# Patient Record
Sex: Female | Born: 1975 | Race: White | Hispanic: No | Marital: Single | Smoking: Never smoker
Health system: Southern US, Community
[De-identification: ages and names within clinical notes are randomized; demographics above are authoritative.]

## PROBLEM LIST (undated history)

## (undated) DIAGNOSIS — J040 Acute laryngitis: Secondary | ICD-10-CM

## (undated) DIAGNOSIS — R51 Headache: Secondary | ICD-10-CM

## (undated) DIAGNOSIS — T7840XA Allergy, unspecified, initial encounter: Secondary | ICD-10-CM

## (undated) DIAGNOSIS — N200 Calculus of kidney: Secondary | ICD-10-CM

## (undated) DIAGNOSIS — J40 Bronchitis, not specified as acute or chronic: Secondary | ICD-10-CM

## (undated) DIAGNOSIS — Z9109 Other allergy status, other than to drugs and biological substances: Secondary | ICD-10-CM

## (undated) HISTORY — DX: Calculus of kidney: N20.0

## (undated) HISTORY — DX: Headache: R51

## (undated) HISTORY — PX: WISDOM TOOTH EXTRACTION: SHX21

## (undated) HISTORY — DX: Allergy, unspecified, initial encounter: T78.40XA

## (undated) HISTORY — PX: UPPER GASTROINTESTINAL ENDOSCOPY: SHX188

---

## 2001-08-03 ENCOUNTER — Other Ambulatory Visit: Admission: RE | Admit: 2001-08-03 | Discharge: 2001-08-03 | Payer: Self-pay | Admitting: Obstetrics and Gynecology

## 2002-08-07 ENCOUNTER — Other Ambulatory Visit: Admission: RE | Admit: 2002-08-07 | Discharge: 2002-08-07 | Payer: Self-pay | Admitting: Obstetrics and Gynecology

## 2002-11-01 HISTORY — PX: NASAL SEPTUM SURGERY: SHX37

## 2003-10-16 ENCOUNTER — Other Ambulatory Visit: Admission: RE | Admit: 2003-10-16 | Discharge: 2003-10-16 | Payer: Self-pay | Admitting: Obstetrics and Gynecology

## 2004-11-01 HISTORY — PX: OTHER SURGICAL HISTORY: SHX169

## 2004-12-04 ENCOUNTER — Ambulatory Visit: Payer: Self-pay | Admitting: Internal Medicine

## 2004-12-11 ENCOUNTER — Ambulatory Visit: Payer: Self-pay | Admitting: Internal Medicine

## 2004-12-16 ENCOUNTER — Other Ambulatory Visit: Admission: RE | Admit: 2004-12-16 | Discharge: 2004-12-16 | Payer: Self-pay | Admitting: Obstetrics and Gynecology

## 2004-12-21 ENCOUNTER — Ambulatory Visit (HOSPITAL_COMMUNITY): Admission: RE | Admit: 2004-12-21 | Discharge: 2004-12-21 | Payer: Self-pay | Admitting: Internal Medicine

## 2004-12-29 ENCOUNTER — Ambulatory Visit: Payer: Self-pay | Admitting: Internal Medicine

## 2005-01-12 ENCOUNTER — Ambulatory Visit: Payer: Self-pay | Admitting: Internal Medicine

## 2005-07-27 ENCOUNTER — Ambulatory Visit: Payer: Self-pay | Admitting: Pulmonary Disease

## 2005-09-16 ENCOUNTER — Ambulatory Visit: Payer: Self-pay | Admitting: Internal Medicine

## 2005-11-24 ENCOUNTER — Ambulatory Visit: Payer: Self-pay | Admitting: Internal Medicine

## 2005-12-02 ENCOUNTER — Other Ambulatory Visit: Admission: RE | Admit: 2005-12-02 | Discharge: 2005-12-02 | Payer: Self-pay | Admitting: Obstetrics & Gynecology

## 2006-04-12 ENCOUNTER — Emergency Department (HOSPITAL_COMMUNITY): Admission: EM | Admit: 2006-04-12 | Discharge: 2006-04-12 | Payer: Self-pay | Admitting: Emergency Medicine

## 2006-08-11 ENCOUNTER — Ambulatory Visit: Payer: Self-pay | Admitting: Internal Medicine

## 2006-12-12 ENCOUNTER — Ambulatory Visit: Payer: Self-pay | Admitting: Internal Medicine

## 2007-01-25 ENCOUNTER — Other Ambulatory Visit: Admission: RE | Admit: 2007-01-25 | Discharge: 2007-01-25 | Payer: Self-pay | Admitting: Obstetrics & Gynecology

## 2007-05-01 ENCOUNTER — Ambulatory Visit: Payer: Self-pay | Admitting: Internal Medicine

## 2007-07-27 ENCOUNTER — Ambulatory Visit: Payer: Self-pay | Admitting: Internal Medicine

## 2007-10-16 ENCOUNTER — Ambulatory Visit: Payer: Self-pay | Admitting: Internal Medicine

## 2007-11-03 ENCOUNTER — Telehealth: Payer: Self-pay | Admitting: Internal Medicine

## 2007-11-13 ENCOUNTER — Ambulatory Visit: Payer: Self-pay | Admitting: Internal Medicine

## 2007-11-30 ENCOUNTER — Encounter: Payer: Self-pay | Admitting: Internal Medicine

## 2008-01-03 ENCOUNTER — Telehealth (INDEPENDENT_AMBULATORY_CARE_PROVIDER_SITE_OTHER): Payer: Self-pay | Admitting: *Deleted

## 2008-01-03 ENCOUNTER — Ambulatory Visit: Payer: Self-pay | Admitting: Internal Medicine

## 2008-01-05 ENCOUNTER — Telehealth (INDEPENDENT_AMBULATORY_CARE_PROVIDER_SITE_OTHER): Payer: Self-pay | Admitting: *Deleted

## 2008-02-19 ENCOUNTER — Other Ambulatory Visit: Admission: RE | Admit: 2008-02-19 | Discharge: 2008-02-19 | Payer: Self-pay | Admitting: Obstetrics & Gynecology

## 2008-04-30 ENCOUNTER — Ambulatory Visit: Payer: Self-pay | Admitting: Internal Medicine

## 2008-06-03 ENCOUNTER — Telehealth (INDEPENDENT_AMBULATORY_CARE_PROVIDER_SITE_OTHER): Payer: Self-pay | Admitting: *Deleted

## 2008-08-20 ENCOUNTER — Telehealth: Payer: Self-pay | Admitting: Internal Medicine

## 2008-09-16 ENCOUNTER — Telehealth (INDEPENDENT_AMBULATORY_CARE_PROVIDER_SITE_OTHER): Payer: Self-pay | Admitting: *Deleted

## 2008-09-16 ENCOUNTER — Ambulatory Visit: Payer: Self-pay | Admitting: Internal Medicine

## 2008-11-04 ENCOUNTER — Ambulatory Visit: Payer: Self-pay | Admitting: Internal Medicine

## 2008-11-28 ENCOUNTER — Telehealth (INDEPENDENT_AMBULATORY_CARE_PROVIDER_SITE_OTHER): Payer: Self-pay | Admitting: *Deleted

## 2008-12-18 ENCOUNTER — Ambulatory Visit: Payer: Self-pay | Admitting: Pulmonary Disease

## 2008-12-18 ENCOUNTER — Telehealth (INDEPENDENT_AMBULATORY_CARE_PROVIDER_SITE_OTHER): Payer: Self-pay | Admitting: *Deleted

## 2009-01-16 ENCOUNTER — Ambulatory Visit: Payer: Self-pay | Admitting: Internal Medicine

## 2009-01-24 ENCOUNTER — Ambulatory Visit: Payer: Self-pay | Admitting: Internal Medicine

## 2009-03-04 ENCOUNTER — Other Ambulatory Visit: Admission: RE | Admit: 2009-03-04 | Discharge: 2009-03-04 | Payer: Self-pay | Admitting: Obstetrics & Gynecology

## 2009-03-25 ENCOUNTER — Ambulatory Visit: Payer: Self-pay | Admitting: Internal Medicine

## 2009-06-26 ENCOUNTER — Telehealth: Payer: Self-pay | Admitting: Internal Medicine

## 2009-07-03 ENCOUNTER — Ambulatory Visit: Payer: Self-pay | Admitting: Internal Medicine

## 2009-11-04 ENCOUNTER — Ambulatory Visit: Payer: Self-pay | Admitting: Internal Medicine

## 2009-11-20 ENCOUNTER — Telehealth (INDEPENDENT_AMBULATORY_CARE_PROVIDER_SITE_OTHER): Payer: Self-pay | Admitting: *Deleted

## 2009-12-08 ENCOUNTER — Ambulatory Visit: Payer: Self-pay | Admitting: Internal Medicine

## 2009-12-08 ENCOUNTER — Telehealth (INDEPENDENT_AMBULATORY_CARE_PROVIDER_SITE_OTHER): Payer: Self-pay | Admitting: *Deleted

## 2010-01-20 ENCOUNTER — Ambulatory Visit: Payer: Self-pay | Admitting: Internal Medicine

## 2010-02-19 ENCOUNTER — Telehealth (INDEPENDENT_AMBULATORY_CARE_PROVIDER_SITE_OTHER): Payer: Self-pay | Admitting: *Deleted

## 2010-03-25 ENCOUNTER — Encounter: Admission: RE | Admit: 2010-03-25 | Discharge: 2010-03-25 | Payer: Self-pay | Admitting: Family Medicine

## 2010-05-06 ENCOUNTER — Telehealth: Payer: Self-pay | Admitting: Internal Medicine

## 2010-09-05 ENCOUNTER — Encounter: Payer: Self-pay | Admitting: Internal Medicine

## 2010-09-05 ENCOUNTER — Encounter: Admission: RE | Admit: 2010-09-05 | Discharge: 2010-09-05 | Payer: Self-pay | Admitting: Family Medicine

## 2010-09-07 ENCOUNTER — Ambulatory Visit: Payer: Self-pay | Admitting: Internal Medicine

## 2010-09-08 ENCOUNTER — Ambulatory Visit: Payer: Self-pay | Admitting: Internal Medicine

## 2010-10-21 ENCOUNTER — Telehealth: Payer: Self-pay | Admitting: Internal Medicine

## 2010-11-22 ENCOUNTER — Encounter: Payer: Self-pay | Admitting: Family Medicine

## 2010-12-03 NOTE — Assessment & Plan Note (Signed)
Summary: per katie//td   Primary Provider/Referring Provider:  Nilda Simmer  CC:  Accute Visit-head congestion, PND-yellow in color; Increased SOB, wheezing, and and Chest tightness.Morgan Wood  History of Present Illness: 01/24/09- Rhinosinusitis, asthmatic bronchits saw Dr Craige Cotta, given Qvar and Nasonex with question of sinusitis causing asthma flare. She has had tighter chest and dyspnea for last 2-3 weeks with some epistaxis. Denies obvious infection. Denies sinus pressure- pain. Min c/o is inability to take comfortable deep breath. FEV1/FVC was 0.84 on office spirometry last visit. She doesn't indicate increased stress. Needs to yawn more.  03/25/09- rhinosinusitis, asthmatic bronchitis Dyspnea is substantially improved since last visit. Went off Yaz around [place]. Noted she was taking a few deep breaths but not like before. has not had return of the distinct dyspnea she had been feeling. Denies chest pain, wheeze or tightness since off Yaz. She would like to go back on, or work with her GYN to find another hormonal management.  November 04, 2009- Rhinosinusitis, asthmatic bronchits Noting more nasal congestion, runny nose and wheeze. Wheezes especialy wih wood smoke from neighbors. She has been tight since Thanksgiving. Postnasal drip makes her nauseated. Xiphoid level discomfort from gastritis. Fever blister- asks Zovirax refill.  December 08, 2009- Rhinosinusitis, asthmatic bronchitis For 2 weeks head congestion, nose blowing, postnasal drip, scratchy throat. Was getting better but over last 2 days worse congestion. Pale yellow. Stomach ok so far. Mild muscle aches. Had flu vax. Had finsihed cefdinir and was feeling better until this most recent flare. For a few months has been noting intermittent ache or sharp pains around left lower costal margin to spine.   Current Medications (verified): 1)  Allergy Vaccine 1:10 Father Gives (W-E) 2)  Epipen 0.3 Mg/0.54ml Devi (Epinephrine) .... Severe Allergic  Reaction 3)  Proventil Hfa 108 (90 Base) Mcg/act Aers (Albuterol Sulfate) .... As Needed 4)  Birth Control .... Take 1 Tablet By Mouth Once A Day 5)  Cefdinir 300 Mg Caps (Cefdinir) .... 2 Daily 6)  Zovirax 400 Mg Tabs (Acyclovir) .Morgan Wood.. 1 Three Times A Day X 5 Days  Allergies (verified): 1)  ! 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  The patient denies fever, weight loss, weight gain, vision loss, decreased hearing, hoarseness, syncope, headaches, hemoptysis, and severe indigestion/heartburn.    Vital Signs:  Patient profile:   35 year old female Height:      62 inches Weight:      117.38 pounds BMI:     21.55 O2 Sat:      100 % on Room air Pulse rate:   76 / minute BP sitting:   108 / 62  (left arm) Cuff size:   regular  Vitals Entered By: Reynaldo Minium CMA (December 08, 2009 4:25 PM)  O2 Flow:  Room air  Physical Exam  Additional Exam:  General: A/Ox3; pleasant and cooperative, NAD, SKIN: no rash, lesions NODES: no lymphadenopathy HEENT: [name]/AT, EOM- WNL, Conjuctivae- clear, PERRLA, TM-WNL, Nose- clear, Throat- clear and wnl, Mellampatti  II NECK: Supple w/ fair ROM, JVD- none, normal  carotid impulses w/o bruits Thyroid-  CHEST: Clear to P&A HEART: RRR, no m/g/r heard ABDOMEN- slender. No palpable HS megaly, not tender. Not shock tender over lower spine. ZOX:WRUE, nl pulses, no edema  NEURO: Grossly intact to observation      Impression & Recommendations:  Problem # 1:  RHINOSINUSITIS, ACUTE  (ICD-461.8)  Viral syndrome with slow resolution. Possibly a second viral infection. I don't think additional antibiotic is indicated now, but that may change. We will give a neb and low dose prednisone taper. Encourage fluids. She had flu vax. I think she is describing a muxsculoskeltal pain- to be managed symptomatically, around her left lower rib cage. Her updated medication list for this problem includes:    Cefdinir 300 Mg Caps (Cefdinir) .Morgan Wood... 2 daily  Medications Added to Medication List This Visit: 1)  Prednisone 10 Mg Tabs (Prednisone) .... 3 x 2 days, 2 x 2 days, 1 x 2 days  Other Orders: Est. Patient Level III (45409)  Patient Instructions: 1)  Keep scheduled appointment- ealier or call as needed 2)  Fluids and rest, tylenol for aches. A heating pad might help your sore rib area. 3)  Script sent for prednisone taper 4)  neb neo nasal Prescriptions: PREDNISONE 10 MG TABS (PREDNISONE) 3 x 2 days, 2 x 2 days, 1 x 2 days  #12 x 0   Entered and Authorized by:   Waymon Budge MD   Signed by:   Waymon Budge MD on 12/08/2009   Method used:   [id number]       [address]       [place], [place]  [address]       Ph: [phone] or [phone]       Fax: [phone]   RxID:   [id number]

## 2010-12-03 NOTE — Progress Notes (Signed)
Summary: sob  Phone Note Call from Patient Call back at [phone]   Caller: Patient Call For: young Reason for Call: Talk to Nurse Summary of Call: pt. c/o sob since Sunday.  Cough, nose congested, real jittery. Initial call taken by: Eugene Gavia,  November 20, 2009 8:13 AM  Follow-up for Phone Call        [facility].Michel Bickers CMA  November 20, 2009 9:18 AM  The pt c/o increased sob, "wheezy" cough and chest disconfort since Sunday, 11/16/09. She denies any radiating chest pain or fever.The pt says she never took the Cefdinirgiven to her at the last OV. Please advise. Follow-up by: Michel Bickers CMA,  November 20, 2009 9:33 AM  Additional Follow-up for Phone Call Additional follow up Details #1::        Per CY, try mucinex DM for cough and to thin mucus.  Crystal Jones RN  November 20, 2009 10:30 AM  Pt is aware of CDY recs and will call if her sxs do not improve or if they get worse. She was instructed to go to the ER if her sob worsens.  Additional Follow-up by: Michel Bickers CMA,  November 20, 2009 10:51 AM

## 2010-12-03 NOTE — Progress Notes (Signed)
Summary: PRESCRIPT  Phone Note Call from Patient Call back at [phone]   Caller: Patient Call For: Morgan Wood Summary of Call: NEED REFILL FOR  1CC SYRINGES FOR ALLERGY SHOTS ALSO NEED PRESCRIPT FOR EFIE PEN [facility] [address] Initial call taken by: Rickard Patience,  February 19, 2010 8:49 AM  Follow-up for Phone Call        rx sent to pharmacy.  pt aware.  Aundra Millet Reynolds LPN  February 19, 2010 9:27 AM     New/Updated Medications: TUBERCULIN SYRINGE 25G X 5/8" 1 ML MISC (TUBERCULIN-ALLERGY SYRINGES) use as directed for allergy vaccine Prescriptions: EPIPEN 0.3 MG/0.3ML DEVI (EPINEPHRINE) severe allergic reaction  #1 x 11   Entered by:   Arman Filter LPN   Authorized by:   Waymon Budge MD   Signed by:   Arman Filter LPN on 53/66/4403   Method used:   [facility]        [facility] [id number]       [address]       [place], [place]  [address]       Ph: [phone] or [phone]       Fax: [phone]   RxID:   [id number] TUBERCULIN SYRINGE 25G X 5/8" 1 ML MISC (TUBERCULIN-ALLERGY SYRINGES) use as directed for allergy vaccine  #100 x 11   Entered by:   Arman Filter LPN   Authorized by:   Waymon Budge MD   Signed by:   Arman Filter LPN on 76/28/3151   Method used:   [facility]        [id number]       [address]       [place], [place]  [address]       Ph: [phone] or [phone]       Fax: [phone]   RxID:   [id number]

## 2010-12-03 NOTE — Assessment & Plan Note (Signed)
Summary: 12 months/apc   Primary Provider/Referring Provider:  Nilda Simmer  CC:  Pt here for yearly follow up and Rx refills. Pt c/o nasal congestion and runny nose, wheezing, and and headaches intermittent worse when neighbors "burn".  History of Present Illness: 01/24/09- Rhinosinusitis, asthmatic bronchits saw Dr Craige Cotta, given Qvar and Nasonex with question of sinusitis causing asthma flare. She has had tighter chest and dyspnea for last 2-3 weeks with some epistaxis. Denies obvious infection. Denies sinus pressure- pain. Min c/o is inability to take comfortable deep breath. FEV1/FVC was 0.84 on office spirometry last visit. She doesn't indicate increased stress. Needs to yawn more.  03/25/09- rhinosinusitis, asthmatic bronchitis Dyspnea is substantially improved since last visit. Went off Yaz around [place]. Noted she was taking a few deep breaths but not like before. has not had return of the distinct dyspnea she had been feeling. Denies chest pain, wheeze or tightness since off Yaz. She would like to go back on, or work with her GYN to find another hormonal management.  November 04, 2009- Rhinosinusitis, asthmatic bronchits Noting more nasal congestion, runny nose and wheeze. Wheezes especialy wih wood smoke from neighbors. She has been tight since Thanksgiving. Postnasal drip makes her nauseated. Xiphoid level discomfort from gastritis. Fever blister- asks Zovirax refill.   Current Medications (verified): 1)  Allergy Vaccine 1:10 Father Gives (W-E) 2)  Epipen 0.3 Mg/0.7ml Devi (Epinephrine) .... Severe Allergic Reaction 3)  Proventil Hfa 108 (90 Base) Mcg/act Aers (Albuterol Sulfate) .... As Needed 4)  Birth Control .... Take 1 Tablet By Mouth Once A Day  Allergies (verified): 1)  ! 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  The patient denies anorexia, fever, weight loss, weight gain, vision loss, decreased hearing, hoarseness, chest pain, syncope, dyspnea on exertion, peripheral edema, headaches, hemoptysis, and severe indigestion/heartburn.    Vital Signs:  Patient profile:   35 year old female Height:      62 inches Weight:      118.13 pounds BMI:     21.68 O2 Sat:      99 % on Room air Pulse rate:   83 / minute BP sitting:   106 / 70  (left arm) Cuff size:   regular  Vitals Entered By: Zackery Barefoot CMA (November 04, 2009 4:19 PM)  O2 Flow:  Room air CC: Pt here for yearly follow up and Rx refills. Pt c/o nasal congestion and runny nose, wheezing, and headaches intermittent worse when neighbors "burn" Comments Medications reviewed with patient Zackery Barefoot CMA  November 04, 2009 4:20 PM    Physical Exam  Additional Exam:  General: A/Ox3; pleasant and cooperative, NAD, SKIN: no rash, lesions NODES: no lymphadenopathy HEENT: [place]/AT, EOM- WNL, Conjuctivae- clear, PERRLA, TM-WNL, Nose- clear, Throat- clear and wnl, Mellampatti  II NECK: Supple w/ fair ROM, JVD- none, normal carotid impulses w/o bruits Thyroid-  CHEST: Clear to P&A HEART: RRR, no m/g/r heard ABDOMEN GNF:AOZH, nl pulses, no edema  NEURO: Grossly intact to observation      Impression & Recommendations:  Problem # 1:  RHINOSINUSITIS, ACUTE (ICD-461.8)  Recurrent low grade sinusitis. 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 The following medications were removed from the medication list:    Nasonex 50 Mcg/act Susp (Mometasone furoate) .Marland Kitchen... 2 sprays once daily Her updated  medication list for this problem includes:    Cefdinir 300 Mg Caps (Cefdinir) .Marland Kitchen... 2 daily  Problem # 2:  ASTHMATIC BRONCHITIS, ACUTE (ICD-466.0)  Not wheezing overtly now, but prone to worsen easily with viral infections Her updated medication list for this problem includes:    Proventil Hfa 108 (90 Base) Mcg/act Aers (Albuterol sulfate) .Marland Kitchen... As needed    Cefdinir 300 Mg Caps (Cefdinir) .Marland Kitchen... 2 daily  Medications Added to Medication List This Visit: 1)  Birth Control  .... Take 1 tablet by mouth once a day 2)  Cefdinir 300 Mg Caps (Cefdinir) .... 2 daily 3)  Zovirax 400 Mg Tabs (Acyclovir) .Marland Kitchen.. 1 three times a day x 5 days  Other Orders: Est. Patient Level II (81191)  Patient Instructions: 1)  Please schedule a follow-up appointment in 6 months. 2)  Sample Omnaris nasal spray: 2 puffs each nostril every night at bedtime 3)  Script cefdinir antibiotic to hold 4)  Scripts for Epipen and Zovirax 5)  Try the Neti pot early and often 6)  Try Sudafed-PE as a decongestant when needed. You can add mucinex if you need to this mucus Prescriptions: ZOVIRAX 400 MG TABS (ACYCLOVIR) 1 three times a day x 5 days  #15 x prn   Entered and Authorized by:   Waymon Budge MD   Signed by:   Waymon Budge MD on 11/04/2009   Method used:   [facility]        [id number]       [address]       [place], [place]  [address]       Ph: [phone] or [phone]       Fax: [phone]   RxID:   [id number] EPIPEN 0.3 MG/0.3ML DEVI (EPINEPHRINE) severe allergic reaction  #1 x prn   Entered and Authorized by:   Waymon Budge MD   Signed by:   Waymon Budge MD on 11/04/2009   Method used:   [facility]ically [facility]        [facility] [id number]       [address]       [place], [place]  [address]       Ph: [phone] or [phone]       Fax: [phone]   RxID:   [id number] CEFDINIR 300 MG CAPS  (CEFDINIR) 2 daily  #14 x 0   Entered and Authorized by:   Waymon Budge MD   Signed by:   Waymon Budge MD on 11/04/2009   Method used:   Print then Give to Patient   RxID:   [id number] PROVENTIL HFA 108 (90 BASE) MCG/ACT AERS (ALBUTEROL SULFATE) as needed  #1 x prn   Entered and Authorized by:   Waymon Budge MD   Signed by:   Waymon Budge MD on 11/04/2009   Method used:   [facility]        [id number]       [address]       [facility]  [place], [place]  [address]       Ph: [phone] or [phone]       Fax: [phone]   RxID:   [id number]    Immunization History:  Influenza Immunization History:    Influenza:  historical (09/01/2009)

## 2010-12-03 NOTE — Progress Notes (Signed)
Summary: chest pains  Phone Note Call from Patient Call back at [phone]   Caller: Patient Call For: young Summary of Call: pt still having breathing problem pain on left side of chest completed antibiotics [facility] [address] Initial call taken by: Rickard Patience,  December 08, 2009 8:18 AM  Follow-up for Phone Call        Pt c/o head congestion, PND-clear to yellow, S.O.B worse in the evening, wheezing, chest tight, left side pain radiating to back (intermittent-dull to sharp x 1 month). Pt states when taking abx (Cefdinir 300mg ) she did have some relief. Pt feels she needs to be seen by CY. Please advise. Thanks. Zackery Barefoot CMA  December 08, 2009 10:28 AM   Medication allergies:  1)  ! Zithromax Z-Pak (Azithromycin)   Additional Follow-up for Phone Call Additional follow up Details #1::        Please have pt come in to see CDY today at [phone] here at 4pm. Reynaldo Minium CMA  December 08, 2009 1:46 PM   pt aware of appt and will be here at 4pm Additional Follow-up by: Philipp Deputy CMA,  December 08, 2009 2:08 PM

## 2010-12-03 NOTE — Progress Notes (Signed)
Summary: nos appt  Phone Note Call from Patient   Caller: [email or web address]  Call For: Sara Selvidge Summary of Call: Rsc nos from 7/5 to 11/7 @ 4p. Initial call taken by: Darletta Moll,  May 06, 2010 9:35 AM

## 2010-12-03 NOTE — Assessment & Plan Note (Signed)
Summary: rov/jd   Primary Provider/Referring Provider:  Laurann Montana, MD  CC:  follow up visit-allergies.  History of Present Illness: November 04, 2009- Rhinosinusitis, asthmatic bronchits Noting more nasal congestion, runny nose and wheeze. Wheezes especialy wih wood smoke from neighbors. She has been tight since Thanksgiving. Postnasal drip makes her nauseated. Xiphoid level discomfort from gastritis. Fever blister- asks Zovirax refill.  December 08, 2009- Rhinosinusitis, asthmatic bronchitis For 2 weeks head congestion, nose blowing, postnasal drip, scratchy throat. Was getting better but over last 2 days worse congestion. Pale yellow. Stomach ok so far. Mild muscle aches. Had flu vax. Had finsihed cefdinir and was feeling better until this most recent flare. For a few months has been noting intermittent ache or sharp pains around left lower costal margin to spine.  September 07, 2010- Rhinosinusitis, asthmatic bronchitis Nurse-CC: follow up visit-allergies Needs flu vax and refill on her vaccine syringes. Continues allergy vaccine successfully. Does still recognize snifing and postnasal drip. Having persistent pain around left lower ribs to back and has been evaluated for it w/o reolution. Had persistent shortness of breath and urgent care gave her Qvar. She thinks she was anxious because chest hurt.  Dropped off Qvar and wants to discuss. Awakened one night last week by chest pain. Seemed to get relief from prednisone for awhile. Had MRI chest for this chest discomfort 2 days ago. MRI WNL    Preventive Screening-Counseling & Management  Alcohol-Tobacco     Smoking Status: never  Current Medications (verified): 1)  Allergy Vaccine 1:10 Father Gives (W-E) 2)  Epipen 0.3 Mg/0.41ml Devi (Epinephrine) .... Severe Allergic Reaction 3)  Proventil Hfa 108 (90 Base) Mcg/act Aers (Albuterol Sulfate) .... As Needed 4)  Birth Control .... Take 1 Tablet By Mouth Once A Day 5)  Zovirax 400 Mg  Tabs (Acyclovir) .Marland Kitchen.. 1 Three Times A Day X 5 Days 6)  Tuberculin Syringe 25g X 5/8" 1 Ml Misc (Tuberculin-Allergy Syringes) .... Use As Directed For Allergy Vaccine  Allergies (verified): 1)  ! 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  The patient denies shortness of breath with activity, shortness of breath at rest, productive cough, non-productive cough, coughing up blood, irregular heartbeats, acid heartburn, indigestion, loss of appetite, weight change, abdominal pain, difficulty swallowing, sore throat, tooth/dental problems, headaches, ear ache, rash, change in color of mucus, and fever.    Vital Signs:  Patient profile:   35 year old female Height:      62 inches Weight:      121.13 pounds BMI:     22.24 O2 Sat:      100 % on Room air Pulse rate:   77 / minute BP sitting:   110 / 72  (left arm) Cuff size:   regular  Vitals Entered By: Reynaldo Minium CMA (September 07, 2010 4:18 PM)  O2 Flow:  Room air CC: follow up visit-allergies   Physical Exam  Additional Exam:  General: A/Ox3; pleasant and cooperative, NAD, SKIN: no rash, lesions NODES: no lymphadenopathy HEENT: [place]/AT, EOM- WNL, Conjuctivae-  clear, PERRLA, TM-WNL, Nose- crusting mucus and turbinate edema, Throat- clear and wnl, Mallampati II NECK: Supple w/ fair ROM, JVD- none, normal carotid impulses w/o bruits Thyroid-  CHEST: Clear to P&A, no rubs cough or wheeze HEART: RRR, no m/g/r heard ABDOMEN- slender. No palpable HS megaly,  not tender.  ZOX:WRUE, nl pulses, no edema . Tends to sit slumped. NEURO: Grossly intact to observation      Impression & Recommendations:  Problem # 1:  RHINOSINUSITIS, ACUTE (ICD-461.8)  Ongoing rhinitis. We will continue allergy vaccine and let her try an antihistamine nasal spray since she couldn't make steroid inhalers work for her.  The following medications were removed from the medication list:    Cefdinir 300 Mg Caps (Cefdinir) .Marland Kitchen... 2 daily Her updated medication list for this problem includes:    Astepro 0.15 % Soln (Azelastine hcl) .Marland Kitchen... 1-2 puffs each nostril twice daily as needed antihistamine  Problem # 2:  ASTHMATIC BRONCHITIS, ACUTE (ICD-466.0)  Intermittent asthma or asthmatic bronchitis now clear. I don't know the basis for her c/o chest pain. Her habitual slumped sitting posture is suggestive, but the MRI doesn't recognize any obvious defect that would relate to this explanation. The following medications were removed from the medication list:    Cefdinir 300 Mg Caps (Cefdinir) .Marland Kitchen... 2 daily Her updated medication list for this problem includes:    Proventil Hfa 108 (90 Base) Mcg/act Aers (Albuterol sulfate) .Marland Kitchen... As needed  Medications Added to Medication List This Visit: 1)  Astepro 0.15 % Soln (Azelastine hcl) .Marland Kitchen.. 1-2 puffs each nostril twice daily as needed antihistamine  Other Orders: Est. Patient Level III (45409) Flu Vaccine 75yrs + (81191) Admin 1st Vaccine (47829)  Patient Instructions: 1)  Please schedule a follow-up appointment in 6 months. 2)  Refill script for allergy syringes 3)  Sample/ script to try Astepro antihistamine nasal spray 4)   1-2 puffs in each nostril two times a day as needed  5)  Flu vax Prescriptions: ASTEPRO 0.15 % SOLN (AZELASTINE HCL) 1-2 puffs each nostril twice daily as needed antihistamine  #1 x prn    Entered and Authorized by:   Waymon Budge MD   Signed by:   Waymon Budge MD on 09/07/2010   Method used:    Print then Give to Patient   RxID:   [id number] TUBERCULIN SYRINGE 25G X 5/8" 1 ML MISC (TUBERCULIN-ALLERGY SYRINGES) use as directed for allergy vaccine  #100 x 11   Entered by:   Reynaldo Minium CMA   Authorized by:   Waymon Budge MD   Signed by:   Reynaldo Minium CMA on 09/07/2010   Method used:   [id number]       [address]       [place], [place]  [address]       Ph: [phone] or [phone]       Fax: [phone]   RxID:   [id number]    Immunizations Administered:  Influenza Vaccine # 1:    Vaccine Type: Fluvax 3+    Site: left deltoid    Mfr: [employer]is    Dose: 0.5 ml    Route: IM    Given by: Reynaldo Minium CMA    Exp. Date: 04/02/2011    Lot #: [id number]    VIS given: 05/26/10 version given September 07, 2010.  Flu Vaccine Consent  Questions:    Do you have a history of severe allergic reactions to this vaccine? no    Any prior history of allergic reactions to egg and/or gelatin? no    Do you have a sensitivity to the preservative Thimersol? no    Do you have a past history of Guillan-Barre Syndrome? no    Do you currently have an acute febrile illness? no    Have you ever had a severe reaction to latex? no    Vaccine information given and explained to patient? yes    Are you currently pregnant? no

## 2010-12-03 NOTE — Progress Notes (Signed)
Summary: sinus issues  Phone Note Call from Patient Call back at [phone]   Caller: Patient Call For: young Reason for Call: Talk to Nurse Summary of Call: Patient c/o sinus drainage, sore throat, cough that is keeping her up at night.  She has tried alcheseltzer cough/sinus and netty pot with no relief.  Asking for any other suggestions to help with symptoms.  [facility] [address] Initial call taken by: Lehman Prom,  October 21, 2010 8:54 AM  Follow-up for Phone Call        [facility].Michel Bickers [facility]  October 21, 2010 9:13 AM  Patient returned a call from Triage. She can be reached at [phone] Follow-up by: Vedia Coffer,  October 21, 2010 9:17 AM  Additional Follow-up for Phone Call Additional follow up Details #1::        Pt c/o sore throat, cough w/ yellow sputum, wheezing, sinus drainage for 3 days. Pt has tried using her Netti Agricultural engineer cough and sinus OTC but this does not help. Pls advise. Allergies (verified):  1)  ! Zithromax Z-Pak (Azithromycin) Additional Follow-up by: Michel Bickers CMA,  October 21, 2010 9:29 AM    Additional Follow-up for Phone Call Additional follow up Details #2::    Per CDY-okay to give patient Augmentin 500mg  #14 take 1 by mouth two times a day no refills.Reynaldo Minium CMA  October 21, 2010 10:00 AM   Rx sent. pt aware. Carron Curie CMA  October 21, 2010 10:03 AM   New/Updated Medications: AUGMENTIN 500-125 MG TABS (AMOXICILLIN-POT CLAVULANATE) Take 1 tablet by mouth two times a day Prescriptions: AUGMENTIN 500-125 MG TABS (AMOXICILLIN-POT CLAVULANATE) Take 1 tablet by mouth two times a day  #14 x 0   Entered by:   Carron Curie CMA   Authorized by:   Waymon Budge MD   Signed by:   Carron Curie CMA on 10/21/2010   Method used:   [facility] to        [facility]  [address] [id number]       [address]       [place], [place]  [address]       Ph: [phone] or [phone]       Fax:  [phone]   RxID:   [id number]

## 2011-03-03 ENCOUNTER — Encounter: Payer: Self-pay | Admitting: Internal Medicine

## 2011-03-08 ENCOUNTER — Encounter: Payer: Self-pay | Admitting: Internal Medicine

## 2011-03-08 ENCOUNTER — Ambulatory Visit (INDEPENDENT_AMBULATORY_CARE_PROVIDER_SITE_OTHER): Payer: BC Managed Care – PPO | Admitting: Internal Medicine

## 2011-03-08 DIAGNOSIS — J209 Acute bronchitis, unspecified: Secondary | ICD-10-CM

## 2011-03-08 DIAGNOSIS — J301 Allergic rhinitis due to pollen: Secondary | ICD-10-CM

## 2011-03-08 DIAGNOSIS — J018 Other acute sinusitis: Secondary | ICD-10-CM

## 2011-03-08 MED ORDER — ALBUTEROL SULFATE HFA 108 (90 BASE) MCG/ACT IN AERS: 2.0000 | INHALATION_SPRAY | Freq: Four times a day (QID) | RESPIRATORY_TRACT | Status: DC | PRN

## 2011-03-08 NOTE — Assessment & Plan Note (Signed)
Allergy vaccine helps.

## 2011-03-08 NOTE — Progress Notes (Signed)
  Subjective:    Patient ID: Morgan Wood, female    DOB: 11/26/1975, 35 y.o.   MRN: [id number]  HPI 03/08/11- 6 yoF  Never smoker, followed for rhinosinusitis and asthmatic bronchitis.  Last here Sep 07, 2010. She got through the winter well. She credits her allergy shots for an unusually good spring, with little stuffiness. Taking only an occasional Z pak  if she plans to be outside.  She uses Qvar in stretches, but complains of cost. Asks about using her rescue inhaler instead.  Recognizes some of her chest tightness is due to anxiety.   Review of Systems See HPI Constitutional:   No weight loss, night sweats,  Fevers, chills, fatigue, lassitude. HEENT:   No headaches,  Difficulty swallowing,  Tooth/dental problems,  Sore throat,                No sneezing, itching, ear ache, nasal congestion, post nasal drip,   CV:  No chest pain,  Orthopnea, PND, swelling in lower extremities, anasarca, dizziness, palpitations  GI  No heartburn, indigestion, abdominal pain, nausea, vomiting, diarrhea, change in bowel habits, loss of appetite  Resp: No shortness of breath with exertion or at rest.  No excess mucus, no productive cough,  No non-productive cough,  No coughing up of blood.  No change in color of mucus.  No wheezing.   Skin: no rash or lesions.  GU: no dysuria, change in color of urine, no urgency or frequency.  No flank pain.  MS:  No joint pain or swelling.  No decreased range of motion.  No back pain.  Psych:  No change in mood or affect. No depression or anxiety.  No memory loss.      Objective:   Physical Exam General- Alert, Oriented, Affect-appropriate, Distress- none acute  Trim , comfortable appearing  Skin- rash-none, lesions- none, excoriation- none  Lymphadenopathy- none  Head- atraumatic  Eyes- Gross vision intact, PERRLA, conjunctivae clear  secretions  Ears- Hearing, canals, Tm ,- normal  Nose- Clear, No- septal dev, mucus, polyps, erosion, perforation    Throat- Mallampati II , mucosa clear , drainage- none, tonsils- atrophic  Neck- flexible , trachea midline, no stridor , thyroid nl, carotid no bruit  Chest - symmetrical excursion , unlabored     Heart/CV- RRR , no murmur , no gallop  , no rub, nl s1 s2                     - JVD- none , edema- none, stasis changes- none, varices- none     Lung- clear to P&A, wheeze- none, cough- none , dullness-none, rub- none     Chest wall-  Abd- tender-no, distended-no, bowel sounds-present, HSM- no  Br/ Gen/ Rectal- Not done, not indicated  Extrem- cyanosis- none, clubbing, none, atrophy- none, strength- nl  Neuro- grossly intact to observation         Assessment & Plan:

## 2011-03-08 NOTE — Patient Instructions (Signed)
Instead of Qvar, which is a maintenance med intended for daily use, try  Using your rescue albuterol inhaler just as needed. This can include use before you work outdoors or exercise if you need.   Script for proventil sent to drug store

## 2011-03-08 NOTE — Assessment & Plan Note (Signed)
Instead of spotty use of Qvar, she will do better to use a rescue inhaler occasionally as discussed. We will update her script.

## 2011-03-14 ENCOUNTER — Encounter: Payer: Self-pay | Admitting: Internal Medicine

## 2011-03-14 DIAGNOSIS — J301 Allergic rhinitis due to pollen: Secondary | ICD-10-CM | POA: Insufficient documentation

## 2011-03-16 NOTE — Assessment & Plan Note (Signed)
[facility]                             PULMONARY OFFICE NOTE   NECHELLE, PETRIZZO                      MRN:          [id number]  DATE:07/27/2007                            DOB:          [birth year]    PROBLEM:  1. Allergic asthma.  2. Allergic rhinitis.  3. Headache.   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  Two weeks now of what she considers seasonal head  congestion.  This morning some sore throat and chest congestion.  No  definite fever yet but she thinks she is headed towards bronchitis and a  flare of her asthma.  Nothing yet purulent.  Her father continues to  give her allergy vaccine at 1:10 with no problems.  She has an EpiPen  she has never needed.   MEDICATION:  1. PRN albuterol and Nasonex.  2. EpiPen available.   DRUG INTOLERANT:  Doxycycline with nausea.   OBJECTIVE:  Weight 107.8, BP 98/70, pulse 76, room air saturation 100%.  Pharynx is red.  Dry cough.  Without wheeze.  No adenopathy.  Mild nasal  congestion.   IMPRESSION:  Upper respiratory infection/viral  syndrome/bronchitis/asthma.   PLAN:  1. Z-Pak, fluids, supportive care and rest.  2. Prednisone 40 mg daily x3 days with steroid talk.  3. Schedule return 1 year, earlier p.r.n.     Clinton D. Maple Hudson, MD, Tonny Bollman, FACP  Electronically Signed    CDY/MedQ  DD: 07/31/2007  DT: 07/31/2007  Job #: [id number]   cc:   Nilda Simmer, M.D.

## 2011-03-16 NOTE — Assessment & Plan Note (Signed)
[facility]                         GASTROENTEROLOGY OFFICE NOTE   Morgan Wood, Morgan Wood                      MRN:          [id number]  DATE:11/13/2007                            DOB:          22-Oct-1976    Ms. Tomlin is a very nice 35 year old tall white female.  She is an  Tourist information centre manager who, for the past 4 weeks, has been  constipated.  Her usual bowel habits are 1 bowel movement a day.  For  the past 4 weeks, she has not gone very often and has to strain.  She  has developed left lower quadrant abdominal pain and discomfort, and she  has seen blood on the toilet tissue on 3 or 4 separate occasions.  The  last bleeding occurred about 2 weeks ago.  She denies changing any of  her eating habits or her lifestyle.  She usually has a granola bar in  the morning.  She brings her own lunch to school and eats regular supper  with balanced meal in the evenings.  Her weight has been stable.  She is  very physically active, playing on a basketball team 3 times a week and  softball team twice a week.  She has used over-the-counter laxatives  recently, as well as a stool softener.  There is no family history of  inflammatory bowel disease or colon cancer, or even constipation.  She  has never really had serious issues with her bowel habits.   MEDICATIONS:  1. Allergy shots once a week.  2. Birth control pills.  3. She also uses inhaler on p.r.n. basis.   PAST HISTORY:  Asthmatic bronchitis as a child.  Allergies.   FAMILY HISTORY:  Negative for colon cancer.  Positive for diabetes in  grandmother.  Both grandfathers died of heart attacks.   SOCIAL HISTORY:  Single.  No children.  4 years of college.  She works  as a Runner, broadcasting/film/video.  She does not smoke and does not drink alcohol.   REVIEW OF SYSTEMS:  Positive for allergies and muscle cramps.   PHYSICAL EXAM:  Blood pressure 112/64, pulse 76, and weight 107 pounds.  She appeared healthy and in no  distress, rather thin.  Sclerae not icteric.  NECK:  Supple.  LUNGS:  Clear to auscultation.  COR:  Normal S1, normal S2.  ABDOMEN:  Scaphoid, thin, with palpable stool in the left colon.  Moderate discomfort on palpation of the left middle quadrant.  Right  upper and lower quadrants were unremarkable.  RECTAL:  Anoscopic exam reveals normal-appearing anal area, normal  rectal tone.  No evidence of anal fissure.  No hemorrhoids.  Stool was  strongly Hemoccult positive.  Stool specimen was obtained after the  second insertion of the anoscope.   IMPRESSION:  52. A 35 year old white female with hematochezia, but normal anoscopic      exam except for heme-positive stool today, which could be related      to trauma with the anoscope.  Her description of the bleeding and      the circumstances are consistent with anal fissure.  I  do not see      any anal fissure today, but the last episode of bleeding was about      2 to 3 weeks ago.  2. Functional constipation, which may be related to inadequate fluid      intake or enough liquids, or enough fiber as well.   PLAN:  1. Booklet on high fiber diet.  The patient should follow instructions      to obtain at least 15 to 20 g of fiber a day.  2. MiraLax 9 to 10 g 2 to 3 times a week as a laxative.  3. Continue physical activity and increase fluid intake.  4. Analpram cream 2.5% to use on a p.r.n. basis for rectal irritation.  5. I would like to see her in the next 6 to 8 weeks.  We will recheck      the stool for blood and decide if she needs a colonoscopy.     Hedwig Morton. Juanda Chance, MD  Electronically Signed    DMB/MedQ  DD: 11/13/2007  DT: 11/13/2007  Job #: [id number]   cc:   M. Leda Quail, MD

## 2011-03-16 NOTE — Assessment & Plan Note (Signed)
[facility]                         GASTROENTEROLOGY OFFICE NOTE   SHAKEDRA, BEAM                      MRN:          [id number]  DATE:01/03/2008                            DOB:          12/08/1975    The patient is a 35 year old white female who had an anal fissure,  rectal bleeding, and constipation.  We saw her on November 13, 2007, and  put her on MiraLax, high fiber diet, and Analpram cream whose symptoms  have resolved.  She is now having regular bowel movements.  She denies  any rectal bleeding or rectal pain.  I do not feel there is a need for  further GI evaluation.   PLAN:  1. Continue high fiber diet.  2. Continue MiraLax on p.r.n. basis.  3. Return if her symptoms become refractory to the treatment.  At this      point we are not planning on colonoscopy.     Hedwig Morton. Juanda Chance, MD  Electronically Signed    DMB/MedQ  DD: 01/03/2008  DT: 01/03/2008  Job #: [id number]   cc:   M. Leda Quail, MD

## 2011-03-19 NOTE — Assessment & Plan Note (Signed)
 [facility]ALTHC[facility]                             PULMONARY OFFICE NOTE   Morgan Wood, Morgan Wood                      MRN:          [id number]  DATE:12/12/2006                            DOB:          1976-01-05    PROBLEM LIST:  1. Allergic asthma.  2. Allergic rhinitis.  3. Headaches.   HISTORY:  Nasal congestion off and on, sometimes with green nasal  discharge and a little bit of blood.  Some of her headaches are frontal  and could reflect sinusitis.  She has felt a little wheeze and chest  tightness.  She takes Zyrtec if she is visiting a place where she  expects there will be cats because she considers herself very sensitive  to this.  She continues allergy vaccine at 1:10 with injections given by  her father.  We again reviewed risks, benefit and goals including a  discussion of issues related to administration outside of a medical  office, anaphylaxis and epinephrine.  We refilled her epinephrine pen.   MEDICATIONS:  1. Allergy injections once per week.  2. Rescue albuterol.  3. Nasonex.  4. Epinephrine pen.   DRUG INTOLERANCE:  To DOXYCYCLINE which causes GI upset.   OBJECTIVE:  Weight 114 pounds.  BP 98/60, pulse regular 66.  Room air  saturation 98%.  There is moderate nasal congestion and turbinate edema  with white mucus in the nose and little in the posterior pharynx but no  erythema.  Voice quality is normal.  I find no adenopathy.  Chest is  quiet and clear.  Heart sounds are normal.   IMPRESSION:  1. Allergic rhinitis with a recent upper respiratory tract infection      and possibly sinusitis.  2. Mild intermittent asthma.   PLAN:  1. Vaccine discussion as above.  2. Augmentin 875 mg b.i.d. for 7 days.  3. Saline nasal lavage.  4. Refilled albuterol rescue inhaler.  5. Schedule return in one year but earlier p.r.n.     Clinton D. Maple Hudson, MD, Tonny Bollman, FACP  Electronically Signed    CDY/MedQ  DD: 12/18/2006  DT: 12/19/2006  Job  #: [id number]   cc:   Nilda Simmer, M.D.

## 2011-04-12 ENCOUNTER — Telehealth: Payer: Self-pay | Admitting: Internal Medicine

## 2011-04-12 NOTE — Telephone Encounter (Signed)
I'm very sorry but I don't know about this. I don't remember a form. I don't find anything in my paperwork folders.  We would usually have dealt with it and sent it along as requested. If we lost it i can only apologize and ask her to send another, or explain what she needs [place] to do. Would it have been something sent to the allergy lab??

## 2011-04-12 NOTE — Telephone Encounter (Signed)
Spoke with pt.  She states that when she was here last on 03/08/11 gave CDY a form to fill out regarding allergy med.  She is upset b/c she states that she should have received something by now. Dr Maple Hudson pls advise the status of this form, thanks!

## 2011-04-13 ENCOUNTER — Ambulatory Visit (INDEPENDENT_AMBULATORY_CARE_PROVIDER_SITE_OTHER): Payer: BC Managed Care – PPO

## 2011-04-13 DIAGNOSIS — J309 Allergic rhinitis, unspecified: Secondary | ICD-10-CM

## 2011-04-13 NOTE — Telephone Encounter (Signed)
LMTCB

## 2011-04-13 NOTE — Telephone Encounter (Signed)
Spoke with pt and advised of response per CDY- pt very upset. I put her on hold and went to check with Morgan Wood. Phone call transferred to [name] per [place] request. Will hold in triage until I hear back from [name].

## 2011-09-06 ENCOUNTER — Ambulatory Visit (INDEPENDENT_AMBULATORY_CARE_PROVIDER_SITE_OTHER): Payer: BC Managed Care – PPO | Admitting: Internal Medicine

## 2011-09-06 ENCOUNTER — Encounter: Payer: Self-pay | Admitting: Internal Medicine

## 2011-09-06 VITALS — BP 118/62 | HR 72 | Ht 63.0 in | Wt 120.8 lb

## 2011-09-06 DIAGNOSIS — J018 Other acute sinusitis: Secondary | ICD-10-CM

## 2011-09-06 DIAGNOSIS — J301 Allergic rhinitis due to pollen: Secondary | ICD-10-CM

## 2011-09-06 MED ORDER — ALBUTEROL SULFATE HFA 108 (90 BASE) MCG/ACT IN AERS: 2.0000 | INHALATION_SPRAY | RESPIRATORY_TRACT | Status: DC | PRN

## 2011-09-06 MED ORDER — EPINEPHRINE 0.3 MG/0.3ML IJ DEVI: 0.3000 mg | Freq: Once | INTRAMUSCULAR | Status: DC

## 2011-09-06 MED ORDER — VALACYCLOVIR HCL 1 G PO TABS: ORAL_TABLET | ORAL | Status: AC

## 2011-09-06 NOTE — Progress Notes (Signed)
Patient ID: Morgan Wood, female    DOB: 1976/03/26, 35 y.o.   MRN: [id number]  HPI 03/08/11- 64 yoF  Never smoker, followed for rhinosinusitis and asthmatic bronchitis.  Last here Sep 07, 2010. She got through the winter well. She credits her allergy shots for an unusually good spring, with little stuffiness. Taking only an occasional Z pak  if she plans to be outside.  She uses Qvar in stretches, but complains of cost. Asks about using her rescue inhaler instead.  Recognizes some of her chest tightness is due to anxiety.   09/06/11- 35 yoF  Never smoker, followed for rhinosinusitis and asthmatic bronchitis.  She should cough one head cold earlier in the summer but since then has felt well. 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 She is doing well now with allergy vaccine at 1:10 and she is not interested in making any changes. She does use Neti pot when needed.   Review of Systems See HPI Constitutional:   No-   weight loss, night sweats, fevers, chills, fatigue, lassitude. HEENT:   No-  headaches, difficulty swallowing, tooth/dental problems, sore throat,       No-routine  sneezing, itching, ear ache, nasal congestion, post nasal drip,  CV:  No-   chest pain, orthopnea, PND, swelling in lower extremities, anasarca, dizziness, palpitations Resp: No-   shortness of breath with exertion or at rest.              No-   productive cough,  No non-productive cough,  No- coughing up of blood.              No-   change in color of mucus.  No- wheezing.   Skin: No-   rash or lesions. GI:  No-   heartburn, indigestion, abdominal pain, nausea, vomiting, diarrhea,                 change in bowel habits, loss of appetite GU: No-   dysuria, change in color of urine, no urgency or frequency.  No- flank pain. MS:  No-   joint pain or swelling.  No- decreased range of motion.  No- back pain. Neuro-      nothing unusual Psych:  No- change in mood or affect. No depression or anxiety.  No memory loss.      Objective:   Physical Exam General- Alert, Oriented, Affect-appropriate, Distress- none acute Skin- rash-none, lesions- none, excoriation- none Lymphadenopathy- none Head- atraumatic            Eyes- Gross vision intact, PERRLA, conjunctivae clear secretions            Ears- Hearing, canals-normal            Nose- thick white mucus bridging, no-Septal dev, polyps, erosion, perforation             Throat- Mallampati II , mucosa clear , drainage- none, tonsils- atrophic Neck- flexible , trachea midline, no stridor , thyroid nl, carotid no bruit Chest - symmetrical excursion , unlabored           Heart/CV- RRR , no murmur , no gallop  , no rub, nl s1 s2                           - JVD- none , edema- none, stasis changes-  none, varices- none           Lung- clear to P&A, wheeze- none, cough- none , dullness-none, rub- none           Chest wall-  Abd- tender-no, distended-no, bowel sounds-present, HSM- no Br/ Gen/ Rectal- Not done, not indicated Extrem- cyanosis- none, clubbing, none, atrophy- none, strength- nl Neuro- grossly intact to observation

## 2011-09-06 NOTE — Assessment & Plan Note (Signed)
Recent upper respiratory infection self-limited. She has rhinitis on an ongoing basis but I don't think she has a sinus infection. We discussed saline lavage and supportive measures.

## 2011-09-06 NOTE — Assessment & Plan Note (Signed)
She is quite satisfied to continue allergy vaccine and credits forward to seeing the number of acute respiratory illnesses.

## 2011-09-06 NOTE — Patient Instructions (Signed)
Flu vax  Refill scripts sent  Continue present treatment- please call as needed

## 2011-11-10 ENCOUNTER — Telehealth: Payer: Self-pay | Admitting: Internal Medicine

## 2011-11-10 MED ORDER — OSELTAMIVIR PHOSPHATE 75 MG PO CAPS: 75.0000 mg | ORAL_CAPSULE | Freq: Two times a day (BID) | ORAL | Status: AC

## 2011-11-10 NOTE — Telephone Encounter (Signed)
Per [facility]-okay to give Tamiflu 75 mg 310 take 1 po bid no refills.

## 2011-11-10 NOTE — Telephone Encounter (Signed)
Last ov with CDY 11.5.12, upcoming 5.9.13.  LMOM TCB x1.

## 2011-11-10 NOTE — Telephone Encounter (Signed)
I spoke with pt and is aware of cdy recs. rx has been called [facility] [address] since e prescribe is down.

## 2011-11-10 NOTE — Telephone Encounter (Signed)
Spoke with pt. She c/o fever (up to 101 last night), body aches runny nose x 2 days. She states that she had some cough this am that was prod with very minimal yellow sputum. She is taking nyquil and this has only helped some. No openings with CDY today. Please advise, thanks! Allergies  Allergen Reactions  . Azithromycin     REACTION: Stomach cramps

## 2011-11-24 ENCOUNTER — Telehealth: Payer: Self-pay | Admitting: Internal Medicine

## 2011-11-24 MED ORDER — DOXYCYCLINE HYCLATE 100 MG PO TABS: ORAL_TABLET | ORAL | Status: AC

## 2011-11-24 NOTE — Telephone Encounter (Signed)
I spoke with the the pt and she states x 2 weeks ago she called in and was given tamiflu and this helped her symptoms at that time, but x 4 days she has began to have chest congestion, productive cough with yellow phlegm, sore throat, ear pain. She states she does not feel that same as she did 2 weeks ago.  She denies any fever at this time or head congestion.  She has not tried anything OTC. No appts available today. Please advise. Carron Curie, CMA Allergies  Allergen Reactions  . Azithromycin     REACTION: Stomach cramps

## 2011-11-24 NOTE — Telephone Encounter (Signed)
Per CDY- call in doxycycline 100 mg #8  Spoke with pt and notified of this and she verbalized understanding.  Rx was sent to pharm.

## 2011-12-02 ENCOUNTER — Ambulatory Visit (INDEPENDENT_AMBULATORY_CARE_PROVIDER_SITE_OTHER): Payer: BC Managed Care – PPO

## 2011-12-02 DIAGNOSIS — J309 Allergic rhinitis, unspecified: Secondary | ICD-10-CM

## 2012-03-09 ENCOUNTER — Ambulatory Visit: Payer: BC Managed Care – PPO | Admitting: Internal Medicine

## 2012-03-13 ENCOUNTER — Encounter: Payer: Self-pay | Admitting: Internal Medicine

## 2012-03-13 ENCOUNTER — Ambulatory Visit (INDEPENDENT_AMBULATORY_CARE_PROVIDER_SITE_OTHER): Payer: BC Managed Care – PPO | Admitting: Internal Medicine

## 2012-03-13 VITALS — BP 118/78 | HR 70 | Ht 63.0 in | Wt 117.4 lb

## 2012-03-13 DIAGNOSIS — J301 Allergic rhinitis due to pollen: Secondary | ICD-10-CM

## 2012-03-13 DIAGNOSIS — J209 Acute bronchitis, unspecified: Secondary | ICD-10-CM

## 2012-03-13 MED ORDER — EPINEPHRINE 0.3 MG/0.3ML IJ DEVI: 0.3000 mg | Freq: Once | INTRAMUSCULAR | Status: DC

## 2012-03-13 NOTE — Patient Instructions (Signed)
Refill script for Epipen  Sample Tudorza inhaler       1 puff, twice daily

## 2012-03-13 NOTE — Progress Notes (Signed)
Patient ID: Morgan Wood, female    DOB: [birth year], 36 y.o.   MRN: [id number]  HPI 03/08/11- 30 yoF  Never smoker, followed for rhinosinusitis and asthmatic bronchitis.  Last here Sep 07, 2010. She got through the winter well. She credits her allergy shots for an unusually good spring, with little stuffiness. Taking only an occasional Z pak  if she plans to be outside.  She uses Qvar in stretches, but complains of cost. Asks about using her rescue inhaler instead.  Recognizes some of her chest tightness is due to anxiety.   09/06/11- 35 yoF  Never smoker, followed for rhinosinusitis and asthmatic bronchitis.  She should cough one head cold earlier in the summer but since then has felt well. 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 She is doing well now with allergy vaccine at 1:10 and she is not interested in making any changes. She does use Neti pot when needed.   03/13/12- 35 yoF  Never smoker, followed for rhinosinusitis and asthmatic bronchitis. Wheezing is worse at night when laying down. Incidentally noted increased wheeze over the last 2 months. Chronic pressure sensation under her lower left anterior costal margin. We discussed anatomy in that area. Her primary physician gave gabapentin "to calm my nerves", and I think she considers herself an anxious person.  Review of Systems-See HPI Constitutional:   No-   weight loss, night sweats, fevers, chills, fatigue, lassitude. HEENT:   No-  headaches, difficulty swallowing, tooth/dental problems, sore throat,       No-routine  sneezing, itching, ear ache, nasal congestion, post nasal drip,  CV:  No-   chest pain, orthopnea, PND, swelling in lower extremities, anasarca, dizziness, palpitations Resp: No-   shortness of breath with exertion or at rest.              No-   productive cough,  No non-productive cough,  No- coughing up of blood.        No-   change in color of mucus.  + wheezing.   Skin: No-   rash or lesions. GI:  No-   heartburn, indigestion, abdominal pain, nausea, vomiting,  GU:  MS:  No-   joint pain or swelling.   Neuro-     nothing unusual Psych:  No- change in mood or affect. No depression or anxiety.  No memory loss.      Objective:   Physical Exam General- Alert, Oriented, Affect-appropriate, Distress- none acute Skin- rash-none, lesions- none, excoriation- none Lymphadenopathy- none Head- atraumatic            Eyes- Gross vision intact, PERRLA, conjunctivae clear secretions            Ears- Hearing, canals-normal            Nose- thick white mucus bridging, no-Septal dev, polyps, erosion, perforation             Throat- Mallampati II , mucosa clear , drainage- none, tonsils- atrophic Neck- flexible , trachea midline, no stridor , thyroid nl, carotid no bruit Chest - symmetrical excursion , unlabored           Heart/CV- RRR , no murmur , no gallop  , no rub, nl s1 s2  JVD- trace/ fills from above , edema- none, stasis changes- none, varices- none           Lung- clear to P&A, wheeze- none, cough- none , dullness-none, rub- none           Chest wall-  Abd- tender-no, distended-no, bowel sounds-present, HSM- no Br/ Gen/ Rectal- Not done, not indicated Extrem- cyanosis- none, clubbing, none, atrophy- none, strength- nl Neuro- grossly intact to observation

## 2012-03-17 NOTE — Assessment & Plan Note (Signed)
She continues allergy vaccine successfully.

## 2012-03-17 NOTE — Assessment & Plan Note (Signed)
She may be experiencing a very mild increase in asthma, most evident while supine. Plan-try Tudorza inhaler- not stimulating

## 2012-05-23 LAB — HM PAP SMEAR: HM Pap smear: NEGATIVE

## 2012-07-14 ENCOUNTER — Ambulatory Visit (INDEPENDENT_AMBULATORY_CARE_PROVIDER_SITE_OTHER): Payer: BC Managed Care – PPO | Admitting: Family Medicine

## 2012-07-14 VITALS — BP 124/78 | HR 78 | Temp 98.1°F | Resp 14 | Ht 63.5 in | Wt 114.8 lb

## 2012-07-14 DIAGNOSIS — M25879 Other specified joint disorders, unspecified ankle and foot: Secondary | ICD-10-CM

## 2012-07-14 DIAGNOSIS — M25579 Pain in unspecified ankle and joints of unspecified foot: Secondary | ICD-10-CM

## 2012-07-14 NOTE — Patient Instructions (Signed)
Consider putting a little ice to it tonight and tomorrow return if worse.

## 2012-07-14 NOTE — Progress Notes (Signed)
Subjective Ganglion cyst grew up on her right foot over the last week. It's been tender. 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  Objective A small less than 1 CM ganglion cyst on dorsum of right foot  This was prepped in a sterile fashion. Aspiration was attempted a little bit of pain in the bevel of the needle. Then I injected a little of a mixture of 50% 2% lidocaine infection at Depo-Medrol, a total of about 0.1- 0.2 cc. Patient tolerated this well. He had been prepped with iodine and anesthetized with ethyl chloride.  Assessment: Diagnosis right foot  Plan: Let me know if he keeps coming back.

## 2012-07-19 ENCOUNTER — Ambulatory Visit (INDEPENDENT_AMBULATORY_CARE_PROVIDER_SITE_OTHER): Payer: BC Managed Care – PPO

## 2012-07-19 DIAGNOSIS — J309 Allergic rhinitis, unspecified: Secondary | ICD-10-CM

## 2012-09-14 ENCOUNTER — Ambulatory Visit (INDEPENDENT_AMBULATORY_CARE_PROVIDER_SITE_OTHER): Payer: BC Managed Care – PPO | Admitting: Internal Medicine

## 2012-09-14 ENCOUNTER — Encounter: Payer: Self-pay | Admitting: Internal Medicine

## 2012-09-14 VITALS — BP 114/58 | HR 75 | Ht 63.0 in | Wt 116.8 lb

## 2012-09-14 DIAGNOSIS — J301 Allergic rhinitis due to pollen: Secondary | ICD-10-CM

## 2012-09-14 MED ORDER — EPINEPHRINE 0.3 MG/0.3ML IJ DEVI: 0.3000 mg | Freq: Once | INTRAMUSCULAR | Status: AC

## 2012-09-14 MED ORDER — VALACYCLOVIR HCL 1 G PO TABS: ORAL_TABLET | ORAL | Status: DC

## 2012-09-14 NOTE — Patient Instructions (Addendum)
Refill script for epipen in case of severe allergic reaction to allergy vaccine  Sample Dymista nasal spray     1-2 puffs each nostril once daily at bedtime  Flu vax

## 2012-09-14 NOTE — Progress Notes (Signed)
Patient ID: Morgan Wood, female    DOB: 15-Apr-1976, 36 y.o.   MRN: [id number]  HPI 03/08/11- 36 yoF  Never smoker, followed for rhinosinusitis and asthmatic bronchitis.  Last here Sep 07, 2010. She got through the winter well. She credits her allergy shots for an unusually good spring, with little stuffiness. Taking only an occasional Z pak  if she plans to be outside.  She uses Qvar in stretches, but complains of cost. Asks about using her rescue inhaler instead.  Recognizes some of her chest tightness is due to anxiety.   09/06/11- 36 yoF  Never smoker, followed for rhinosinusitis and asthmatic bronchitis.  She should cough one head cold earlier in the summer but since then has felt well. 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 She is doing well now with allergy vaccine at 1:10 and she is not interested in making any changes. She does use Neti pot when needed.   03/13/12- 36 yoF  Never smoker, followed for rhinosinusitis and asthmatic bronchitis. Wheezing is worse at night when laying down. Incidentally noted increased wheeze over the last 2 months. Chronic pressure sensation under her lower left anterior costal margin. We discussed anatomy in that area. Her primary physician gave gabapentin "to calm my nerves", and I think she considers herself an anxious person.  09/14/12- 36 yoF  Never smoker, followed for rhinosinusitis and asthmatic bronchitis. Continues allergy vaccine 1:10 GO Recent prednisone for sinusitis. Frequent colds. 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  Review of Systems-See HPI Constitutional:   No-   weight loss, night sweats, fevers, chills, fatigue, lassitude. HEENT:   No-  headaches, difficulty swallowing, tooth/dental problems, sore throat,       No-routine  sneezing, itching, ear ache, +nasal congestion, +post nasal drip,  CV:  No-    chest pain, orthopnea, PND, swelling in lower extremities, anasarca, dizziness, palpitations Resp: No-   shortness of breath with exertion or at rest.              No-   productive cough,  No non-productive cough,  No- coughing up of blood.              No-   change in color of mucus.  Little wheezing.   Skin: No-   rash or lesions. GI:  No-   heartburn, indigestion, abdominal pain, nausea, vomiting,  GU:  MS:  No-   joint pain or swelling.   Neuro-     nothing unusual Psych:  No- change in mood or affect. No depression or anxiety.  No memory loss.   Objective:   Physical Exam General- Alert, Oriented, Affect-appropriate, Distress- none acute Skin- rash-none, lesions- none, excoriation- none Lymphadenopathy- none Head- atraumatic            Eyes- Gross vision intact, PERRLA, conjunctivae clear secretions            Ears- Hearing, canals-normal            Nose- + mucus bridging, no-Septal dev, polyps, erosion, perforation             Throat- Mallampati II , mucosa clear , drainage- none, tonsils- atrophic Neck- flexible , trachea midline, no stridor , thyroid nl, carotid no bruit Chest - symmetrical excursion , unlabored  Heart/CV- RRR , no murmur , no gallop  , no rub, nl s1 s2                            JVD- trace/ fills from above , edema- none, stasis changes- none, varices- none           Lung- clear to P&A, wheeze- none, cough- none , dullness-none, rub- none           Chest wall-  Abd-  Br/ Gen/ Rectal- Not done, not indicated Extrem- cyanosis- none, clubbing, none, atrophy- none, strength- nl Neuro- grossly intact to observation

## 2012-09-24 NOTE — Assessment & Plan Note (Signed)
We discussed risk, safety , goals and realistic expectations of allergy vaccine. Distinction between virus and allergy.  Plan-flu vaccine. Refill EpiPen. Sample trial Dymista nasal spray

## 2012-10-10 ENCOUNTER — Other Ambulatory Visit: Payer: Self-pay | Admitting: Gastroenterology

## 2012-10-11 ENCOUNTER — Encounter (HOSPITAL_COMMUNITY): Payer: Self-pay | Admitting: Pharmacy Technician

## 2012-10-13 ENCOUNTER — Ambulatory Visit (HOSPITAL_COMMUNITY)
Admission: RE | Admit: 2012-10-13 | Discharge: 2012-10-13 | Disposition: A | Discharge status: Home / Self Care | Payer: BC Managed Care – PPO | Source: Ambulatory Visit | Attending: Gastroenterology | Admitting: Gastroenterology

## 2012-10-13 ENCOUNTER — Encounter (HOSPITAL_COMMUNITY): Payer: Self-pay | Admitting: Gastroenterology

## 2012-10-13 ENCOUNTER — Encounter (HOSPITAL_COMMUNITY)
Admission: RE | Disposition: A | Discharge status: Home / Self Care | Payer: Self-pay | Source: Ambulatory Visit | Attending: Gastroenterology

## 2012-10-13 DIAGNOSIS — R933 Abnormal findings on diagnostic imaging of other parts of digestive tract: Secondary | ICD-10-CM | POA: Insufficient documentation

## 2012-10-13 HISTORY — DX: Acute laryngitis: J04.0

## 2012-10-13 HISTORY — PX: EUS: SHX5427

## 2012-10-13 HISTORY — DX: Bronchitis, not specified as acute or chronic: J40

## 2012-10-13 HISTORY — DX: Other allergy status, other than to drugs and biological substances: Z91.09

## 2012-10-13 SURGERY — UPPER ENDOSCOPIC ULTRASOUND (EUS) LINEAR
Anesthesia: Moderate Sedation

## 2012-10-13 MED ORDER — SODIUM CHLORIDE 0.9 % IV SOLN: INTRAVENOUS | Status: DC

## 2012-10-13 MED ORDER — DIPHENHYDRAMINE HCL 50 MG/ML IJ SOLN
INTRAMUSCULAR | Status: AC
  Filled 2012-10-13: qty 1

## 2012-10-13 MED ORDER — MIDAZOLAM HCL 10 MG/2ML IJ SOLN
INTRAMUSCULAR | Status: DC | PRN
  Administered 2012-10-13 (×4): 2 mg via INTRAVENOUS

## 2012-10-13 MED ORDER — FENTANYL CITRATE 0.05 MG/ML IJ SOLN
INTRAMUSCULAR | Status: AC
  Filled 2012-10-13: qty 4

## 2012-10-13 MED ORDER — BUTAMBEN-TETRACAINE-BENZOCAINE 2-2-14 % EX AERO
INHALATION_SPRAY | CUTANEOUS | Status: DC | PRN
  Administered 2012-10-13: 2 via TOPICAL

## 2012-10-13 MED ORDER — MIDAZOLAM HCL 10 MG/2ML IJ SOLN
INTRAMUSCULAR | Status: AC
  Filled 2012-10-13: qty 6

## 2012-10-13 MED ORDER — FENTANYL CITRATE 0.05 MG/ML IJ SOLN
INTRAMUSCULAR | Status: DC | PRN
  Administered 2012-10-13 (×4): 25 ug via INTRAVENOUS

## 2012-10-13 NOTE — H&P (Signed)
  Morgan Wood HPI: The patient was recently evaluated by Dr. Loreta Ave for epigastric pain/discomfort.  During the EGD she was noted to have a small submucosal lesion in the distal esophagus.  As a result of the finding the patient is referred for further evaluation.  Past Medical History  Diagnosis Date  . Allergic rhinitis   . Asthma   . Headache     Past Surgical History  Procedure Date  . Nasal septum surgery     Family History  Problem Relation Age of Onset  . Diabetes Other   . Heart disease Other     Social History:  reports that she has never smoked. She has never used smokeless tobacco. She reports that she does not drink alcohol or use illicit drugs.  Allergies:  Allergies  Allergen Reactions  . Azithromycin Other (See Comments)    Stomach cramps    Medications:  Scheduled:  Continuous:   . sodium chloride      No results found for this or any previous visit (from the past 24 hour(s)).   No results found.  ROS:  As stated above in the HPI otherwise negative.  Last menstrual period 10/12/2012.    PE: Gen: NAD, Alert and Oriented HEENT:  [place]/AT, EOMI Neck: Supple, no LAD Lungs: CTA Bilaterally CV: RRR without M/G/R ABM: Soft, NTND, +BS Ext: No C/C/E  Assessment/Plan: 1) Distal esophageal submucosal lesion.   I reviewed the endoscopic images.  The lesion is small.  I will perform an EUS for further evaluation.  Plan: 1) EUS.  Marquelle Musgrave D 10/13/2012, 1:03 PM

## 2012-10-13 NOTE — Op Note (Signed)
[facility] [address] [place], [address]   ENDOSCOPIC ULTRASOUND PROCEDURE REPORT  PATIENT: Morgan Wood, Morgan Wood  MR#: [id number] BIRTHDATE: [birth year]  GENDER: Female ENDOSCOPIST: Jeani Hawking, MD REFERRED BY: PROCEDURE DATE:  10/13/2012 PROCEDURE:   Upper EUS ASA CLASS:      Class I INDICATIONS:   1.  Distal esophageal submucosal lesion. MEDICATIONS: Versed 8 mg IV and Fentanyl 100 mcg IV  DESCRIPTION OF PROCEDURE:   After the risks benefits and alternatives of the procedure were  explained, informed consent was obtained. The patient was then placed in the left, lateral, decubitus postion and IV sedation was administered. Throughout the procedure, the patients blood pressure, pulse and oxygen saturations were monitored continuously.  Under direct visualization, the EUS scope [id number]  endoscope was introduced through the mouth  and advanced to the second portion of the duodenum .  Water was used as necessary to provide an acoustic interface.  Upon completion of the imaging, water was removed and the patient was sent to the recovery room in satisfactory condition.   FINDINGS:  Gross visualization of the distal esophagus and the proximal gastric region was negative for any submucosal mass, however, a couple of folds mimicked a submucosal lesion.  These folds flattened out with insufflation. The ultrasound examination of the lower esophagus was negative for any abnormalities.  The upper abdominal structures were evaluated and there was no evidence of any abnormaities.   The scope was then withdrawn from the patient and the procedure completed.  COMPLICATIONS: There were no complications. ENDOSCOPIC VISUALIZATION:  ULTRASONIC VISUALIZATION:  STAGING:  ENDOSCOPIC IMPRESSION: 1) Normal examination.  Proximal gastric folds mimicing a submucosal lesion.  RECOMMENDATIONS: 1) Continue with current management and follow up with Dr.  Loreta Ave as previously  recommended. _______________________________ eSigned:  Jeani Hawking, MD 10/13/2012 2:36 PM   CC:

## 2012-10-13 NOTE — Discharge Instructions (Signed)
Gastrointestinal Endoscopy °Care After °Refer to this sheet in the next few weeks. These instructions provide you with information on caring for yourself after your procedure. Your caregiver may also give you more specific instructions. Your treatment has been planned according to current medical practices, but problems sometimes occur. Call your caregiver if you have any problems or questions after your procedure. °HOME CARE INSTRUCTIONS °· If you were given medicine to help you relax (sedative), do not drive, operate machinery, or sign important documents for 24 hours. °· Avoid alcohol and hot or warm beverages for the first 24 hours after the procedure. °· Only take over-the-counter or prescription medicines for pain, discomfort, or fever as directed by your caregiver. You may resume taking your normal medicines unless your caregiver tells you otherwise. Ask your caregiver when you may resume taking medicines that may cause bleeding, such as aspirin, clopidogrel, or warfarin. °· You may return to your normal diet and activities on the day after your procedure, or as directed by your caregiver. Walking may help to reduce any bloated feeling in your abdomen. °· Drink enough fluids to keep your urine clear or pale yellow. °· You may gargle with salt water if you have a sore throat. °SEEK IMMEDIATE MEDICAL CARE IF: °· You have severe nausea or vomiting. °· You have severe abdominal pain, abdominal cramps that last longer than 6 hours, or abdominal swelling (distention). °· You have severe shoulder or back pain. °· You have trouble swallowing. °· You have shortness of breath, your breathing is shallow, or you are breathing faster than normal. °· You have a fever or a rapid heartbeat. °· You vomit blood or material that looks like coffee grounds. °· You have bloody, black, or tarry stools. °MAKE SURE YOU: °· Understand these instructions. °· Will watch your condition. °· Will get help right away if you are not doing  well or get worse. °Document Released: 06/01/2004 Document Revised: 04/18/2012 Document Reviewed: 01/18/2012 °ExitCare® Patient Information ©2013 [employer]. ° °

## 2012-10-16 ENCOUNTER — Encounter (HOSPITAL_COMMUNITY): Payer: Self-pay | Admitting: Gastroenterology

## 2012-11-20 ENCOUNTER — Ambulatory Visit (INDEPENDENT_AMBULATORY_CARE_PROVIDER_SITE_OTHER): Payer: BC Managed Care – PPO

## 2012-11-20 DIAGNOSIS — J309 Allergic rhinitis, unspecified: Secondary | ICD-10-CM

## 2012-12-04 ENCOUNTER — Telehealth: Payer: Self-pay | Admitting: Internal Medicine

## 2012-12-04 NOTE — Telephone Encounter (Signed)
lmomtcb x1 for pt 

## 2012-12-04 NOTE — Telephone Encounter (Signed)
Returning call can be reached at [phone].Morgan Wood

## 2012-12-04 NOTE — Telephone Encounter (Signed)
I don't think the pain and asthma are related. For pain, offer trial of librax   # 25, 1 twice daily if needed (can be sedating)  For asthma, does she have a rescue inhaler to use every 4 hours prn?

## 2012-12-04 NOTE — Telephone Encounter (Signed)
I spoke with the pt and she states that she has a pain under her left breast that has been present for several years on and off. She states she has discussed this with CY in the past. She states the pain has been present constantly now for several weeks. She describes that pain as sometimes being a sharp stabbing pain, but that there is always a dull ache present. Pt states she has also noticed that she has been having some increase in her wheezing especially at night and first thing in the morning. Pt denies any SOB or cough at this time. Please advise. Carron Curie, CMA Allergies  Allergen Reactions  . Azithromycin Other (See Comments)    Stomach cramps

## 2012-12-04 NOTE — Telephone Encounter (Signed)
Pt is aware of CY recs. But at this time she doesn't want to take anymore medication. I advised that if she changes her mind to call.  She does have a rescue inhaler if she needs to use it, she states that she hasn't felt like she needed it yet.

## 2013-02-09 ENCOUNTER — Telehealth: Payer: Self-pay | Admitting: Internal Medicine

## 2013-02-09 NOTE — Telephone Encounter (Signed)
Per CY-OTC Allegra 180 mg take 1 po QD.

## 2013-02-09 NOTE — Telephone Encounter (Signed)
I spoke with Morgan Wood. She c/o nasal congestion, HA, scratchy throat, PND, wheezing, chest tx, facial pressure x Monday. No cough, SOB, no f/c/s/n/v. She has been taking dayquil and tylenol. Morgan Wood requesting further recs. Please advise Dr. Maple Hudson thanks Last OV 09/14/12 Pending 03/15/13 Allergies  Allergen Reactions  . Azithromycin Other (See Comments)    Stomach cramps

## 2013-02-09 NOTE — Telephone Encounter (Signed)
i spoke with pt and is aware of CDY recs. Nothing further was needed

## 2013-02-14 ENCOUNTER — Other Ambulatory Visit (INDEPENDENT_AMBULATORY_CARE_PROVIDER_SITE_OTHER): Payer: BC Managed Care – PPO

## 2013-02-14 ENCOUNTER — Encounter: Payer: Self-pay | Admitting: Internal Medicine

## 2013-02-14 ENCOUNTER — Ambulatory Visit (INDEPENDENT_AMBULATORY_CARE_PROVIDER_SITE_OTHER): Payer: BC Managed Care – PPO | Admitting: Internal Medicine

## 2013-02-14 ENCOUNTER — Telehealth: Payer: Self-pay | Admitting: Internal Medicine

## 2013-02-14 ENCOUNTER — Ambulatory Visit (INDEPENDENT_AMBULATORY_CARE_PROVIDER_SITE_OTHER)
Admission: RE | Admit: 2013-02-14 | Discharge: 2013-02-14 | Disposition: A | Discharge status: Home / Self Care | Payer: BC Managed Care – PPO | Source: Ambulatory Visit | Attending: Internal Medicine | Admitting: Internal Medicine

## 2013-02-14 VITALS — BP 90/62 | HR 84 | Temp 96.7°F | Ht 63.0 in | Wt 113.0 lb

## 2013-02-14 DIAGNOSIS — J018 Other acute sinusitis: Secondary | ICD-10-CM

## 2013-02-14 DIAGNOSIS — J301 Allergic rhinitis due to pollen: Secondary | ICD-10-CM

## 2013-02-14 DIAGNOSIS — R079 Chest pain, unspecified: Secondary | ICD-10-CM

## 2013-02-14 DIAGNOSIS — J209 Acute bronchitis, unspecified: Secondary | ICD-10-CM

## 2013-02-14 LAB — CBC WITH DIFFERENTIAL/PLATELET
Basophils Relative: 0.5 % (ref 0.0–3.0)
Eosinophils Absolute: 0.1 10*3/uL (ref 0.0–0.7)
Eosinophils Relative: 1 % (ref 0.0–5.0)
Hemoglobin: 14.1 g/dL (ref 12.0–15.0)
MCHC: 32.8 g/dL (ref 30.0–36.0)
MCV: 88.8 fl (ref 78.0–100.0)
Monocytes Absolute: 1.1 10*3/uL — ABNORMAL HIGH (ref 0.1–1.0)
Neutro Abs: 9.8 10*3/uL — ABNORMAL HIGH (ref 1.4–7.7)
RBC: 4.83 Mil/uL (ref 3.87–5.11)
WBC: 13.3 10*3/uL — ABNORMAL HIGH (ref 4.5–10.5)

## 2013-02-14 MED ORDER — AMOXICILLIN-POT CLAVULANATE 875-125 MG PO TABS: 1.0000 | ORAL_TABLET | Freq: Two times a day (BID) | ORAL | Status: DC

## 2013-02-14 MED ORDER — TRAMADOL HCL 50 MG PO TABS: ORAL_TABLET | ORAL | Status: DC

## 2013-02-14 NOTE — Assessment & Plan Note (Signed)
?   Complicated by sinusitis > rx 10 days augmentin

## 2013-02-14 NOTE — Assessment & Plan Note (Deleted)
?   Complicated by sinusitis > rx 10 days augmentin 

## 2013-02-14 NOTE — Progress Notes (Signed)
Patient ID: Morgan Wood, female    DOB: 03/31/1976, 37 y.o.   MRN: [id number]  HPI 03/08/11- 51 yoF  Never smoker, followed for rhinosinusitis and asthmatic bronchitis.  Last here Sep 07, 2010. She got through the winter well. She credits her allergy shots for an unusually good spring, with little stuffiness. Taking only an occasional Z pak  if she plans to be outside.  She uses Qvar in stretches, but complains of cost. Asks about using her rescue inhaler instead.  Recognizes some of her chest tightness is due to anxiety.   09/06/11- 35 yoF  Never smoker, followed for rhinosinusitis and asthmatic bronchitis.  She should cough one head cold earlier in the summer but since then has felt well. 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 She is doing well now with allergy vaccine at 1:10 and she is not interested in making any changes. She does use Neti pot when needed.   03/13/12- 35 yoF  Never smoker, followed for rhinosinusitis and asthmatic bronchitis. Wheezing is worse at night when laying down. Incidentally noted increased wheeze over the last 2 months. Chronic pressure sensation under her lower left anterior costal margin. We discussed anatomy in that area. Her primary physician gave gabapentin "to calm my nerves", and I think she considers herself an anxious person.  09/14/12- 36 yoF  Never smoker, followed for rhinosinusitis and asthmatic bronchitis. Continues allergy vaccine 1:10 GO Recent prednisone for sinusitis. Frequent colds. 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   02/14/2013 acute ov/Morgan Wood  Chief Complaint  Patient presents with  . Acute Visit    Pt c/o SOB with or without exertion x 3 days. She is also having dull, achy pain under left breast that radiates to her back. Pain worse with deep inspiration and after eating.    breathing worse x one week  with nasal congestion/ green mucus then acute L pleuritic cp x 3 days "like a dull ache" made worse p eating or with deep breath, no better when lie down. No gagging or vomiting or fever. Not on bcps, no h/o travel, leg symptoms  Sleeping ok without nocturnal  or early am exacerbation  of respiratory  c/o's or need for noct saba. Also denies any obvious fluctuation of symptoms with weather or environmental changes or other aggravating or alleviating factors except as outlined above  ROS  The following are not active complaints unless bolded sore throat, dysphagia, dental problems, itching, sneezing,  nasal congestion or excess/ purulent secretions, ear ache,   fever, chills, sweats, unintended wt loss, pleuritic cp or exertional cp, hemoptysis,  orthopnea pnd or leg swelling, presyncope, palpitations, heartburn, abdominal pain, anorexia, nausea, vomiting, diarrhea  or change in bowel or urinary habits, change in stools or urine, dysuria,hematuria,  rash, arthralgias, visual complaints, headache, numbness weakness or ataxia or problems with walking or coordination,  change in mood/affect or memory.         Objective:   Physical Exam amb wf nad General- Alert, Oriented, Affect-appropriate, Distress- none acute Skin- rash-none, lesions- none, excoriation- none Lymphadenopathy- none Head- atraumatic            Eyes- Gross vision intact, PERRLA, conjunctivae clear secretions            Ears- Hearing, canals-normal  Nose- + mucus bridging, no-Septal dev, polyps, erosion, perforation             Throat- Mallampati II , mucosa clear , drainage- none, tonsils- atrophic Neck- flexible , trachea midline, no stridor , thyroid nl, carotid no bruit Chest - symmetrical excursion , unlabored           Heart/CV- RRR , no murmur , no gallop  , no rub, nl s1 s2                            JVD- trace/ fills from above , edema- none, stasis changes- none, varices- none           Lung- clear to P&A,  wheeze- none, cough- none , dullness-none, rub- none           Chest wall-  Abd-  Soft, no tenderness   Extrem- cyanosis- none, clubbing, none, atrophy- none, strength- nl    CXR  02/14/2013 :  Hyperinflated lungs. No acute abnormalities. No left effusion or infiltrates

## 2013-02-14 NOTE — Assessment & Plan Note (Addendum)
Most likely mscp from coughing though could have an early pna related to purulent rhinitis/ sinusitis assoc with mild leukocytosis  rec rx rhinitis, rx tramadol for cp and augmentin for ? Sinusitis.  See instructions for specific recommendations which were reviewed directly with the patient who was given a copy with highlighter outlining the key components.

## 2013-02-14 NOTE — Telephone Encounter (Signed)
Called spoke with patient who stated that her head congestion, HA, PND, sore throat and facial pressure have improved w/ the allegra 180mg  However, she has developed worsening SOB/tightness in chest/wheezing, pain in the left lung area and back, dry cough and body aches x2days; still producing green/yellow/clear nasal discharge Neither CY nor TP in office this afternoon Okay per CY for pt to see MW Okay per Verlon Au to double-book at 1:30pm Pt okay with this time and is aware to seek emergency assistance if her breathing worsens prior to appt Nothing further needed at this time

## 2013-02-14 NOTE — Patient Instructions (Addendum)
Please remember to go to the lab and x-ray department downstairs for your tests - we will call you with the results when they are available.  Augmentin 875 mg twice daily x 10 days take a meals and lots of water and yogurt for lunch  Take delsym two tsp every 12 hours and supplement if needed with  tramadol 50 mg up to 1 every 4 hours to suppress the urge to cough. Swallowing water or using ice chips/non mint and menthol containing candies (such as lifesavers or sugarless jolly ranchers) are also effective.  You should rest your voice and avoid activities that you know make you cough.  Once you have eliminated the cough for 3 straight days try reducing the tramadol first,  then the delsym as tolerated.    Call if not improving on treatment in next 48 hours

## 2013-02-14 NOTE — Assessment & Plan Note (Signed)
Mild flare but well controlled on present rx    Each maintenance medication was reviewed in detail including most importantly the difference between maintenance and as needed and under what circumstances the prns are to be used.  Please see instructions for details which were reviewed in writing and the patient given a copy.

## 2013-02-15 NOTE — Progress Notes (Signed)
Quick Note:  Spoke with pt and notified of results per Dr. Wert. Pt verbalized understanding and denied any questions.  ______ 

## 2013-02-20 ENCOUNTER — Telehealth: Payer: Self-pay | Admitting: Obstetrics & Gynecology

## 2013-02-20 NOTE — Telephone Encounter (Signed)
Patient states is still with pain under left breast and has not gotten any  Better since last visit. Appt. Made for Dr Hyacinth Meeker Friday @ 11:30am.

## 2013-02-20 NOTE — Telephone Encounter (Signed)
Patient wants to speak with nurse re: breast pain on left breast radiating down left side/states pain has been ongoing for several years but has gotten worse/[place]

## 2013-02-23 ENCOUNTER — Telehealth: Payer: Self-pay | Admitting: *Deleted

## 2013-02-23 ENCOUNTER — Encounter: Payer: Self-pay | Admitting: Obstetrics & Gynecology

## 2013-02-23 ENCOUNTER — Encounter: Payer: Self-pay | Admitting: *Deleted

## 2013-02-23 ENCOUNTER — Ambulatory Visit (INDEPENDENT_AMBULATORY_CARE_PROVIDER_SITE_OTHER): Payer: BC Managed Care – PPO | Admitting: Obstetrics & Gynecology

## 2013-02-23 VITALS — BP 100/60 | Wt 110.0 lb

## 2013-02-23 DIAGNOSIS — N644 Mastodynia: Secondary | ICD-10-CM

## 2013-02-23 NOTE — Progress Notes (Signed)
   Subjective:   37 y.o. SingleCaucasian female presents for evaluation of left breast pain that has been present for two to three weeks.  This has been and on and off issue for the patient but worse for these last few weeks.  She went to Dr.Wert on 4/16 and had CXR and CBC/Ddimer done.  Was having a lots of sinus issues and shortness of breath.  Was treated for "infection" with amoxicillin mass. She had a mild cough with some wheezing.  Now, cough is gone.  Pain with deep inspiration is gone.  Wheezing is better.  Still, no fever.  No shortness of breath now.  Has played softball this week and can really tell lungs feel better.  Should finish abx tomorrow.  Has tried no OTC products to help.  No nipple d/c.  No recent trauma.  No palpable lumps.  Review of Systems A comprehensive review of systems was negative except for: as per HPI   Objective:   General appearance: alert and cooperative Head: Normocephalic, without obvious abnormality, atraumatic Neck: no adenopathy and thyroid not enlarged, symmetric, no tenderness/mass/nodules Breasts: normal appearance, no masses or tenderness, tenderness to palpation of chest wall just lateral to outer mid portion of left breast   Assessment:   ASSESSMENT: breast/chest wall pain   Plan:   PLAN: Diagnostic L MMG with possible U/S.

## 2013-03-07 ENCOUNTER — Ambulatory Visit
Admission: RE | Admit: 2013-03-07 | Discharge: 2013-03-07 | Disposition: A | Discharge status: Home / Self Care | Payer: BC Managed Care – PPO | Source: Ambulatory Visit | Attending: Obstetrics & Gynecology | Admitting: Obstetrics & Gynecology

## 2013-03-07 DIAGNOSIS — N644 Mastodynia: Secondary | ICD-10-CM

## 2013-03-15 ENCOUNTER — Ambulatory Visit (INDEPENDENT_AMBULATORY_CARE_PROVIDER_SITE_OTHER): Payer: BC Managed Care – PPO | Admitting: Internal Medicine

## 2013-03-15 ENCOUNTER — Encounter: Payer: Self-pay | Admitting: Internal Medicine

## 2013-03-15 VITALS — BP 116/66 | HR 63 | Ht 63.0 in | Wt 111.4 lb

## 2013-03-15 DIAGNOSIS — J301 Allergic rhinitis due to pollen: Secondary | ICD-10-CM

## 2013-03-15 NOTE — Progress Notes (Signed)
Patient ID: Morgan Wood, female    DOB: 05/22/1976, 37 y.o.   MRN: [id number]  HPI 03/08/11- 41 yoF  Never smoker, followed for rhinosinusitis and asthmatic bronchitis.  Last here Sep 07, 2010. She got through the winter well. She credits her allergy shots for an unusually good spring, with little stuffiness. Taking only an occasional Z pak  if she plans to be outside.  She uses Qvar in stretches, but complains of cost. Asks about using her rescue inhaler instead.  Recognizes some of her chest tightness is due to anxiety.   09/06/11- 35 yoF  Never smoker, followed for rhinosinusitis and asthmatic bronchitis.  She should cough one head cold earlier in the summer but since then has felt well. 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 She is doing well now with allergy vaccine at 1:10 and she is not interested in making any changes. She does use Neti pot when needed.   03/13/12- 35 yoF  Never smoker, followed for rhinosinusitis and asthmatic bronchitis. Wheezing is worse at night when laying down. Incidentally noted increased wheeze over the last 2 months. Chronic pressure sensation under her lower left anterior costal margin. We discussed anatomy in that area. Her primary physician gave gabapentin "to calm my nerves", and I think she considers herself an anxious person.  09/14/12- 36 yoF  Never smoker, followed for rhinosinusitis and asthmatic bronchitis. Continues allergy vaccine 1:10 GO Recent prednisone for sinusitis. Frequent colds. 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  02/14/2013 acute ov/Wert  Chief Complaint  Patient presents with  . Acute Visit    Pt c/o SOB with or without exertion x 3 days. She is also having dull, achy pain under left breast that radiates to her back. Pain worse with deep inspiration and after eating.    breathing worse x one week  with nasal congestion/ green mucus then acute L pleuritic cp x 3 days "like a dull ache" made worse p eating or with deep breath, no better when lie down. No gagging or vomiting or fever. Not on bcps, no h/o travel, leg symptoms Sleeping ok without nocturnal  or early am exacerbation  of respiratory  c/o's or need for noct saba. Also denies any obvious fluctuation of symptoms with weather or environmental changes or other aggravating or alleviating factors except as outlined above   03/15/13-   37 yoF  Never smoker, followed for rhinosinusitis and asthmatic bronchitis. FOLLOWS FOR: had flare up allergies mid of April-seen by MW. 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 Continues allergy vaccine 1:10 GO. Has done well since her acute visit in April. We again talked about her chronic pressure sensation in the left upper quadrant/left lower anterior costal margin. There has been no change. It does not seem to be a respiratory pattern. CXR 02/14/13  IMPRESSION:  Hyperinflated lungs.  No acute abnormalities.  Original Report Authenticated By: Ulyses Southward, M.   ROS-see HPI Constitutional:   No-   weight loss, night sweats, fevers, chills, fatigue, lassitude. HEENT:   No-  headaches, difficulty swallowing, tooth/dental problems, sore throat,       No-  sneezing, itching, ear ache, nasal congestion, post nasal drip,  CV:  No-   chest pain, orthopnea, PND, swelling in lower extremities, anasarca,  dizziness, palpitations Resp: No-   shortness of breath with exertion or at rest.              No-   productive cough,  No non-productive cough,  No- coughing up of blood.              No-   change in color of mucus.  No- wheezing.   Skin: No-   rash or lesions. GI:  No-   heartburn, indigestion, abdominal pain, nausea, vomiting,  GU: . MS:  No-   joint pain or swelling.   Neuro-     nothing unusual Psych:  No- change in mood or affect. No depression or  anxiety.  No memory loss.  Objective:   Physical Exam amb wf nad General- Alert, Oriented, Affect-appropriate, Distress- none acute Skin- rash-none, lesions- none, excoriation- none Lymphadenopathy- none Head- atraumatic            Eyes- Gross vision intact, PERRLA, conjunctivae clear secretions            Ears- Hearing, canals-normal            Nose- + mucus bridging, no-Septal dev, polyps, erosion, perforation             Throat- Mallampati II , mucosa clear , drainage- none, tonsils- atrophic Neck- flexible , trachea midline, no stridor , thyroid nl, carotid no bruit Chest - symmetrical excursion , unlabored           Heart/CV- RRR , no murmur , no gallop  , no rub, nl s1 s2                            JVD- trace/ fills from above , edema- none, stasis changes- none, varices- none           Lung- clear to P&A, wheeze- none, cough- none , dullness-none, rub- none           Chest wall-  Abd-  Soft, no tenderness, with particular attention to left upper quadrant  Extrem- cyanosis- none, clubbing, none, atrophy- none, strength- nl

## 2013-03-15 NOTE — Patient Instructions (Addendum)
We can continue allergy vaccine 1:1o  Consider the otc nasal saline gels for dryness or easy nose bleeds  Please call as needed

## 2013-03-24 ENCOUNTER — Encounter: Payer: Self-pay | Admitting: Internal Medicine

## 2013-03-24 NOTE — Assessment & Plan Note (Signed)
She continues allergy vaccine without problems and again expresses believe that it helps her.

## 2013-04-11 ENCOUNTER — Ambulatory Visit (INDEPENDENT_AMBULATORY_CARE_PROVIDER_SITE_OTHER): Payer: BC Managed Care – PPO | Admitting: Family Medicine

## 2013-04-11 VITALS — BP 108/62 | HR 77 | Temp 98.1°F | Resp 18 | Wt 108.0 lb

## 2013-04-11 DIAGNOSIS — IMO0001 Reserved for inherently not codable concepts without codable children: Secondary | ICD-10-CM

## 2013-04-11 DIAGNOSIS — IMO0002 Reserved for concepts with insufficient information to code with codable children: Secondary | ICD-10-CM

## 2013-04-11 DIAGNOSIS — M6740 Ganglion, unspecified site: Secondary | ICD-10-CM

## 2013-04-11 LAB — COMPREHENSIVE METABOLIC PANEL
AST: 16 U/L (ref 0–37)
Albumin: 4.4 g/dL (ref 3.5–5.2)
Alkaline Phosphatase: 40 U/L (ref 39–117)
BUN: 19 mg/dL (ref 6–23)
Calcium: 9.3 mg/dL (ref 8.4–10.5)
Chloride: 105 mEq/L (ref 96–112)
Glucose, Bld: 83 mg/dL (ref 70–99)
Potassium: 4.1 mEq/L (ref 3.5–5.3)
Sodium: 138 mEq/L (ref 135–145)
Total Protein: 7.2 g/dL (ref 6.0–8.3)

## 2013-04-11 LAB — MAGNESIUM: Magnesium: 1.9 mg/dL (ref 1.5–2.5)

## 2013-04-11 MED ORDER — MELOXICAM 15 MG PO TABS: 15.0000 mg | ORAL_TABLET | Freq: Every day | ORAL | Status: DC

## 2013-04-11 NOTE — Patient Instructions (Signed)
Medial Tibial Stress Syndrome (Shin Splints) with Rehab Medial tibial stress syndrome is also called shin splints. Shin splints is a term that is broadly used to describe pain in the lower leg. Shin splints most commonly involve inflammation of the bone lining (periostitis). SYMPTOMS   Pain in the front, or more commonly, the inner part of the lower half of the leg (shin), above the ankle.  Pain that first occurs after exercise, and eventually progresses to pain at the beginning of exercise, that decreases after a short warm up period.  With continued exercise and if left untreated, constant pain that eventually causes the athlete to stop playing sports. CAUSES  Shin splints are an overuse injury, in which the bone lining (periosteum) is broken down at a faster rate than it can be repaired. This leads to inflammation of the periosteum and pain.  RISK INCREASES WITH:  Weakness or imbalance of the muscles of the leg and calf.  Poor strength and flexibility. Failure to warm up properly before activity.  Sports that require repetitive loading or running (marathon running, soccer, walking, jogging), especially on uneven ground or hard surfaces (concrete).  Lack of conditioning, early in the season or practice.  Poor running technique.  Flat feet.  Sudden change in activity intensity, frequency, or duration. PREVENTION  Warm up and stretch properly before activity.  Allow for adequate recovery between workouts.  Maintain physical fitness:  Strength, flexibility, and endurance.  Cardiovascular fitness.  Ensure properly fitted and cushioned shoes.  Wear cushioned arch supports.  Learn and use proper technique and have a coach correct improper technique.  Increase activity gradually.  Run on surfaces that absorb shock, such as grass, composite track, or sand (beach). PROGNOSIS  If treated properly with a slow return to activity, shin splints usually heal within 2 to 8 weeks.    RELATED COMPLICATIONS   Recurring symptoms, that result in a chronic problem.  Longer healing time, if not properly treated or if not given enough time to heal.  Altered level of performance or need to end sports participation, due to pain if activity is continued without treatment. TREATMENT Treatment first involves the use of ice and medicine, to reduce pain and inflammation. The use of strengthening and stretching exercises may help reduce pain with activity. These exercises may be performed at home or with a therapist. For individuals with flat feet, the use of arch supports (orthotics) may be helpful. Sometimes, taping, casting, or bracing the leg may be advised. Slow return to activity is allowed after pain is gone. Rarely, surgery is attempted to remove the chronically inflamed tissue.  MEDICATION  If pain medicine is needed, nonsteroidal anti-inflammatory medicines (aspirin and ibuprofen), or other minor pain relievers (acetaminophen), are often advised.  Do not take pain medicine for 7 days before surgery.  Prescription pain relievers may be given, if your caregiver thinks they are needed. Use only as directed and only as much as you need.  Ointments applied to the skin may be helpful. HEAT AND COLD  Cold treatment (icing) should be applied for 10 to 15 minutes every 2 to 3 hours for inflammation and pain, and immediately after activity that aggravates your symptoms. Use ice packs or an ice massage.  Heat treatment may be used before performing stretching and strengthening activities prescribed by your caregiver, physical therapist, or athletic trainer. Use a heat pack or a warm water soak. SEEK MEDICAL CARE IF:   Symptoms get worse or do not improve in 4   Heat treatment may be used before performing stretching and strengthening activities prescribed by your caregiver, physical therapist, or athletic trainer. Use a heat pack or a warm water soak.  SEEK MEDICAL CARE IF:   · Symptoms get worse or do not improve in 4 to 6 weeks, despite treatment.  · New, unexplained symptoms develop. (Drugs used in treatment may produce side effects.)  EXERCISES  RANGE OF MOTION (ROM) AND STRETCHING EXERCISES - Medial Tibial Stress Syndrome (Shin  Splints)  These exercises may help you when beginning to rehabilitate your injury. Your symptoms may resolve with or without further involvement from your physician, physical therapist or athletic trainer. While completing these exercises, remember:   · Restoring tissue flexibility helps normal motion to return to the joints. This allows healthier, less painful movement and activity.  · An effective stretch should be held for at least 30 seconds.  · A stretch should never be painful. You should only feel a gentle lengthening or release in the stretched tissue.  STRETCH  Gastroc, Standing  · Place your hands on a wall.  · Extend your right / left leg behind you, keeping the front knee somewhat bent.  · Slightly point your toes inward on your back foot.  · Keeping your right / left heel on the floor and your knee straight, shift your weight toward the wall, not allowing your back to arch.  · You should feel a gentle stretch in the right / left calf. Hold this position for __________ seconds.  Repeat __________ times. Complete this stretch __________ times per day.  STRETCH  Soleus, Standing   · Place your hands on a wall.  · Extend your right / left leg behind you, keeping the other knee somewhat bent.  · Slightly point your toes inward on your back foot.  · Keep your right / left heel on the floor, bend your back knee, and slightly shift your weight over the back leg so that you feel a gentle stretch deep in your back calf.  · Hold this position for __________ seconds.  Repeat __________ times. Complete this stretch __________ times per day.  STRETCH  Gastrocsoleus, Standing   Note: This exercise can place a lot of stress on your foot and ankle. Please complete this exercise only if specifically instructed by your caregiver.   · Place the ball of your right / left foot on a step, keeping your other foot firmly on the same step.  · Hold on to the wall or a rail for balance.  · Slowly lift your other foot, allowing  your body weight to press your heel down over the edge of the step.  · You should feel a stretch in your right / left calf.  · Hold this position for __________ seconds.  · Repeat this exercise with a slight bend in your right / left knee.  Repeat __________ times. Complete this stretch __________ times per day.   RANGE OF MOTION - Ankle Eversion   · Sit with your right / left ankle crossed over your opposite knee.  · Grip your foot with your opposite hand, placing your thumb on the top of your foot and your fingers across the bottom of your foot.  · Gently push your foot downward with a slight rotation so your littlest toes rise slightly toward the ceiling.  · You should feel a gentle stretch on the inside of your ankle. Hold the stretch for __________ seconds.  Repeat __________ times. Complete   this exercise __________ times per day.   RANGE OF MOTION - Ankle Inversion  · Sit with your right / left ankle crossed over your opposite knee.  · Grip your foot with your opposite hand, placing your thumb on the bottom of your foot and your fingers across the top of your foot.  · Gently pull your foot so the smallest toe comes toward you and your thumb pushes the inside of the ball of your foot away from you.  · You should feel a gentle stretch on the outside of your ankle. Hold the stretch for __________ seconds.  Repeat __________ times. Complete this exercise __________ times per day.   RANGE OF MOTION- Ankle Plantar Flexion   · Sit with your right / left leg crossed over your opposite knee.  · Use your opposite hand to pull the top of your foot and toes toward you.  · You should feel a gentle stretch on the top of your foot and ankle. Hold this position for __________ seconds.  Repeat __________ times. Complete __________ times per day.   STRENGTHENING EXERCISES - Medial Tibial Stress Syndrome (Shin Splints)  These exercises may help you when beginning to rehabilitate your injury. They may resolve your symptoms with  or without further involvement from your physician, physical therapist or athletic trainer. While completing these exercises, remember:   · Muscles can gain both the endurance and the strength needed for everyday activities through controlled exercises.  · Complete these exercises as instructed by your physician, physical therapist or athletic trainer. Increase the resistance and repetitions only as guided by your caregiver.  STRENGTH - Dorsiflexors  · Secure a rubber exercise band or tubing to a fixed object (table, pole) and loop the other end around your right / left foot.  · Sit on the floor facing the fixed object. The band should be slightly tense when your foot is relaxed.  · Slowly draw your foot back toward you, using your ankle and toes.  · Hold this position for __________ seconds. Slowly release the tension in the band, return your foot to the starting position.  Repeat __________ times. Complete this exercise __________ times per day.   STRENGTH - Towel Curls  · Sit in a chair, on a non-carpeted surface.  · Place your foot on a towel, keeping your heel on the floor.  · Pull the towel toward your heel only by curling your toes. Keep your heel on the floor.  · If instructed by your physician, physical therapist or athletic trainer, you may add weight at the end of the towel.  Repeat __________ times. Complete this exercise __________ times per day.  STRENGTH - Ankle Inversion  · Secure one end of a rubber exercise band or tubing to a fixed object (table, pole). Loop the other end around your foot, just before your toes.  · Place your fists between your knees. This will focus your strengthening at your ankle.  · Slowly, pull your big toe up and in, making sure the band is positioned to resist the entire motion.  · Hold this position for __________ seconds.  · Have your muscles resist the band, as it slowly pulls your foot back to the starting position.  Repeat __________ times. Complete this exercises  __________ times per day.   Document Released: 10/18/2005 Document Revised: 01/10/2012 Document Reviewed: 01/30/2009  [employer]® Patient Information ©2014 [employer].

## 2013-04-11 NOTE — Progress Notes (Signed)
Subjective:    Patient ID: Morgan Wood, female    DOB: Jan 21, 1976, 37 y.o.   MRN: [id number] Chief Complaint  Patient presents with  . Foot Pain    cyst  . Leg Pain    HPI  For the past 3 months, Morgan Wood has been having pain in her lateral right foot and pain radiating up her anterior lower leg and occasionally up to her popliteal fossa - almost feels like a cramp but not really - the best she can describe it.  Has never had shin splints before. Not usually much calf pain but occ rare thigh cramping.  About 9 mos ago, she did have a small ganglion cyst on right lateral foot treated w/ cortisone injection by Dr. Alwyn Ren after which it resolved. However, last week, she noted a new bump popping up more proximal to the prior cyst area that appears when she flexes her foot. Never has any swelling of the foot, ankle, calf, or posterior knee.  No color changes. Does not get worse w/ more activity - often hurts more when she starts activity but will then go away.  She plays softball frequently - about 4d/wk.  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    Only medications are allergy shots and prilosec for about 3 months. No other vitamins or supplements. Takes occ tylenol but stays away from all otc nsaids due to recent endoscopies.  Past Medical History  Diagnosis Date  . Allergic rhinitis   . Asthma   . Headache(784.0)   . Environmental allergies     rash,swelling in throat,itching  . Bronchitis     1-2 episodes yearly  . Laryngitis     2-3episodes yearly   Current Outpatient Prescriptions on File Prior to Visit  Medication Sig Dispense Refill  . albuterol (PROVENTIL HFA) 108 (90 BASE) MCG/ACT inhaler Inhale 2 puffs into the lungs every 4 (four) hours as needed for wheezing or shortness of breath.  1 Inhaler  prn  . Cetirizine HCl (ZYRTEC PO) Take by mouth as needed.       Marland Kitchen EPINEPHrine (EPIPEN 2-PAK) 0.3 mg/0.3 mL DEVI Inject 0.3 mLs (0.3 mg total) into the muscle once. As needed for severe  allergic reaction  1 Device  prn  . valACYclovir (VALTREX) 1000 MG tablet Take 2 tablets as first dose at onset and repeat 12 hours prn outbreak  30 tablet  12   No current facility-administered medications on file prior to visit.   Allergies  Allergen Reactions  . Azithromycin Other (See Comments)    Stomach cramps     Review of Systems  Constitutional: Negative for fever, chills, activity change and unexpected weight change.  Cardiovascular: Negative for leg swelling.  Musculoskeletal: Positive for myalgias, joint swelling and arthralgias. Negative for back pain and gait problem.  Skin: Negative for color change, pallor, rash and wound.  Neurological: Negative for weakness and numbness.  Hematological: Negative for adenopathy. Does not bruise/bleed easily.      BP 108/62  Pulse 77  Temp(Src) 98.1 F (36.7 C) (Oral)  Resp 18  Wt 108 lb (48.988 kg)  BMI 19.14 kg/m2  LMP 04/08/2013 Objective:   Physical Exam  Constitutional: She is oriented to person, place, and time. She appears well-developed and well-nourished. No distress.  HENT:  Head: Normocephalic and atraumatic.  Right Ear: External ear normal.  Eyes: Conjunctivae are normal. No scleral icterus.  Pulmonary/Chest: Effort normal.  Musculoskeletal:       Right knee:  Normal. She exhibits normal range of motion, no swelling, no effusion, no ecchymosis, no erythema, normal alignment and no bony tenderness. No tenderness found.       Right ankle: Normal. She exhibits normal range of motion, no swelling and no deformity. No tenderness. Achilles tendon normal. Achilles tendon exhibits no pain, no defect and normal Thompson's test results.       Left ankle: Normal. She exhibits normal range of motion. Achilles tendon normal.       Right foot: Normal. She exhibits normal range of motion, no tenderness and no bony tenderness.       Left foot: Normal. She exhibits normal range of motion, no tenderness and no bony tenderness.        Feet:  Small ganglion cyst with foot flexion, soft and compressible  Neurological: She is alert and oriented to person, place, and time. She has normal strength. She displays no atrophy. No sensory deficit. She exhibits normal muscle tone. She displays a negative Romberg sign. Coordination and gait normal.  Skin: Skin is warm and dry. She is not diaphoretic. No erythema.  Psychiatric: She has a normal mood and affect. Her behavior is normal.      Assessment & Plan:  Myalgia and myositis - Plan: Magnesium, POCT SEDIMENTATION RATE, Comprehensive metabolic panel, TSH, CK, Ambulatory referral to Sports Medicine  Ganglion cyst - Plan: Ambulatory referral to Sports Medicine  Shin splint, initial encounter - Plan: Ambulatory referral to Sports Medicine  Meds ordered this encounter  Medications  . omeprazole (PRILOSEC) 20 MG capsule    Sig: Take 20 mg by mouth daily.  . meloxicam (MOBIC) 15 MG tablet    Sig: Take 1 tablet (15 mg total) by mouth daily.    Dispense:  30 tablet    Refill:  1

## 2013-05-03 ENCOUNTER — Encounter: Payer: Self-pay | Admitting: Sports Medicine

## 2013-05-03 ENCOUNTER — Ambulatory Visit (INDEPENDENT_AMBULATORY_CARE_PROVIDER_SITE_OTHER): Payer: BC Managed Care – PPO | Admitting: Sports Medicine

## 2013-05-03 VITALS — BP 118/78 | HR 75 | Ht 63.0 in | Wt 110.0 lb

## 2013-05-03 DIAGNOSIS — M6749 Ganglion, multiple sites: Secondary | ICD-10-CM | POA: Insufficient documentation

## 2013-05-03 DIAGNOSIS — M79609 Pain in unspecified limb: Secondary | ICD-10-CM

## 2013-05-03 DIAGNOSIS — M67469 Ganglion, unspecified knee: Secondary | ICD-10-CM

## 2013-05-03 DIAGNOSIS — M79661 Pain in right lower leg: Secondary | ICD-10-CM | POA: Insufficient documentation

## 2013-05-03 DIAGNOSIS — M6740 Ganglion, unspecified site: Secondary | ICD-10-CM

## 2013-05-03 NOTE — Patient Instructions (Addendum)
The ganglian cyst in your foot is too small to inject.  Please wear the arch strap to put compression on it. If it gets bigger, please come back. We recommend getting a compression sleeve at the drug store or sporting goods store to help with the shin and calf pain. If it gets worse, please come back. If you shoulder or arm is getting worse, we would be happy to see you back for that as well.

## 2013-05-03 NOTE — Assessment & Plan Note (Addendum)
On right. Visualized on ultrasound. Very small. Will give arch strap to apply compression to the cyst. Return if enlarging for aspiration and injection.

## 2013-05-03 NOTE — Progress Notes (Signed)
Patient ID: Morgan Wood, female   DOB: 17-Mar-1976, 37 y.o.   MRN: [id number] Subjective: [name] is here for a new patient appointment for right shin pain and cyst.  She was referred by [facility].  She reports that since February, she has had an ache in her right shin.  Over the last month or so, it has started to include the right calf.  Mostly notices it when driving.  Will be more achy after prolonged walking.  Also report a cyst on her right lateral foot.  Similar to one she had injected back in September.  Sometimes causes a little ache.  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  PMHx: GERD PSHx: none FHx: HTN in father, heart disease in grandparents SHx: non smoker; no alcohol; works as a Runner, broadcasting/film/video  Allergies: azithromycin Meds: see list  Objective: BP 118/78  Pulse 75  Ht 5\' 3"  (1.6 m)  Wt 110 lb (49.896 kg)  BMI 19.49 kg/m2  LMP 04/08/2013 Gen: alert, cooperative, NAD Right shin: no erythema or swelling; some tenderness over mid anterior shin; no calf tenderness; 5/5 strength in plantar and dorsi-flexion Right foot: small cyst visible only with dorsi-flexion of the foot; normal foot architecture  [facility] ultrasound: There is a tiny hypoechoic area over the dorsal lateral foot consistent with a very small ganglion cyst. It is really only noticed when the foot is in a dorsiflexed position.  A/P: 37 yo woman with small ganglion cyst and right shin pain. See problem list for specifics.

## 2013-05-03 NOTE — Assessment & Plan Note (Signed)
On cortical disruption on ultrasound to indicate stress fracture. Exam is benign. Recommend compression sleeve for comfort. Follow up if worsening.

## 2013-05-11 ENCOUNTER — Ambulatory Visit (INDEPENDENT_AMBULATORY_CARE_PROVIDER_SITE_OTHER): Payer: BC Managed Care – PPO

## 2013-05-11 DIAGNOSIS — J309 Allergic rhinitis, unspecified: Secondary | ICD-10-CM

## 2013-05-14 ENCOUNTER — Encounter: Payer: Self-pay | Admitting: Certified Nurse Midwife

## 2013-05-23 ENCOUNTER — Encounter: Payer: Self-pay | Admitting: Certified Nurse Midwife

## 2013-05-24 ENCOUNTER — Ambulatory Visit (INDEPENDENT_AMBULATORY_CARE_PROVIDER_SITE_OTHER): Payer: BC Managed Care – PPO | Admitting: Gynecology

## 2013-05-24 ENCOUNTER — Ambulatory Visit: Payer: BC Managed Care – PPO | Admitting: Certified Nurse Midwife

## 2013-05-24 ENCOUNTER — Encounter: Payer: Self-pay | Admitting: Gynecology

## 2013-05-24 VITALS — BP 110/62 | HR 70 | Resp 18 | Ht 63.0 in | Wt 110.0 lb

## 2013-05-24 DIAGNOSIS — N841 Polyp of cervix uteri: Secondary | ICD-10-CM

## 2013-05-24 DIAGNOSIS — Z0000 Encounter for general adult medical examination without abnormal findings: Secondary | ICD-10-CM

## 2013-05-24 DIAGNOSIS — Z01419 Encounter for gynecological examination (general) (routine) without abnormal findings: Secondary | ICD-10-CM

## 2013-05-24 LAB — POCT URINALYSIS DIPSTICK: pH, UA: 5

## 2013-05-24 LAB — HEMOGLOBIN, FINGERSTICK: Hemoglobin, fingerstick: 13.7 g/dL (ref 12.0–16.0)

## 2013-05-24 NOTE — Progress Notes (Signed)
37 y.o. Single Caucasian female   G0P0000 here for annual exam. Pt is not currently sexually active.  Pt reports pain under left breast for several years, unchanged, had been checked in past, evaluation negative-including mammogram and endoscopy.  Menses are regular, on ocp for dysmenorrhea, was using tylenol with limited success, had been taken off NSAIDS due to some GI issues which have since resolved.  Patient's last menstrual period was 04/10/2013.          Sexually active: no  The current method of family planning is none.    Exercising: yes  softball 3-4x/wk Last pap: 05/23/12 ASCUS - HPV Alcohol: no Tobacco: no Mammogram: 03/07/13 BSE: yes  Hgb:  13.7       Urine: Trace Leuks; Trace Protein   Health Maintenance  Topic Date Due  . Influenza Vaccine  07/02/2013  . Pap Smear  05/24/2015  . Tetanus/tdap  02/18/2018    Family History  Problem Relation Age of Onset  . Diabetes Other   . Heart disease Other   . Cancer Maternal Grandfather     skin cancer  . Heart attack Maternal Grandfather   . Emphysema Maternal Grandfather   . Parkinsonism Paternal Grandmother   . Heart attack Paternal Grandfather   . Hypertension Father   . Cancer Maternal Grandmother     Patient Active Problem List   Diagnosis Date Noted  . Pain in right shin 05/03/2013  . Peroneal ganglion cyst 05/03/2013  . Chest pain 02/14/2013  . Allergic rhinitis due to pollen 03/14/2011  . RHINOSINUSITIS, ACUTE 11/04/2008  . ASTHMATIC BRONCHITIS, ACUTE 11/30/2007    Past Medical History  Diagnosis Date  . Allergic rhinitis   . Asthma   . Headache(784.0)   . Environmental allergies     rash,swelling in throat,itching  . Bronchitis     1-2 episodes yearly  . Laryngitis     2-3episodes yearly    Past Surgical History  Procedure Laterality Date  . Nasal septum surgery  11/01/2002  . Wisdom tooth extraction    . Eus  10/13/2012    Procedure: UPPER ENDOSCOPIC ULTRASOUND (EUS) LINEAR;  Surgeon: Theda Belfast, MD;  Location: [facility];  Service: Endoscopy;  Laterality: N/A;  . Bulging disc c3 c4  2006    Allergies: Azithromycin  Current Outpatient Prescriptions  Medication Sig Dispense Refill  . albuterol (PROVENTIL HFA) 108 (90 BASE) MCG/ACT inhaler Inhale 2 puffs into the lungs every 4 (four) hours as needed for wheezing or shortness of breath.  1 Inhaler  prn  . Cetirizine HCl (ZYRTEC PO) Take by mouth as needed.       Marland Kitchen EPINEPHrine (EPIPEN 2-PAK) 0.3 mg/0.3 mL DEVI Inject 0.3 mLs (0.3 mg total) into the muscle once. As needed for severe allergic reaction  1 Device  prn  . GABAPENTIN, PHN, PO Take by mouth as needed.      . meloxicam (MOBIC) 15 MG tablet Take 1 tablet (15 mg total) by mouth daily.  30 tablet  1  . mometasone (NASONEX) 50 MCG/ACT nasal spray Place 2 sprays into the nose daily.      Marland Kitchen omeprazole (PRILOSEC) 20 MG capsule Take 20 mg by mouth daily.      . Polyethylene Glycol 3350 (MIRALAX PO) Take by mouth.      Marland Kitchen UNABLE TO FIND once a week. Med Name: Allergy Shots      . valACYclovir (VALTREX) 1000 MG tablet Take 2 tablets as first dose at onset  and repeat 12 hours prn outbreak  30 tablet  12   No current facility-administered medications for this visit.    ROS: Pertinent items are noted in HPI.  Exam:    BP 110/62  Pulse 70  Resp 18  Ht 5\' 3"  (1.6 m)  Wt 110 lb (49.896 kg)  BMI 19.49 kg/m2  LMP 04/10/2013 Weight change: @WEIGHTCHANGE @ Last 3 height recordings:  Ht Readings from Last 3 Encounters:  05/24/13 5\' 3"  (1.6 m)  05/03/13 5\' 3"  (1.6 m)  03/15/13 5\' 3"  (1.6 m)   General appearance: alert, cooperative and appears stated age Head: Normocephalic, without obvious abnormality, atraumatic Neck: no adenopathy, no carotid bruit, no JVD, supple, symmetrical, trachea midline and thyroid not enlarged, symmetric, no tenderness/mass/nodules Lungs: clear to auscultation bilaterally Breasts: normal appearance, no masses or tenderness Heart: regular rate and  rhythm, S1, S2 normal, no murmur, click, rub or gallop Abdomen: soft, non-tender; bowel sounds normal; no masses,  no organomegaly Extremities: extremities normal, atraumatic, no cyanosis or edema Skin: Skin color, texture, turgor normal. No rashes or lesions Lymph nodes: Cervical, supraclavicular, and axillary nodes normal. no inguinal nodes palpated Neurologic: Grossly normal   Pelvic: External genitalia:  no lesions              Urethra: normal appearing urethra with no masses, tenderness or lesions              Bartholins and Skenes: normal                 Vagina: normal appearing vagina with normal color and discharge, no lesions              Cervix: endocervical polyp protruding through cervix              Pap taken: no        Bimanual Exam:  Uterus:  uterus is normal size, shape, consistency and nontender, retroverted                                      Adnexa:    normal adnexa in size, nontender and no masses                                      Rectovaginal: Confirms                                      Anus:  normal sphincter tone, no lesions  A: well woman Endocervical polyp History of ASCUS, neg HRHPV     P:  pap smear guidelines reviewed, repeat 2016-pt informed Endocervical polyp-pt informed, cervix treated with xylocaine jelly, polyp grasped and removed, no bleeding noted, sent to pathology return annually or prn   An After Visit Summary was printed and given to the patient.

## 2013-05-28 LAB — IPS CERVICAL/ECC/EMB/VULVAR/VAGINAL BIOPSY

## 2013-06-07 ENCOUNTER — Ambulatory Visit: Payer: BC Managed Care – PPO | Admitting: Certified Nurse Midwife

## 2013-08-14 ENCOUNTER — Encounter: Payer: Self-pay | Admitting: Gynecology

## 2013-09-06 ENCOUNTER — Other Ambulatory Visit: Payer: Self-pay

## 2013-09-17 ENCOUNTER — Ambulatory Visit (INDEPENDENT_AMBULATORY_CARE_PROVIDER_SITE_OTHER): Payer: BC Managed Care – PPO | Admitting: Internal Medicine

## 2013-09-17 ENCOUNTER — Encounter: Payer: Self-pay | Admitting: Internal Medicine

## 2013-09-17 VITALS — BP 96/68 | HR 79 | Ht 63.0 in | Wt 113.2 lb

## 2013-09-17 DIAGNOSIS — J209 Acute bronchitis, unspecified: Secondary | ICD-10-CM

## 2013-09-17 DIAGNOSIS — J301 Allergic rhinitis due to pollen: Secondary | ICD-10-CM

## 2013-09-17 DIAGNOSIS — Z23 Encounter for immunization: Secondary | ICD-10-CM

## 2013-09-17 MED ORDER — EPINEPHRINE 0.3 MG/0.3ML IJ SOAJ: INTRAMUSCULAR | Status: DC

## 2013-09-17 MED ORDER — ALBUTEROL SULFATE HFA 108 (90 BASE) MCG/ACT IN AERS: 2.0000 | INHALATION_SPRAY | RESPIRATORY_TRACT | Status: DC | PRN

## 2013-09-17 NOTE — Patient Instructions (Signed)
Flu vax  Sample Dymista nasal spray    Try 1-2 puffs each nostril once every day at bedtime  Scripts for Epipen and rescue inhaler  For dryness- consider otc Saline nasal gel, Biotene or a room humidifier  We can continue allergy vaccine 1:10 GO  Please call as needed

## 2013-09-17 NOTE — Progress Notes (Signed)
Patient ID: Morgan Wood, female    DOB: 04/27/1976, 37 y.o.   MRN: [id number]  HPI 03/08/11- 42 yoF  Never smoker, followed for rhinosinusitis and asthmatic bronchitis.  Last here Sep 07, 2010. She got through the winter well. She credits her allergy shots for an unusually good spring, with little stuffiness. Taking only an occasional Z pak  if she plans to be outside.  She uses Qvar in stretches, but complains of cost. Asks about using her rescue inhaler instead.  Recognizes some of her chest tightness is due to anxiety.   09/06/11- 35 yoF  Never smoker, followed for rhinosinusitis and asthmatic bronchitis.  She should cough one head cold earlier in the summer but since then has felt well. 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 She is doing well now with allergy vaccine at 1:10 and she is not interested in making any changes. She does use Neti pot when needed.   03/13/12- 35 yoF  Never smoker, followed for rhinosinusitis and asthmatic bronchitis. Wheezing is worse at night when laying down. Incidentally noted increased wheeze over the last 2 months. Chronic pressure sensation under her lower left anterior costal margin. We discussed anatomy in that area. Her primary physician gave gabapentin "to calm my nerves", and I think she considers herself an anxious person.  09/14/12- 36 yoF  Never smoker, followed for rhinosinusitis and asthmatic bronchitis. Continues allergy vaccine 1:10 GO Recent prednisone for sinusitis. Frequent colds. 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  02/14/2013 acute ov/Wert  Chief Complaint  Patient presents with  . Acute Visit    Pt c/o SOB with or without exertion x 3 days. She is also having dull, achy pain under left breast that radiates to her back. Pain worse with deep inspiration and after eating.    breathing worse x one week  with nasal congestion/ green mucus then acute L pleuritic cp x 3 days "like a dull ache" made worse p eating or with deep breath, no better when lie down. No gagging or vomiting or fever. Not on bcps, no h/o travel, leg symptoms Sleeping ok without nocturnal  or early am exacerbation  of respiratory  c/o's or need for noct saba. Also denies any obvious fluctuation of symptoms with weather or environmental changes or other aggravating or alleviating factors except as outlined above   03/15/13-   37 yoF  Never smoker, followed for rhinosinusitis and asthmatic bronchitis. FOLLOWS FOR: had flare up allergies mid of April-seen by MW. 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 Continues allergy vaccine 1:10 GO. Has done well since her acute visit in April. We again talked about her chronic pressure sensation in the left upper quadrant/left lower anterior costal margin. There has been no change. It does not seem to be a respiratory pattern. CXR 02/14/13  IMPRESSION:  Hyperinflated lungs.  No acute abnormalities.  Original Report Authenticated By: Ulyses Southward, M.  09/17/13- 37 yoF  Never smoker, followed for rhinosinusitis and asthmatic bronchitis Follows for- 78month rov.  Pt c/o sinus congestion, postnasal drip, mostly dry cough, runny nose. Continues Allergy Vaccine 1:10 GO, doing well. She feels it is worth continuing. Incidental postnasal drip with a recent sore throat. No headache. Still some nasal congestion.   ROS-see HPI Constitutional:   No-   weight loss, night sweats,  fevers, chills, fatigue, lassitude. HEENT:   No-  headaches, difficulty swallowing, tooth/dental problems, +sore throat,       No-  sneezing, itching, ear ache, +nasal congestion, +post nasal drip,  CV:  No-   chest pain, orthopnea, PND, swelling in lower extremities, anasarca, dizziness, palpitations Resp: No-   shortness of breath with exertion or at rest.              No-   productive cough,  No  non-productive cough,  No- coughing up of blood.              No-   change in color of mucus.  No- wheezing.   Skin: No-   rash or lesions. GI:  No-   heartburn, indigestion, abdominal pain, nausea, vomiting,  GU: . MS:  No-   joint pain or swelling.   Neuro-     nothing unusual Psych:  No- change in mood or affect. No depression or anxiety.  No memory loss.  Objective:   Physical Exam amb wf nad General- Alert, Oriented, Affect-appropriate, Distress- none acute Skin- rash-none, lesions- none, excoriation- none Lymphadenopathy- none Head- atraumatic            Eyes- Gross vision intact, PERRLA, conjunctivae clear secretions            Ears- Hearing, canals-normal, TMs clear            Nose- + turbinate edema, no-Septal dev, polyps, erosion, perforation             Throat- Mallampati II , mucosa clear , drainage- none, tonsils- present Neck- flexible , trachea midline, no stridor , thyroid nl, carotid no bruit Chest - symmetrical excursion , unlabored           Heart/CV- RRR , no murmur , no gallop  , no rub, nl s1 s2                            JVD- trace/ fills from above , edema- none, stasis changes- none, varices- none           Lung- clear to P&A, wheeze- none, cough- none , dullness-none, rub- none           Chest wall-  Abd-  Soft, no tenderness, with particular attention to left upper quadrant  Extrem- cyanosis- none, clubbing, none, atrophy- none, strength- nl

## 2013-10-01 NOTE — Assessment & Plan Note (Signed)
Probably vasomotor and allergic rhinitis as well as a resolving upper respiratory infection We can continue allergy vaccine. Plan-try sample Dymista nasal spray, refill EpiPen and rescue inhaler

## 2013-10-01 NOTE — Assessment & Plan Note (Signed)
Plan-refill rescue inhaler with medication talk

## 2013-10-23 ENCOUNTER — Telehealth: Payer: Self-pay | Admitting: Internal Medicine

## 2013-10-23 MED ORDER — PREDNISONE 10 MG PO TABS: ORAL_TABLET | ORAL | Status: DC

## 2013-10-23 NOTE — Telephone Encounter (Signed)
Per CY-give Prednisone 10 mg #18 take 3 x 3 days, 2 x 3 days, 1 x 2 days no refills.

## 2013-10-23 NOTE — Telephone Encounter (Signed)
Spoke with pt. States that she has been wheezing for 2 weeks. Feels like she always needs to take a deep breath but can't. She has not been using albuterol due to it making her jittery. Wants to know how to proceed.  Allergies  Allergen Reactions  . Azithromycin Other (See Comments)    Stomach cramps    Current Outpatient Prescriptions on File Prior to Visit  Medication Sig Dispense Refill  . albuterol (PROVENTIL HFA) 108 (90 BASE) MCG/ACT inhaler Inhale 2 puffs into the lungs every 4 (four) hours as needed for wheezing or shortness of breath.  1 Inhaler  prn  . Cetirizine HCl (ZYRTEC PO) Take by mouth as needed.       Marland Kitchen EPINEPHrine (EPI-PEN) 0.3 mg/0.3 mL SOAJ injection For severe allergic reaction  1 Device  prn  . Polyethylene Glycol 3350 (MIRALAX PO) Take by mouth as needed.       Marland Kitchen UNABLE TO FIND once a week. Med Name: Allergy Shots       No current facility-administered medications on file prior to visit.    CY - please advise. Thanks.

## 2013-10-23 NOTE — Telephone Encounter (Signed)
Rx has been sent in. Pt is aware. 

## 2013-10-24 ENCOUNTER — Telehealth: Payer: Self-pay | Admitting: Internal Medicine

## 2013-10-24 ENCOUNTER — Ambulatory Visit: Payer: BC Managed Care – PPO

## 2013-10-24 ENCOUNTER — Ambulatory Visit (INDEPENDENT_AMBULATORY_CARE_PROVIDER_SITE_OTHER): Payer: BC Managed Care – PPO | Admitting: Internal Medicine

## 2013-10-24 VITALS — BP 114/75 | HR 96 | Temp 97.3°F | Resp 22 | Ht 63.0 in | Wt 109.0 lb

## 2013-10-24 DIAGNOSIS — R0602 Shortness of breath: Secondary | ICD-10-CM

## 2013-10-24 DIAGNOSIS — J45909 Unspecified asthma, uncomplicated: Secondary | ICD-10-CM

## 2013-10-24 MED ORDER — LEVOFLOXACIN 500 MG PO TABS: 500.0000 mg | ORAL_TABLET | Freq: Every day | ORAL | Status: DC

## 2013-10-24 MED ORDER — LEVALBUTEROL TARTRATE 45 MCG/ACT IN AERO: 2.0000 | INHALATION_SPRAY | Freq: Four times a day (QID) | RESPIRATORY_TRACT | Status: DC | PRN

## 2013-10-24 NOTE — Telephone Encounter (Signed)
Phone note 10/23/13: we call in pred taper for pt.   I called and spoke with pt. She reports she woke up this AM feeling very SOB, chest feels very heavy and she is coughing a lot. She feels like she is worse than yesterday. She does not feel like she can take a deep breathe. I advised pt if she is worse and can't breathe then she needed to go to the ER. She will do so. Nothing further needed

## 2013-10-24 NOTE — Patient Instructions (Signed)
Take prednisone 4/4/3/3/2/2 single daily dose for 6 days

## 2013-10-24 NOTE — Progress Notes (Signed)
   Subjective:    Patient ID: Morgan Wood, female    DOB: 12-Aug-1976, 37 y.o.   MRN: [id number]  HPI  37 y.o. Female presents to clinic with shortness of breath and wheezing that has progressed over the past 3 weeks, despite intermittent albuterol. Denies fever, night sweats and chills. Was prescribed prednisone 10mg  3 times a day for 3 days 3 times a day for 3 days, twice a day for 3 days and, then daily for 3 days yesterday by Dr. Maple Hudson after calling in with her symptoms but has only taken 2 pills thus far. Able to breath through nose at night. States that the breathing is making her lightheaded. Has tried albuterol; last taken on Sunday and states that it made her jittery and didn't help symptoms. She is reluctant to use this because of jitteriness. She has the sense of needing to take a deep breath frequently, almost air hunger    Review of Systems  Constitutional: Negative for fever, chills, appetite change and fatigue.  HENT: Negative for ear pain, rhinorrhea, sinus pressure, sore throat and trouble swallowing.   Respiratory:       Has had a history of recurrent fullness or feeling of swelling with mild pain in the left upper quadrant and left lower anterior chest wall. This has been evaluated with endoscopy and cardiac evaluations and everything is normal.  Cardiovascular: Negative for chest pain, palpitations and leg swelling.  Neurological: Negative for headaches.       Objective:   Physical Exam BP 114/75  Pulse 96  Temp(Src) 97.3 F (36.3 C)  Resp 22  Ht 5\' 3"  (1.6 m)  Wt 109 lb (49.442 kg)  BMI 19.31 kg/m2  SpO2 100% HEENT is clear including no lymphadenopathy or thyromegaly Heart regular without murmur Lungs are clear with no rales or rhonchi no wheezing on forced expiration She is symptomatically better than she was 1 hour ago when she took her second dose of prednisone Abdomen benign Extremities clear  [facility] reading (PRIMARY) by  Dr.Delan Ksiazek= no consolidation/and  no effusion/no pneumothorax/? Mild increased markings right lower lung versus Breast shadow        Assessment & Plan:  Problem #1 shortness of breath Problem #2 asthma exacerbation  Advise changing prednisone to 40/40/ 30/30/ 20/20 single daily doses Change inhaler Xopenex Hold off on antibiotics unless develops fever/sputum and then due to her GI reaction to Zithromax would use Levaquin

## 2013-11-19 ENCOUNTER — Ambulatory Visit (INDEPENDENT_AMBULATORY_CARE_PROVIDER_SITE_OTHER): Payer: BC Managed Care – PPO

## 2013-11-19 DIAGNOSIS — J309 Allergic rhinitis, unspecified: Secondary | ICD-10-CM

## 2013-12-26 ENCOUNTER — Ambulatory Visit (INDEPENDENT_AMBULATORY_CARE_PROVIDER_SITE_OTHER): Payer: BC Managed Care – PPO | Admitting: Gynecology

## 2013-12-26 ENCOUNTER — Encounter: Payer: Self-pay | Admitting: Gynecology

## 2013-12-26 ENCOUNTER — Telehealth: Payer: Self-pay | Admitting: Gynecology

## 2013-12-26 VITALS — BP 117/83 | HR 84 | Resp 18 | Ht 63.0 in | Wt 113.0 lb

## 2013-12-26 DIAGNOSIS — N949 Unspecified condition associated with female genital organs and menstrual cycle: Secondary | ICD-10-CM

## 2013-12-26 DIAGNOSIS — R102 Pelvic and perineal pain: Secondary | ICD-10-CM

## 2013-12-26 LAB — POCT URINALYSIS DIPSTICK
Bilirubin, UA: NEGATIVE
Glucose, UA: NEGATIVE
Leukocytes, UA: NEGATIVE
NITRITE UA: NEGATIVE
PH UA: 6
Protein, UA: NEGATIVE
RBC UA: NEGATIVE
UROBILINOGEN UA: NEGATIVE

## 2013-12-26 NOTE — Telephone Encounter (Signed)
Patient is calling French Anaracy back. Call between 12:45 and 1:30

## 2013-12-26 NOTE — Telephone Encounter (Signed)
Patient says she  is "having lower left side pain that wraps around her back". Patient says this pain in below her belly button. Patient would like an appointment today.

## 2013-12-26 NOTE — Telephone Encounter (Signed)
Spoke with patient and appointment scheduled for today at 1730 with Dr. Farrel GobbleLathrop, agreeable to plan.  Routing to provider for final review. Patient agreeable to disposition. Will close encounter

## 2013-12-26 NOTE — Telephone Encounter (Signed)
Message left to return call to Junior Kenedy at [phone].    

## 2013-12-26 NOTE — Progress Notes (Signed)
Subjective:     Patient ID: Morgan Wood, female   DOB: [birth year], 38 y.o.   MRN: [id number]  HPI Comments: Pt reporting LLQ pain for a few weeks.  She thought that is may be menstrual related but her cycle ended last week and the pain is still present.  Pt reports pain can be sharp, can radiate to back and towards umbilicus.  Pt reports some issues with gas, she started miralax but denies constipation.  Pt reports this last cycle was lighter than usual but was on time.  Pt is not sexually active for some time. Pt was on ocp for dysmenorrhea but stopped a few years ago and cycles are ok, no history of ovarian cysts.  Pt recently started bowling, right hand, 11# ball  Pelvic Pain The patient's primary symptoms include pelvic pain. The patient's pertinent negatives include no vaginal discharge. Pertinent negatives include no chills, constipation, diarrhea, fever, nausea or vomiting.     Review of Systems  Constitutional: Negative for fever, chills and fatigue.  Gastrointestinal: Negative for nausea, vomiting, diarrhea and constipation.  Genitourinary: Positive for pelvic pain. Negative for vaginal bleeding, vaginal discharge, vaginal pain and menstrual problem.       Objective:   Physical Exam  Nursing note and vitals reviewed. Constitutional: She is oriented to person, place, and time. She appears well-developed and well-nourished.  Neurological: She is alert and oriented to person, place, and time.  Skin: Skin is warm and dry.  BP 117/83  Pulse 84  Resp 18  Ht 5\' 3"  (1.6 m)  Wt 113 lb (51.256 kg)  BMI 20.02 kg/m2  LMP 12/12/2013 General appearance: alert, cooperative and appears stated age Back: no CVAT Abdomen: no guarding, rebound, muscle twitch at iliac crest on left with reflex at sternum Pelvic: cervix normal in appearance, external genitalia normal, no adnexal masses or tenderness, no cervical motion tenderness, uterus normal size, shape, and consistency and vagina normal  without discharge U/a dip neg    Assessment:     Nonspecific pain-LLQ      Plan:     Pt with reflexic muscle twitch with palpation of pelvic brim on left but not mid abdomen  Pelvic exam normal Pt will restart bowling next week, if symptoms get worse will consider muscle strain, if not PUS-pt to call Has appt with GI tomorrow

## 2014-01-02 ENCOUNTER — Encounter: Payer: Self-pay | Admitting: Gynecology

## 2014-01-02 ENCOUNTER — Other Ambulatory Visit: Payer: Self-pay | Admitting: Gynecology

## 2014-01-02 DIAGNOSIS — R102 Pelvic and perineal pain: Secondary | ICD-10-CM

## 2014-01-07 ENCOUNTER — Telehealth: Payer: Self-pay | Admitting: Gynecology

## 2014-01-07 NOTE — Telephone Encounter (Signed)
Left message for patient to call back. Need to discuss insurance information received and schedule PUS.

## 2014-01-07 NOTE — Telephone Encounter (Signed)
Patient returned call. I advised patient of $30 copay as quoted for PUS. Scheduled PUS. Advised patient of cancellation policy and cancellation fee. Patient agreeable

## 2014-01-14 ENCOUNTER — Telehealth: Payer: Self-pay | Admitting: Gynecology

## 2014-01-14 NOTE — Telephone Encounter (Signed)
Left message for patient to call back. Want to offer PUS appointment tomorrow.

## 2014-01-22 ENCOUNTER — Ambulatory Visit (INDEPENDENT_AMBULATORY_CARE_PROVIDER_SITE_OTHER): Payer: BC Managed Care – PPO

## 2014-01-22 ENCOUNTER — Ambulatory Visit (INDEPENDENT_AMBULATORY_CARE_PROVIDER_SITE_OTHER): Payer: BC Managed Care – PPO | Admitting: Gynecology

## 2014-01-22 ENCOUNTER — Encounter: Payer: Self-pay | Admitting: Gynecology

## 2014-01-22 DIAGNOSIS — N949 Unspecified condition associated with female genital organs and menstrual cycle: Secondary | ICD-10-CM

## 2014-01-22 DIAGNOSIS — R102 Pelvic and perineal pain: Secondary | ICD-10-CM

## 2014-01-22 NOTE — Progress Notes (Signed)
      Pt here for u/s to evaluate left lower quadrant pain.  Pain thought to be muscular skeletal related to her bowling but pt initially reported no change with activity now she thinks it may be exacerbated by bowling.  She recently started softball and again felt the discomfort.   Pt was seen by Dr Loreta AveMann and diagnosed with constipation, she was started on miralax and has added some additional fiber. U/s images reviewed, all normal uterus, trilayered EMS, follicle on right, left negative. Pt offered referral to PT for eval and treatment, we also discussed spigelian hernia, signs and symptoms reviewed.   Pt declines eval for above and will try to see if treating constipation improves symptoms, if not we will refer her out.  She is agreeable.  8128m spent discussing LLQ pain differential, >50% face to face

## 2014-01-25 ENCOUNTER — Telehealth: Payer: Self-pay | Admitting: Gynecology

## 2014-01-25 ENCOUNTER — Ambulatory Visit (INDEPENDENT_AMBULATORY_CARE_PROVIDER_SITE_OTHER): Payer: BC Managed Care – PPO | Admitting: Gynecology

## 2014-01-25 ENCOUNTER — Encounter: Payer: Self-pay | Admitting: Gynecology

## 2014-01-25 VITALS — BP 116/86 | Temp 98.2°F | Resp 14 | Ht 63.0 in | Wt 113.0 lb

## 2014-01-25 DIAGNOSIS — R300 Dysuria: Secondary | ICD-10-CM

## 2014-01-25 DIAGNOSIS — N3000 Acute cystitis without hematuria: Secondary | ICD-10-CM

## 2014-01-25 LAB — POCT URINALYSIS DIPSTICK
Nitrite, UA: POSITIVE
UROBILINOGEN UA: NEGATIVE
pH, UA: 5

## 2014-01-25 MED ORDER — CIPROFLOXACIN HCL 500 MG PO TABS: 500.0000 mg | ORAL_TABLET | Freq: Two times a day (BID) | ORAL | Status: DC

## 2014-01-25 MED ORDER — PHENAZOPYRIDINE HCL 200 MG PO TABS: 200.0000 mg | ORAL_TABLET | Freq: Three times a day (TID) | ORAL | Status: DC | PRN

## 2014-01-25 NOTE — Progress Notes (Signed)
Subjective:     Patient ID: Morgan Wood, female   DOB: 12/15/1975, 38 y.o.   MRN: [id number]  Dysuria  This is a new problem. The current episode started today. The problem occurs every urination. The problem has been rapidly worsening. The quality of the pain is described as burning and stabbing. The pain is severe. There has been no fever. She is not sexually active. There is no history of pyelonephritis. Associated symptoms include frequency, hematuria, hesitancy and urgency. Pertinent negatives include no chills, discharge, flank pain, nausea, possible pregnancy, sweats or vomiting. She has tried nothing for the symptoms. There is no history of kidney stones, recurrent UTIs, urinary stasis or a urological procedure.     Review of Systems  Constitutional: Negative for chills.  Gastrointestinal: Negative for nausea and vomiting.  Genitourinary: Positive for dysuria, hesitancy, urgency, frequency and hematuria. Negative for flank pain.       Objective:   Physical Exam  Nursing note and vitals reviewed. Constitutional: She appears well-developed and well-nourished.  Abdominal: Soft. Normal appearance. There is no tenderness. There is CVA tenderness (right).  Genitourinary: Vagina normal. Cervix exhibits no discharge. No vaginal discharge found.  Lymphadenopathy:       Right: No inguinal adenopathy present.       Left: No inguinal adenopathy present.  U/A: + protein, LE, nitrites, blood     Assessment:     Acute cystitis     Plan:     U/A, UCx Pyridium, cipro Increase fluids avs given

## 2014-01-25 NOTE — Telephone Encounter (Signed)
Spoke with patient. Patient is experiencing painful urination that is new in onset. Pain started this morning with first urination and has continued. Pain is in the pelvic area and is radiating to her back. States back pain comes and goes but the pelvic pain begin this morning. Urine is pink in color which also began this morning. Advised to come in for an office visit with Dr.Lathrop today. Requesting afternoon appointment due to work schedule. Spoke with Dr.Lathrop 1500 appointment approved for this patient.

## 2014-01-25 NOTE — Telephone Encounter (Signed)
Spoke with patient. Advised appointment available today at 1500 with Dr.Lathrop. Patient would like to schedule at this time. Appointment made for today at 1500 time per Dr.Lathrop.  Routing to provider for final review. Patient agreeable to disposition. Will close encounter

## 2014-01-25 NOTE — Telephone Encounter (Signed)
Pt thinks she may have a uti because it hurts really bad when she goes to the bathroom. Offered patient an appointment she declined and insists on speaking with the nurse first.

## 2014-01-25 NOTE — Patient Instructions (Signed)
Urinary Tract Infection  Urinary tract infections (UTIs) can develop anywhere along your urinary tract. Your urinary tract is your body's drainage system for removing wastes and extra water. Your urinary tract includes two kidneys, two ureters, a bladder, and a urethra. Your kidneys are a pair of bean-shaped organs. Each kidney is about the size of your fist. They are located below your ribs, one on each side of your spine.  CAUSES  Infections are caused by microbes, which are microscopic organisms, including fungi, viruses, and bacteria. These organisms are so small that they can only be seen through a microscope. Bacteria are the microbes that most commonly cause UTIs.  SYMPTOMS   Symptoms of UTIs may vary by age and gender of the patient and by the location of the infection. Symptoms in young women typically include a frequent and intense urge to urinate and a painful, burning feeling in the bladder or urethra during urination. Older women and men are more likely to be tired, shaky, and weak and have muscle aches and abdominal pain. A fever may mean the infection is in your kidneys. Other symptoms of a kidney infection include pain in your back or sides below the ribs, nausea, and vomiting.  DIAGNOSIS  To diagnose a UTI, your caregiver will ask you about your symptoms. Your caregiver also will ask to provide a urine sample. The urine sample will be tested for bacteria and white blood cells. White blood cells are made by your body to help fight infection.  TREATMENT   Typically, UTIs can be treated with medication. Because most UTIs are caused by a bacterial infection, they usually can be treated with the use of antibiotics. The choice of antibiotic and length of treatment depend on your symptoms and the type of bacteria causing your infection.  HOME CARE INSTRUCTIONS   If you were prescribed antibiotics, take them exactly as your caregiver instructs you. Finish the medication even if you feel better after you  have only taken some of the medication.   Drink enough water and fluids to keep your urine clear or pale yellow.   Avoid caffeine, tea, and carbonated beverages. They tend to irritate your bladder.   Empty your bladder often. Avoid holding urine for long periods of time.   Empty your bladder before and after sexual intercourse.   After a bowel movement, women should cleanse from front to back. Use each tissue only once.  SEEK MEDICAL CARE IF:    You have back pain.   You develop a fever.   Your symptoms do not begin to resolve within 3 days.  SEEK IMMEDIATE MEDICAL CARE IF:    You have severe back pain or lower abdominal pain.   You develop chills.   You have nausea or vomiting.   You have continued burning or discomfort with urination.  MAKE SURE YOU:    Understand these instructions.   Will watch your condition.   Will get help right away if you are not doing well or get worse.  Document Released: 07/28/2005 Document Revised: 04/18/2012 Document Reviewed: 11/26/2011  Exit[employer] Patient Information 2014 [employer].

## 2014-01-26 LAB — URINALYSIS, MICROSCOPIC ONLY
CASTS: NONE SEEN
CRYSTALS: NONE SEEN

## 2014-01-27 ENCOUNTER — Encounter: Payer: Self-pay | Admitting: Gynecology

## 2014-01-28 ENCOUNTER — Encounter: Payer: Self-pay | Admitting: Gynecology

## 2014-01-28 LAB — URINE CULTURE

## 2014-02-04 ENCOUNTER — Encounter: Payer: Self-pay | Admitting: Gynecology

## 2014-02-24 ENCOUNTER — Ambulatory Visit (INDEPENDENT_AMBULATORY_CARE_PROVIDER_SITE_OTHER): Payer: BC Managed Care – PPO | Admitting: Family Medicine

## 2014-02-24 ENCOUNTER — Ambulatory Visit: Payer: BC Managed Care – PPO

## 2014-02-24 VITALS — BP 122/70 | HR 80 | Temp 98.0°F | Resp 16 | Ht 63.0 in | Wt 109.6 lb

## 2014-02-24 DIAGNOSIS — R1032 Left lower quadrant pain: Secondary | ICD-10-CM

## 2014-02-24 DIAGNOSIS — R319 Hematuria, unspecified: Secondary | ICD-10-CM

## 2014-02-24 DIAGNOSIS — K219 Gastro-esophageal reflux disease without esophagitis: Secondary | ICD-10-CM

## 2014-02-24 DIAGNOSIS — K5900 Constipation, unspecified: Secondary | ICD-10-CM

## 2014-02-24 LAB — POCT URINALYSIS DIPSTICK
BILIRUBIN UA: NEGATIVE
Glucose, UA: NEGATIVE
KETONES UA: NEGATIVE
Leukocytes, UA: NEGATIVE
Nitrite, UA: NEGATIVE
PH UA: 7
PROTEIN UA: NEGATIVE
Spec Grav, UA: 1.01
Urobilinogen, UA: 0.2

## 2014-02-24 LAB — POCT UA - MICROSCOPIC ONLY
CRYSTALS, UR, HPF, POC: NEGATIVE
Casts, Ur, LPF, POC: NEGATIVE
MUCUS UA: NEGATIVE
Yeast, UA: NEGATIVE

## 2014-02-24 NOTE — Progress Notes (Addendum)
Subjective:    Patient ID: Morgan Wood, female    DOB: Nov 29, 1975, 38 y.o.   MRN: [id number]  Abdominal Pain Associated symptoms include constipation, dysuria, frequency and nausea. Pertinent negatives include no arthralgias, diarrhea, fever, hematuria, myalgias or vomiting.   Chief Complaint  Patient presents with  . Abdominal Pain    x 2 mth   This chart was scribed for Ethelda ChickKristi M Wilburn Keir, MD by Andrew Auaven Small, ED Scribe. This patient was seen in room 9 and the patient's care was started at 3:59 PM.  HPI Comments: Morgan Wood is a 38 y.o. female who presents to the [facility] complaining of intermittent LLQ abdominal pain onset 2 months. Pt reports that sharp stabbing pain and at time a dull ache that last a couple of seconds.  Pt rates the pain 7 most days but at times 5/10. She reports that this week pain has move to the RLQ.  She denies being in pain at this time but does feel discomfort in stomach. She reports UTI, for her first time, for the past week with dysuria and frequency; s/p treatment by GYN with improvement and resolution of symptoms. She reports abdominal pain worsens with stress, sitting, stretching in bed, and getting out of low cars. She reports that she was stressed 4 days about work. She reports worsened pain 1 day ago but denies being stressed. Pt reports intermittent nausea, intermittent, pain radiating to back, darker stools and chronic constipation. Pt denies hip pain, diarrhea, emesis, blood in stool, fluctuance, weight change, fever and chills. and appetite change. Pt reports that she has talked about abdominal pain to GI specialist and OBGYN and has received a pelvic ultrasound.  Pt denies being pregnant. Pt reports that the pain began when she started bowling two month ago. Pt reports taking tylenol without relief to pain. Pt reports that she can usually have 2 BM once every 2 days. Pt reports family h/o stomach ulcers.  Eating has not affect on  pain.  Early morning, the pain can be more significant; walking around helps with pain.  No bloating.  Chronic constipation since childhood; recent improvement in constipation in the past month without improvement in abdominal pain.  No obvious mass or hernia.  No history of nephrolithiasis.  No hematuria except associated with recent UTI; large amount of blood in urine with UTI; no repeat urine to confirm resolution of hematuria.  She is currently playing softball without worsening pain with activities or following activities.  Pt reports normal menstrual cycle with last cycle being 02/08/2104. Pt denies trying to become pregnant.  Pt was seen by Dr Wilhemina CashLathrop/GYN 3/24. Pt had f/u for LLQ pain which was thought to be muscular pain. Dr.Mann diagnosed pt with constipation and prescribed miralax. She was also prescribed Dexilant to replace Prilosec OTC for GERD with good control of GERD symptoms.  Pt receive 3/24 pelvic ultrasound which resulted normal. Pt was recommend to PT and decline she wanted to be treated for constipation.   Past Medical History  Diagnosis Date  . Allergic rhinitis   . Asthma   . Headache(784.0)   . Environmental allergies     rash,swelling in throat,itching  . Bronchitis     1-2 episodes yearly  . Laryngitis     2-3episodes yearly   Past Surgical History  Procedure Laterality Date  . Nasal septum surgery  11/01/2002  . Wisdom tooth extraction    . Eus  10/13/2012    Procedure:  UPPER ENDOSCOPIC ULTRASOUND (EUS) LINEAR;  Surgeon: Theda Belfast, MD;  Location: [facility];  Service: Endoscopy;  Laterality: N/A;  . Bulging disc c3 c4  2006  . Upper gastrointestinal endoscopy  11/13; 12/13    Allergies  Allergen Reactions  . Azithromycin Other (See Comments)    Stomach cramps   Prior to Admission medications   Medication Sig Start Date End Date Taking? Authorizing Provider  Cetirizine HCl (ZYRTEC PO) Take by mouth as needed.    Yes Historical Provider, MD    EPINEPHrine (EPI-PEN) 0.3 mg/0.3 mL SOAJ injection For severe allergic reaction 09/17/13  Yes Waymon Budge, MD  levalbuterol [facility] HFA) 45 MCG/ACT inhaler Inhale 2 puffs into the lungs every 6 (six) hours as needed for wheezing. prn 10/24/13  Yes Tonye Pearson, MD  omeprazole (PRILOSEC) 10 MG capsule Take 10 mg by mouth daily.   Yes Historical Provider, MD  Polyethylene Glycol 3350 (MIRALAX PO) Take by mouth as needed.    Yes Historical Provider, MD  UNABLE TO FIND once a week. Med Name: Allergy Shots   Yes Historical Provider, MD  ciprofloxacin (CIPRO) 500 MG tablet Take 1 tablet (500 mg total) by mouth 2 (two) times daily. 01/25/14   Bennye Alm, MD  levofloxacin (LEVAQUIN) 500 MG tablet Take 1 tablet (500 mg total) by mouth daily. 10/24/13   Tonye Pearson, MD  phenazopyridine (PYRIDIUM) 200 MG tablet Take 1 tablet (200 mg total) by mouth 3 (three) times daily as needed for pain. 01/25/14   Bennye Alm, MD   History   Social History  . Marital Status: Single    Spouse Name: N/A    Number of Children: 0  . Years of Education: N/A   Occupational History  . school teacher    Social History Main Topics  . Smoking status: Never Smoker   . Smokeless tobacco: Never Used  . Alcohol Use: No  . Drug Use: No  . Sexual Activity: No   Other Topics Concern  . Not on file   Social History Narrative  . No narrative on file    Review of Systems  Constitutional: Negative for fever, chills, appetite change, fatigue and unexpected weight change.  Gastrointestinal: Positive for nausea, abdominal pain and constipation. Negative for vomiting, diarrhea, blood in stool, abdominal distention and anal bleeding.  Genitourinary: Positive for dysuria and frequency. Negative for urgency, hematuria, flank pain, vaginal bleeding, vaginal discharge, vaginal pain, menstrual problem and pelvic pain.  Musculoskeletal: Positive for back pain. Negative for arthralgias and myalgias.  Skin:  Negative for rash.      Objective:   Physical Exam  Nursing note and vitals reviewed. Constitutional: She is oriented to person, place, and time. She appears well-developed and well-nourished. No distress.  HENT:  Head: Normocephalic and atraumatic.  Eyes: Conjunctivae and EOM are normal. Pupils are equal, round, and reactive to light.  Neck: Neck supple.  Cardiovascular: Normal rate and normal heart sounds.   Pulmonary/Chest: Effort normal and breath sounds normal.  Abdominal: Soft. Bowel sounds are normal. She exhibits no distension and no mass. There is no tenderness. There is no rebound, no guarding and no CVA tenderness. No hernia.  Musculoskeletal: Normal range of motion.       Left hip: Normal. She exhibits normal range of motion, normal strength, no tenderness and no bony tenderness.       Lumbar back: Normal. She exhibits normal range of motion.  Full ROM without reproduction of pain  Full ROM in hip without reproduction of pain Change in post from supine to prone does not bring pain  Neurological: She is alert and oriented to person, place, and time.  Skin: Skin is warm and dry.  Psychiatric: She has a normal mood and affect. Her behavior is normal.   Results for orders placed in visit on 02/24/14  POCT UA - MICROSCOPIC ONLY      Result Value Ref Range   WBC, Ur, HPF, POC 0-1     RBC, urine, microscopic 0-2     Bacteria, U Microscopic trace     Mucus, UA neg     Epithelial cells, urine per micros 0-2     Crystals, Ur, HPF, POC neg     Casts, Ur, LPF, POC neg     Yeast, UA neg    POCT URINALYSIS DIPSTICK      Result Value Ref Range   Color, UA light yellow     Clarity, UA clear     Glucose, UA neg     Bilirubin, UA neg     Ketones, UA neg     Spec Grav, UA 1.010     Blood, UA small     pH, UA 7.0     Protein, UA neg     Urobilinogen, UA 0.2     Nitrite, UA neg     Leukocytes, UA Negative     [facility] reading (PRIMARY) by  Dr. Katrinka BlazingSmith. KUB:  Moderate stool  burden.      Assessment & Plan:   1. Abdominal pain, left lower quadrant   2. Hematuria   3. Unspecified constipation   4. Esophageal reflux    1. Abdominal pain LLQ:  New to this provider; onset in past two months.  Benign abdominal exam.  No reproducible pain with range of motion of abdominal wall or lumbar spine or L hip.  Normal u/a today.  S/p pelvic ultrasound by gynecology; s/p labs by GI that were normal by report.  Proceed with CT abdomen and pelvis to rule out intra-abdominal mass.  Continue Dexilant for GERD and Miralax for constipation.  RTC for acute worsening. 2.  Hematuria: New with UTI; now resolved.   3.  Constipation: improved in past month with Miralax therapy.   4. GERD: controlled currently with Dexilant.  I personally performed the services described in this documentation, which was scribed in my presence.  The recorded information has been reviewed and is accurate.  Nilda SimmerKristi Aalyah Mansouri, M.D.  [facility]  [place] [address] [place], [place]  [id number] [phone] phone [phone]) [phone] fax

## 2014-03-01 ENCOUNTER — Ambulatory Visit
Admission: RE | Admit: 2014-03-01 | Discharge: 2014-03-01 | Disposition: A | Discharge status: Home / Self Care | Payer: BC Managed Care – PPO | Source: Ambulatory Visit | Attending: Family Medicine | Admitting: Family Medicine

## 2014-03-01 DIAGNOSIS — R319 Hematuria, unspecified: Secondary | ICD-10-CM

## 2014-03-01 DIAGNOSIS — R1032 Left lower quadrant pain: Secondary | ICD-10-CM

## 2014-03-01 MED ORDER — IOHEXOL 300 MG/ML  SOLN
100.0000 mL | Freq: Once | INTRAMUSCULAR | Status: AC | PRN
  Administered 2014-03-01: 100 mL via INTRAVENOUS

## 2014-03-06 ENCOUNTER — Encounter: Payer: Self-pay | Admitting: Family Medicine

## 2014-03-18 ENCOUNTER — Ambulatory Visit (INDEPENDENT_AMBULATORY_CARE_PROVIDER_SITE_OTHER): Payer: BC Managed Care – PPO | Admitting: Internal Medicine

## 2014-03-18 ENCOUNTER — Encounter: Payer: Self-pay | Admitting: Internal Medicine

## 2014-03-18 VITALS — BP 108/56 | HR 58 | Ht 63.0 in | Wt 112.6 lb

## 2014-03-18 DIAGNOSIS — J301 Allergic rhinitis due to pollen: Secondary | ICD-10-CM

## 2014-03-18 DIAGNOSIS — J45909 Unspecified asthma, uncomplicated: Secondary | ICD-10-CM

## 2014-03-18 DIAGNOSIS — J4520 Mild intermittent asthma, uncomplicated: Secondary | ICD-10-CM

## 2014-03-18 NOTE — Patient Instructions (Signed)
We can continue allergy vaccine 1:10 GO  Please call as needed 

## 2014-03-18 NOTE — Progress Notes (Signed)
Patient ID: Morgan Wood, female    DOB: 07-19-1976, 38 y.o.   MRN: [id number]  HPI 03/08/11- [age] yoF  Never smoker, followed for rhinosinusitis and asthmatic bronchitis.  Last here Sep 07, 2010. She got through the winter well. She credits her allergy shots for an unusually good spring, with little stuffiness. Taking only an occasional Z pak  if she plans to be outside.  She uses Qvar in stretches, but complains of cost. Asks about using her rescue inhaler instead.  Recognizes some of her chest tightness is due to anxiety.   09/06/11- 35 yoF  Never smoker, followed for rhinosinusitis and asthmatic bronchitis.  She should cough one head cold earlier in the summer but since then has felt well. 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 She is doing well now with allergy vaccine at 1:10 and she is not interested in making any changes. She does use Neti pot when needed.   03/13/12- 35 yoF  Never smoker, followed for rhinosinusitis and asthmatic bronchitis. Wheezing is worse at night when laying down. Incidentally noted increased wheeze over the last 2 months. Chronic pressure sensation under her lower left anterior costal margin. We discussed anatomy in that area. Her primary physician gave gabapentin "to calm my nerves", and I think she considers herself an anxious person.  09/14/12- 36 yoF  Never smoker, followed for rhinosinusitis and asthmatic bronchitis. Continues allergy vaccine 1:10 GO Recent prednisone for sinusitis. Frequent colds. 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  02/14/2013 acute ov/Wert  Chief Complaint  Patient presents with  . Acute Visit    Pt c/o SOB with or without exertion x 3 days. She is also having dull, achy pain under left breast that radiates to her back. Pain worse with deep inspiration and after eating.    breathing worse x one week  with nasal congestion/ green mucus then acute L pleuritic cp x 3 days "like a dull ache" made worse p eating or with deep breath, no better when lie down. No gagging or vomiting or fever. Not on bcps, no h/o travel, leg symptoms Sleeping ok without nocturnal  or early am exacerbation  of respiratory  c/o's or need for noct saba. Also denies any obvious fluctuation of symptoms with weather or environmental changes or other aggravating or alleviating factors except as outlined above   03/15/13-   37 yoF  Never smoker, followed for rhinosinusitis and asthmatic bronchitis. FOLLOWS FOR: had flare up allergies mid of April-seen by MW. 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 Continues allergy vaccine 1:10 GO. Has done well since her acute visit in April. We again talked about her chronic pressure sensation in the left upper quadrant/left lower anterior costal margin. There has been no change. It does not seem to be a respiratory pattern. CXR 02/14/13  IMPRESSION:  Hyperinflated lungs.  No acute abnormalities.  Original Report Authenticated By: Ulyses SouthwardMark Boles, M.  09/17/13- 37 yoF  Never smoker, followed for rhinosinusitis and asthmatic bronchitis Follows for- 34month rov.  Pt c/o sinus congestion, postnasal drip, mostly dry cough, runny nose. Continues Allergy Vaccine 1:10 GO, doing well. She feels it is worth continuing. Incidental postnasal drip with a recent sore throat. No headache. Still some nasal congestion.  03/18/14- 38 yoF  Never smoker, followed for rhinosinusitis and asthmatic bronchitis FOLLOWS  FOR: still on Allergy vaccine 1:10 GO; has been doing okay overall; had troubles past 2 weeks.  she missed her allergy vaccine 1 week and says "that made a difference" with chest tightness and nasal congestion. Otherwise no wheeze or cough since a cold during the winter. Rare need for rescue inhaler.   ROS-see HPI Constitutional:   No-   weight loss, night sweats, fevers, chills,  fatigue, lassitude. HEENT:   No-  headaches, difficulty swallowing, tooth/dental problems, +sore throat,       No-  sneezing, itching, ear ache, +nasal congestion, +post nasal drip,  CV:  No-   chest pain, orthopnea, PND, swelling in lower extremities, anasarca, dizziness, palpitations Resp: No-   shortness of breath with exertion or at rest.              No-   productive cough,  No non-productive cough,  No- coughing up of blood.              No-   change in color of mucus.  No- wheezing.   Skin: No-   rash or lesions. GI:  No-   heartburn, indigestion, abdominal pain, nausea, vomiting,  GU: . MS:  No-   joint pain or swelling.   Neuro-     nothing unusual Psych:  No- change in mood or affect. No depression or anxiety.  No memory loss.  Objective:   Physical Exam amb wf nad General- Alert, Oriented, Affect-appropriate, Distress- none acute, slender Skin- rash-none, lesions- none, excoriation- none Lymphadenopathy- none Head- atraumatic            Eyes- Gross vision intact, PERRLA, conjunctivae clear secretions            Ears- Hearing, canals-normal, TMs clear            Nose- + minimal turbinate edema, no-Septal dev, polyps, erosion, perforation             Throat- Mallampati II , mucosa clear , drainage- none, tonsils- present Neck- flexible , trachea midline, no stridor , thyroid nl, carotid no bruit Chest - symmetrical excursion , unlabored           Heart/CV- RRR , no murmur , no gallop  , no rub, nl s1 s2                            JVD- trace/ fills from above , edema- none, stasis changes- none, varices- none           Lung- clear to P&A, wheeze- none, cough- none , dullness-none, rub- none           Chest wall-  Abd-  Soft, no tenderness, with particular attention to left upper quadrant  Extrem- cyanosis- none, clubbing, none, atrophy- none, strength- nl

## 2014-04-29 DIAGNOSIS — J4520 Mild intermittent asthma, uncomplicated: Secondary | ICD-10-CM | POA: Insufficient documentation

## 2014-04-29 NOTE — Assessment & Plan Note (Signed)
Generally good control. She believes her vaccine still helps.

## 2014-04-29 NOTE — Assessment & Plan Note (Signed)
Rare need for rescue inhaler. Usually well controlled. Viral infections are the primary trigger

## 2014-05-30 ENCOUNTER — Ambulatory Visit: Payer: BC Managed Care – PPO | Admitting: Gynecology

## 2014-05-31 ENCOUNTER — Ambulatory Visit (INDEPENDENT_AMBULATORY_CARE_PROVIDER_SITE_OTHER): Payer: BC Managed Care – PPO

## 2014-05-31 ENCOUNTER — Encounter: Payer: Self-pay | Admitting: Gynecology

## 2014-05-31 ENCOUNTER — Ambulatory Visit (INDEPENDENT_AMBULATORY_CARE_PROVIDER_SITE_OTHER): Payer: BC Managed Care – PPO | Admitting: Gynecology

## 2014-05-31 VITALS — BP 100/62 | HR 78 | Resp 14 | Ht 64.0 in | Wt 113.0 lb

## 2014-05-31 DIAGNOSIS — Z0000 Encounter for general adult medical examination without abnormal findings: Secondary | ICD-10-CM

## 2014-05-31 DIAGNOSIS — Z01419 Encounter for gynecological examination (general) (routine) without abnormal findings: Secondary | ICD-10-CM

## 2014-05-31 DIAGNOSIS — J309 Allergic rhinitis, unspecified: Secondary | ICD-10-CM

## 2014-05-31 LAB — POCT URINALYSIS DIPSTICK
Leukocytes, UA: NEGATIVE
RBC UA: 2
Urobilinogen, UA: NEGATIVE
pH, UA: 5

## 2014-05-31 LAB — COMPREHENSIVE METABOLIC PANEL
ALT: 14 U/L (ref 0–35)
AST: 18 U/L (ref 0–37)
Albumin: 4.2 g/dL (ref 3.5–5.2)
Alkaline Phosphatase: 39 U/L (ref 39–117)
BUN: 13 mg/dL (ref 6–23)
CALCIUM: 9.5 mg/dL (ref 8.4–10.5)
CHLORIDE: 106 meq/L (ref 96–112)
CO2: 26 meq/L (ref 19–32)
CREATININE: 0.81 mg/dL (ref 0.50–1.10)
GLUCOSE: 87 mg/dL (ref 70–99)
POTASSIUM: 4.3 meq/L (ref 3.5–5.3)
Sodium: 138 mEq/L (ref 135–145)
Total Bilirubin: 0.4 mg/dL (ref 0.2–1.2)
Total Protein: 6.7 g/dL (ref 6.0–8.3)

## 2014-05-31 LAB — LIPID PANEL
CHOL/HDL RATIO: 2.3 ratio
CHOLESTEROL: 126 mg/dL (ref 0–200)
HDL: 54 mg/dL (ref >39)
LDL Cholesterol: 58 mg/dL (ref 0–99)
TRIGLYCERIDES: 70 mg/dL (ref <150)
VLDL: 14 mg/dL (ref 0–40)

## 2014-05-31 LAB — CBC
HEMATOCRIT: 38.5 % (ref 36.0–46.0)
HEMOGLOBIN: 13.1 g/dL (ref 12.0–15.0)
MCH: 29.8 pg (ref 26.0–34.0)
MCHC: 34 g/dL (ref 30.0–36.0)
MCV: 87.5 fL (ref 78.0–100.0)
Platelets: 233 10*3/uL (ref 150–400)
RBC: 4.4 MIL/uL (ref 3.87–5.11)
RDW: 13.6 % (ref 11.5–15.5)
WBC: 5.7 10*3/uL (ref 4.0–10.5)

## 2014-05-31 LAB — HEMOGLOBIN, FINGERSTICK: HEMOGLOBIN, FINGERSTICK: 12.9 g/dL (ref 12.0–16.0)

## 2014-05-31 NOTE — Progress Notes (Signed)
38 y.o. Single Caucasian female   G0P0000 here for annual exam. Pt is not currently sexually active. Pt is feeling better regarding abdominal pain, on miralax twice a day.    Patient's last menstrual period was 05/31/2014.          Sexually active: No.  The current method of family planning is abstinence.    Exercising: yes  softball 4-5x/wk Last pap: 05/23/12 ASCUS HR HPV Neg Alcohol: No Tobacco: No BSE: yes  Hgb: 12.9 ; Urine: Blood 2     Health Maintenance  Topic Date Due  . Influenza Vaccine  06/01/2014  . Pap Smear  05/24/2015  . Tetanus/tdap  02/18/2018    Family History  Problem Relation Age of Onset  . Diabetes Other   . Heart disease Other   . Cancer Maternal Grandfather     skin cancer  . Heart attack Maternal Grandfather   . Emphysema Maternal Grandfather   . Parkinsonism Paternal Grandmother   . Heart attack Paternal Grandfather   . Hypertension Father   . Cancer Maternal Grandmother     Patient Active Problem List   Diagnosis Date Noted  . Asthma, mild intermittent, well-controlled 04/29/2014  . Pain in right shin 05/03/2013  . Peroneal ganglion cyst 05/03/2013  . Chest pain 02/14/2013  . Allergic rhinitis due to pollen 03/14/2011  . RHINOSINUSITIS, ACUTE 11/04/2008  . ASTHMATIC BRONCHITIS, ACUTE 11/30/2007    Past Medical History  Diagnosis Date  . Allergic rhinitis   . Asthma   . Headache(784.0)   . Environmental allergies     rash,swelling in throat,itching  . Bronchitis     1-2 episodes yearly  . Laryngitis     2-3episodes yearly    Past Surgical History  Procedure Laterality Date  . Nasal septum surgery  11/01/2002  . Wisdom tooth extraction    . Eus  10/13/2012    Procedure: UPPER ENDOSCOPIC ULTRASOUND (EUS) LINEAR;  Surgeon: Theda Belfast, MD;  Location: [facility];  Service: Endoscopy;  Laterality: N/A;  . Bulging disc c3 c4  2006  . Upper gastrointestinal endoscopy  11/13; 12/13    Allergies: Albuterol and  Azithromycin  Current Outpatient Prescriptions  Medication Sig Dispense Refill  . Cetirizine HCl (ZYRTEC PO) Take by mouth as needed.       Marland Kitchen dexlansoprazole (DEXILANT) 60 MG capsule Take 60 mg by mouth daily.      Marland Kitchen EPINEPHrine (EPI-PEN) 0.3 mg/0.3 mL SOAJ injection For severe allergic reaction  1 Device  prn  . levalbuterol (XOPENEX HFA) 45 MCG/ACT inhaler Inhale 2 puffs into the lungs every 6 (six) hours as needed for wheezing. prn  1 Inhaler  12  . Polyethylene Glycol 3350 (MIRALAX PO) Take by mouth as needed.       Marland Kitchen UNABLE TO FIND once a week. Med Name: Allergy Shots       No current facility-administered medications for this visit.    ROS: Pertinent items are noted in HPI.  Exam:    BP 100/62  Pulse 78  Resp 14  Ht 5\' 4"  (1.626 m)  Wt 113 lb (51.256 kg)  BMI 19.39 kg/m2  LMP 05/31/2014 Weight change: @WEIGHTCHANGE @ Last 3 height recordings:  Ht Readings from Last 3 Encounters:  05/31/14 5\' 4"  (1.626 m)  03/18/14 5\' 3"  (1.6 m)  02/24/14 5\' 3"  (1.6 m)   General appearance: alert, cooperative and appears stated age Head: Normocephalic, without obvious abnormality, atraumatic Neck: no adenopathy, no carotid bruit, no JVD, supple,  symmetrical, trachea midline and thyroid not enlarged, symmetric, no tenderness/mass/nodules Lungs: clear to auscultation bilaterally Breasts: normal appearance, no masses or tenderness Heart: regular rate and rhythm, S1, S2 normal, no murmur, click, rub or gallop Abdomen: soft, non-tender; bowel sounds normal; no masses,  no organomegaly Extremities: extremities normal, atraumatic, no cyanosis or edema Skin: Skin color, texture, turgor normal. No rashes or lesions Lymph nodes: Cervical, supraclavicular, and axillary nodes normal. no inguinal nodes palpated Neurologic: Grossly normal   Pelvic: External genitalia:  no lesions              Urethra: normal appearing urethra with no masses, tenderness or lesions              Bartholins and  Skenes: Bartholin's, Urethra, Skene's normal                 Vagina: normal appearing vagina with normal color and discharge, no lesions              Cervix: normal appearance              Pap taken: No.        Bimanual Exam:  Uterus:  uterus is normal size, shape, consistency and nontender                                      Adnexa:    normal adnexa in size, nontender and no masses                                      Rectovaginal: Confirms                                      Anus:  normal sphincter tone, no lesions       1. Routine gynecological examination counseled on breast self exam, mammography screening, adequate intake of calcium and vitamin D, diet and exercise return annually or prn  2. Laboratory examination ordered as part of a routine general medical examination  - POCT Urinalysis Dipstick - Hemoglobin, fingerstick - CBC - Comprehensive metabolic panel - Lipid panel  An After Visit Summary was printed and given to the patient.

## 2014-08-26 ENCOUNTER — Telehealth: Payer: Self-pay | Admitting: Gynecology

## 2014-08-26 NOTE — Telephone Encounter (Signed)
Patient notfied last BP  BP Readings from Last 3 Encounters:  05/31/14 100/62  03/18/14 108/56  02/24/14 122/70    Verbalized understanding. Will call back prn.

## 2014-08-26 NOTE — Telephone Encounter (Signed)
Patient is asking for her last BP.

## 2014-09-06 ENCOUNTER — Encounter: Payer: Self-pay | Admitting: Internal Medicine

## 2014-09-19 ENCOUNTER — Telehealth: Payer: Self-pay | Admitting: Internal Medicine

## 2014-09-19 MED ORDER — AMOXICILLIN-POT CLAVULANATE 500-125 MG PO TABS: 1.0000 | ORAL_TABLET | Freq: Two times a day (BID) | ORAL | Status: DC

## 2014-09-19 NOTE — Telephone Encounter (Signed)
She can come for flu shot when she is sure she is getting better and has no fever

## 2014-09-19 NOTE — Telephone Encounter (Signed)
[id number] x1 

## 2014-09-19 NOTE — Telephone Encounter (Signed)
Pt c/o sinus pressure on left side of head, HA and ear ache x 2 days Denies discoloration in mucus and no cough. States that she has been using NyQuil x 2 days  Please advise Dr Maple HudsonYoung. Thanks.  Allergies  Allergen Reactions  . Albuterol Other (See Comments)    High Heart Rate Jitters  . Azithromycin Other (See Comments)    Stomach cramps   Current Outpatient Prescriptions on File Prior to Visit  Medication Sig Dispense Refill  . Cetirizine HCl (ZYRTEC PO) Take by mouth as needed.     Marland Kitchen. EPINEPHrine (EPI-PEN) 0.3 mg/0.3 mL SOAJ injection For severe allergic reaction 1 Device prn  . levalbuterol (XOPENEX HFA) 45 MCG/ACT inhaler Inhale 2 puffs into the lungs every 6 (six) hours as needed for wheezing. prn 1 Inhaler 12  . omeprazole (PRILOSEC) 10 MG capsule Take 10 mg by mouth daily.    . Polyethylene Glycol 3350 (MIRALAX PO) Take by mouth as needed.     Marland Kitchen. UNABLE TO FIND once a week. Med Name: Allergy Shots     No current facility-administered medications on file prior to visit.

## 2014-09-19 NOTE — Telephone Encounter (Signed)
Suggest augmentin 500 mg, # 14, 1 twice daily 

## 2014-09-19 NOTE — Telephone Encounter (Signed)
Spoke with pt.  Discussed below recs per CY.  She verbalized understanding and is aware abx rx sent to [facility] [address]  Pt is to call back if symptoms do not improve or worsen.  Dr. Maple HudsonYoung, pt would like to come in for flu shot.  How long should she wait to do so?  Please advise.  Thank you.

## 2014-09-19 NOTE — Telephone Encounter (Signed)
[phone] returning call

## 2014-09-19 NOTE — Telephone Encounter (Signed)
I called and spoke with the pt and notified of recs per CDY  She verbalized understanding  Nothing further needed

## 2014-10-22 IMAGING — CR DG CHEST 2V
2 series · 2 of 2 positions shown · non-contrast
Comparison: 05/26/2010

CLINICAL DATA: Left chest pain, cough, shortness of breath for 1
week, asthma

CHEST - 2 VIEW

[view not recorded (1 of 2)]
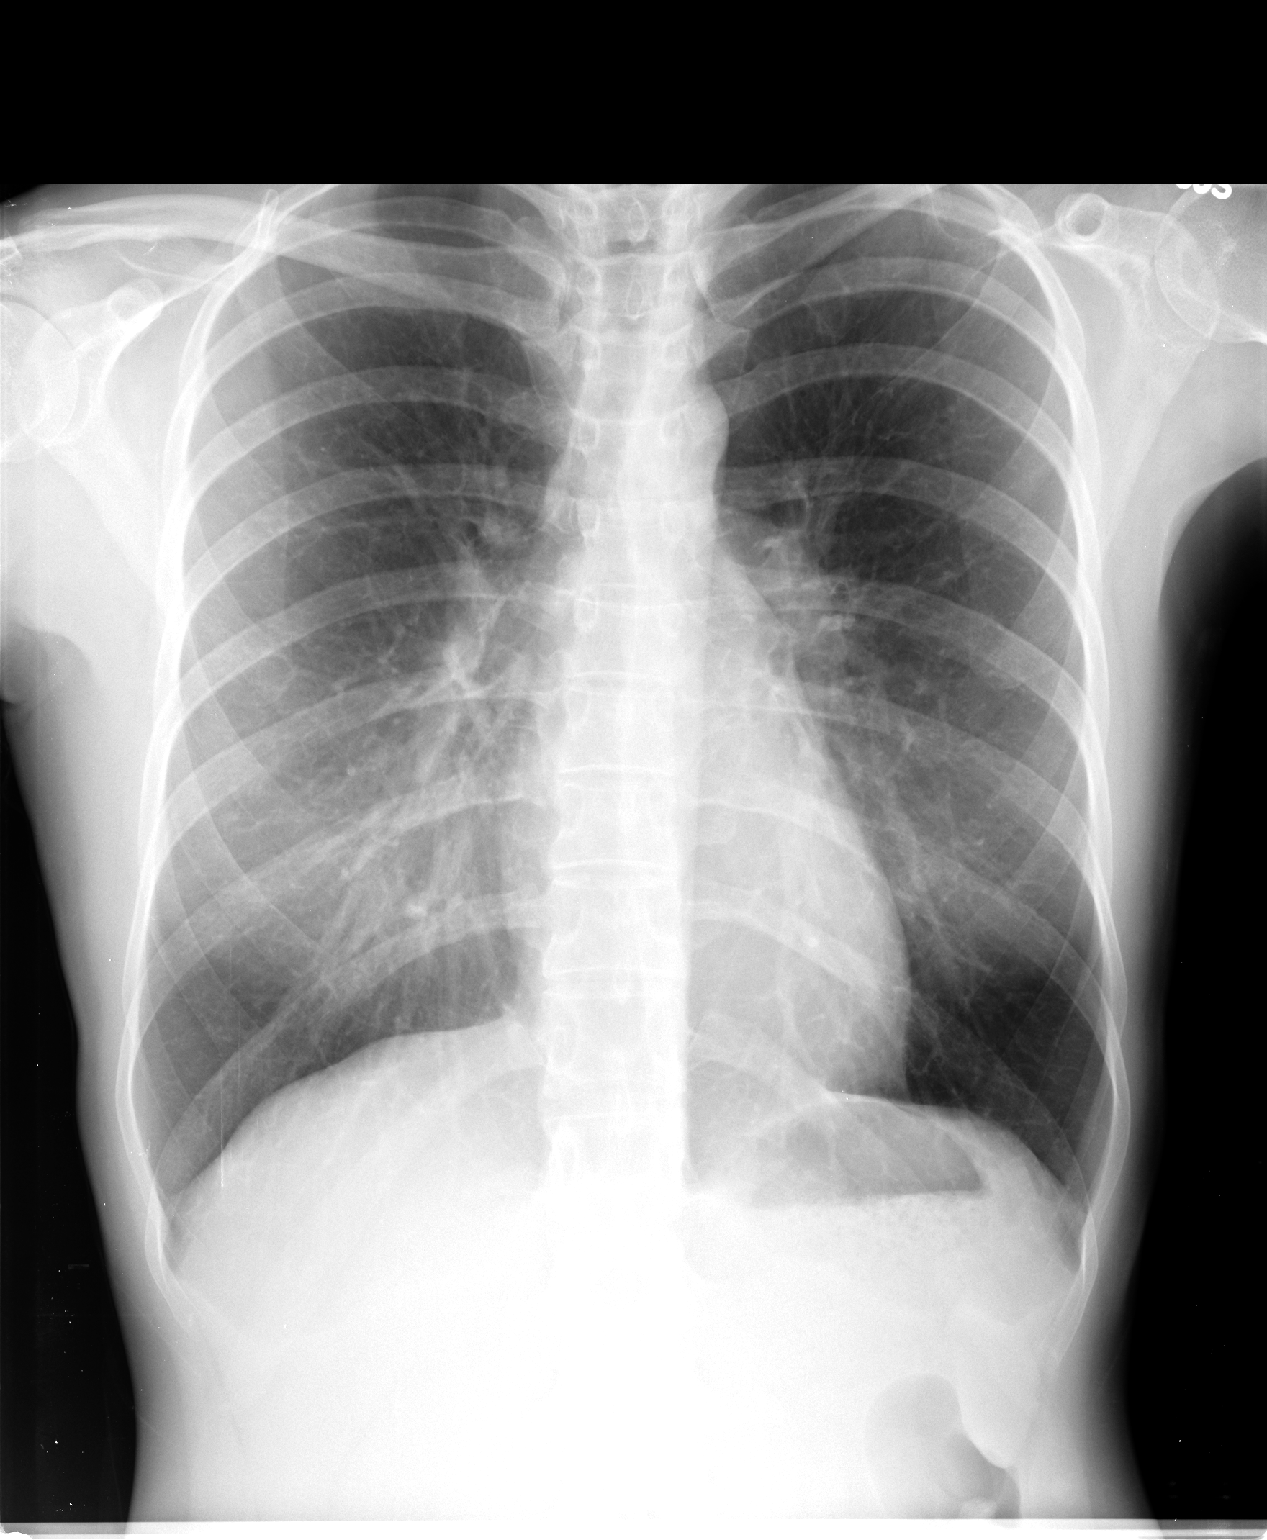

[view not recorded (2 of 2)]
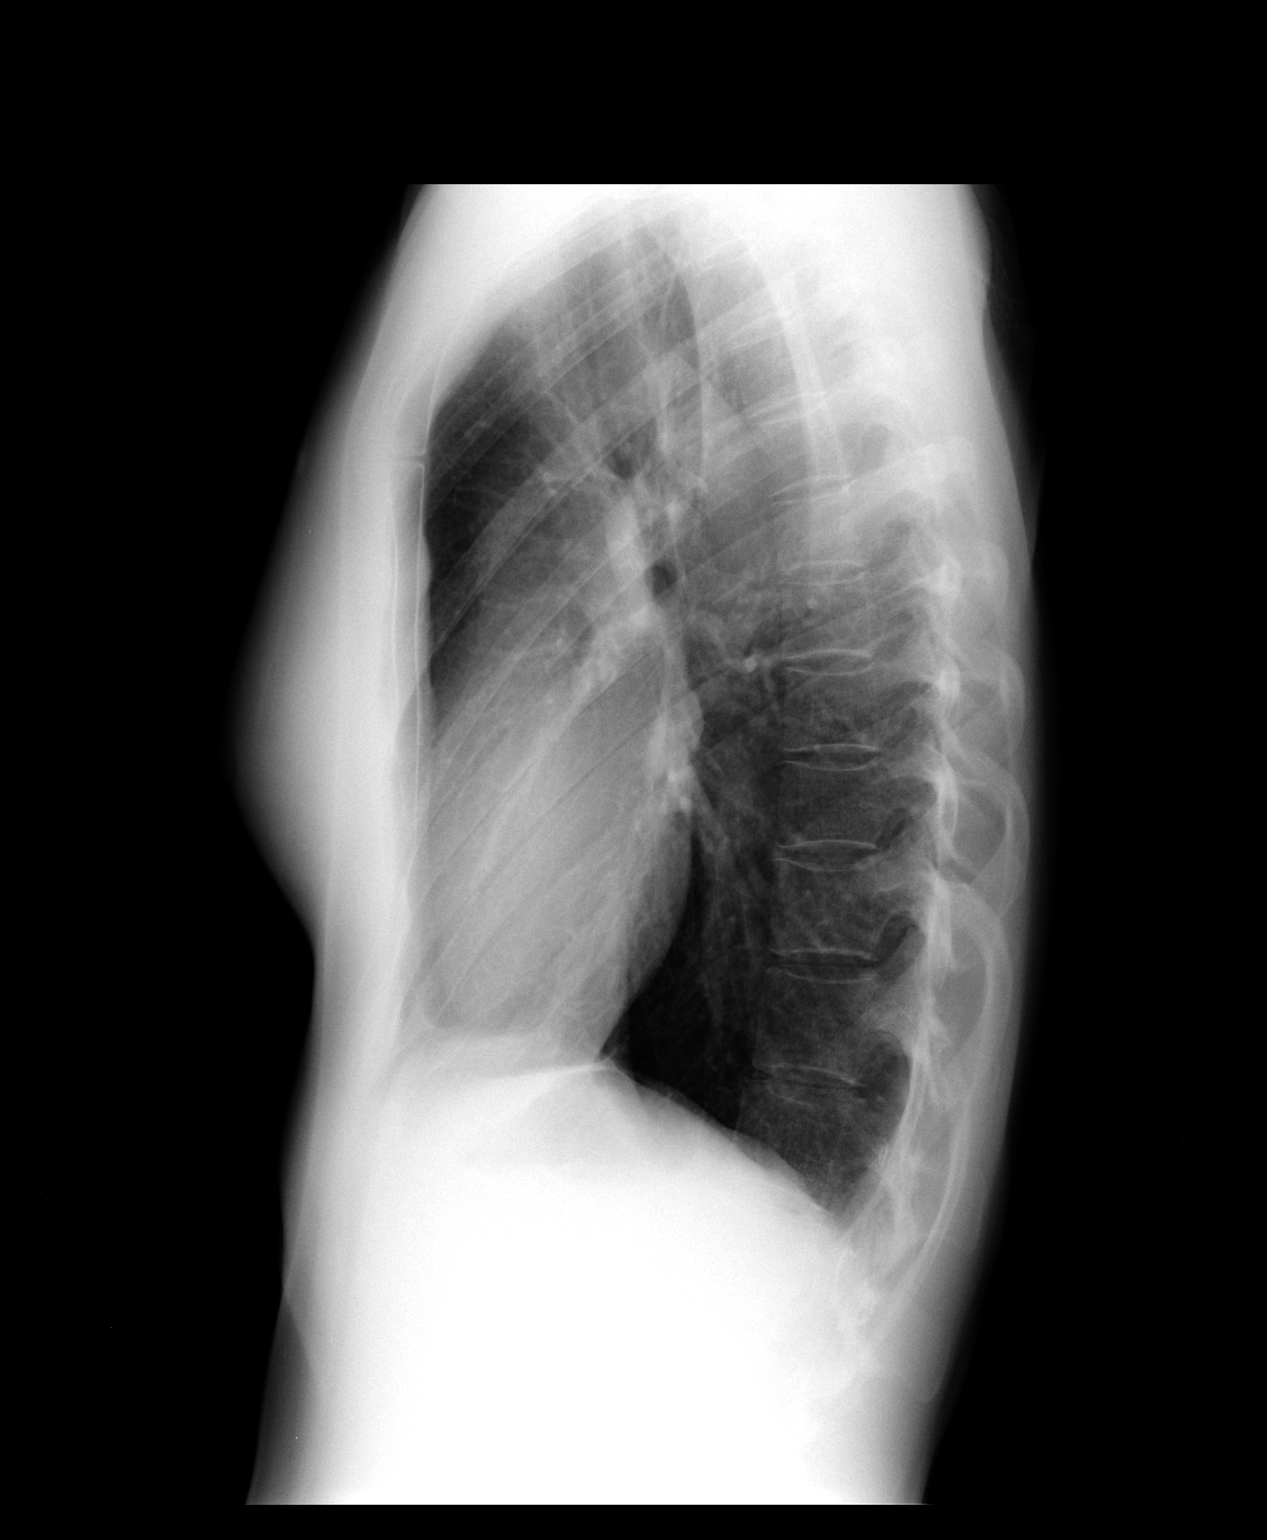

[2 of 2 positions shown; findings below may reference images not displayed]

FINDINGS: Normal heart size, mediastinal contours, and pulmonary vascularity.
Mild hyperaeration without infiltrate, pleural effusion or
pneumothorax.
Bones unremarkable.
IMPRESSION: Hyperinflated lungs.
No acute abnormalities.

## 2014-11-27 ENCOUNTER — Ambulatory Visit (INDEPENDENT_AMBULATORY_CARE_PROVIDER_SITE_OTHER): Payer: BC Managed Care – PPO

## 2014-11-27 DIAGNOSIS — J309 Allergic rhinitis, unspecified: Secondary | ICD-10-CM

## 2015-01-05 ENCOUNTER — Other Ambulatory Visit: Payer: Self-pay | Admitting: Internal Medicine

## 2015-02-09 ENCOUNTER — Ambulatory Visit (INDEPENDENT_AMBULATORY_CARE_PROVIDER_SITE_OTHER): Payer: BC Managed Care – PPO | Admitting: Family Medicine

## 2015-02-09 VITALS — BP 106/74 | HR 81 | Temp 98.1°F | Resp 16 | Ht 63.5 in | Wt 109.4 lb

## 2015-02-09 DIAGNOSIS — M67431 Ganglion, right wrist: Secondary | ICD-10-CM | POA: Diagnosis not present

## 2015-02-09 MED ORDER — DICLOFENAC SODIUM 75 MG PO TBEC: 75.0000 mg | DELAYED_RELEASE_TABLET | Freq: Two times a day (BID) | ORAL | Status: DC

## 2015-02-09 NOTE — Progress Notes (Signed)
° °  Subjective:  This chart was scribed for Elvina SidleKurt Lauenstein, MD, by Elon SpannerGarrett Cook, Medical Scribe. This patient was seen in room Rm 9 and the patient's care was started at 2:52 PM.    Patient ID: Morgan DevonJamie M Strawser, female    DOB: November 20, 1975, 39 y.o.   MRN: [id number]  HPI HPI Comments: Morgan Wood is a 39 y.o. female who [facility] of right hand and right forearm pain onset 3 weeks ago without injury.  Patient reports that 3 weeks ago she played her first softball game and several days after she began to feel an ache in her current complaint with radiation to her right shoulder.  She reports the radiation has resolved but she now has a bump on the right dorsal surface of the hand.  Patient has taken ibuprofen without relief.  Patient reports a previous history of ganglion cysts in her right foot.  Patient is right-handed.    Patient is a Engineer, siteschool teacher.    Review of Systems  Musculoskeletal: Positive for myalgias and arthralgias.       Objective:   Physical Exam  Constitutional: She is oriented to person, place, and time. She appears well-developed and well-nourished. No distress.  HENT:  Head: Normocephalic and atraumatic.  Eyes: Conjunctivae and EOM are normal.  Neck: Neck supple. No tracheal deviation present.  Cardiovascular: Normal rate.   Pulmonary/Chest: Effort normal. No respiratory distress.  Musculoskeletal: Normal range of motion.  Neurological: She is alert and oriented to person, place, and time.  Skin: Skin is warm and dry.  Psychiatric: She has a normal mood and affect. Her behavior is normal.  Nursing note and vitals reviewed.  Right dorsal wrist is mildly swollen in the midline at the joint with some tenderness but full range of motion. There is no overlying skin changes, crepitus, or ecchymosis.    Assessment & Plan:  2:59 PM Discussed suspicion of ganglion cyst.  Will prescribe pain medication and referral to orthopaedics.  Patient acknowledges  and agrees with plan.     This has appearance of a right dorsal ganglion cyst because of swelling and tenderness in that area. I'm going to be referring her but in the meantime will give her some any inflammatories and a right wrist splint  This chart was scribed in my presence and reviewed by me personally.    ICD-9-CM ICD-10-CM   1. Ganglion cyst of wrist, right 727.41 M67.431 diclofenac (VOLTAREN) 75 MG EC tablet     Ambulatory referral to Hand Surgery     Signed, Elvina SidleKurt Lauenstein, MD

## 2015-02-09 NOTE — Patient Instructions (Signed)

## 2015-03-17 ENCOUNTER — Encounter: Payer: Self-pay | Admitting: Internal Medicine

## 2015-03-17 ENCOUNTER — Ambulatory Visit (INDEPENDENT_AMBULATORY_CARE_PROVIDER_SITE_OTHER): Payer: BC Managed Care – PPO | Admitting: Internal Medicine

## 2015-03-17 VITALS — BP 98/60 | HR 76 | Ht 63.0 in | Wt 112.0 lb

## 2015-03-17 DIAGNOSIS — J4520 Mild intermittent asthma, uncomplicated: Secondary | ICD-10-CM | POA: Diagnosis not present

## 2015-03-17 DIAGNOSIS — J301 Allergic rhinitis due to pollen: Secondary | ICD-10-CM | POA: Diagnosis not present

## 2015-03-17 MED ORDER — EPINEPHRINE 0.3 MG/0.3ML IJ SOAJ: INTRAMUSCULAR | Status: DC

## 2015-03-17 MED ORDER — LEVALBUTEROL TARTRATE 45 MCG/ACT IN AERO: 2.0000 | INHALATION_SPRAY | Freq: Four times a day (QID) | RESPIRATORY_TRACT | Status: DC | PRN

## 2015-03-17 MED ORDER — VALACYCLOVIR HCL 1 G PO TABS: ORAL_TABLET | ORAL | Status: DC

## 2015-03-17 NOTE — Patient Instructions (Signed)
Flu vax if available  We can continue allergy vaccine 1:10 GH  Scripts refilling Epipen, Valacyclovir and Xopenex HFA sent

## 2015-03-17 NOTE — Progress Notes (Signed)
Patient ID: Andi DevonJamie M Soliz, female    DOB: [birth year], 39 y.o.   MRN: [id number]  HPI 03/08/11- [age] yoF  Never smoker, followed for rhinosinusitis and asthmatic bronchitis.  Last here Sep 07, 2010. She got through the winter well. She credits her allergy shots for an unusually good spring, with little stuffiness. Taking only an occasional Z pak  if she plans to be outside.  She uses Qvar in stretches, but complains of cost. Asks about using her rescue inhaler instead.  Recognizes some of her chest tightness is due to anxiety.   09/06/11- 35 yoF  Never smoker, followed for rhinosinusitis and asthmatic bronchitis.  She should cough one head cold earlier in the summer but since then has felt well. 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 She is doing well now with allergy vaccine at 1:10 and she is not interested in making any changes. She does use Neti pot when needed.   03/13/12- 35 yoF  Never smoker, followed for rhinosinusitis and asthmatic bronchitis. Wheezing is worse at night when laying down. Incidentally noted increased wheeze over the last 2 months. Chronic pressure sensation under her lower left anterior costal margin. We discussed anatomy in that area. Her primary physician gave gabapentin "to calm my nerves", and I think she considers herself an anxious person.  09/14/12- 36 yoF  Never smoker, followed for rhinosinusitis and asthmatic bronchitis. Continues allergy vaccine 1:10 GO Recent prednisone for sinusitis. Frequent colds. 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  02/14/2013 acute ov/Wert  Chief Complaint  Patient presents with  . Acute Visit    Pt c/o SOB with or without exertion x 3 days. She is also having dull, achy pain under left breast that radiates to her back. Pain worse with deep inspiration and after eating.    breathing worse x one week  with nasal congestion/ green mucus then acute L pleuritic cp x 3 days "like a dull ache" made worse p eating or with deep breath, no better when lie down. No gagging or vomiting or fever. Not on bcps, no h/o travel, leg symptoms Sleeping ok without nocturnal  or early am exacerbation  of respiratory  c/o's or need for noct saba. Also denies any obvious fluctuation of symptoms with weather or environmental changes or other aggravating or alleviating factors except as outlined above   03/15/13-   37 yoF  Never smoker, followed for rhinosinusitis and asthmatic bronchitis. FOLLOWS FOR: had flare up allergies mid of April-seen by MW. 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 Continues allergy vaccine 1:10 GO. Has done well since her acute visit in April. We again talked about her chronic pressure sensation in the left upper quadrant/left lower anterior costal margin. There has been no change. It does not seem to be a respiratory pattern. CXR 02/14/13  IMPRESSION:  Hyperinflated lungs.  No acute abnormalities.  Original Report Authenticated By: Ulyses SouthwardMark Boles, M.  09/17/13- 37 yoF  Never smoker, followed for rhinosinusitis and asthmatic bronchitis Follows for- 23month rov.  Pt c/o sinus congestion, postnasal drip, mostly dry cough, runny nose. Continues Allergy Vaccine 1:10 GO, doing well. She feels it is worth continuing. Incidental postnasal drip with a recent sore throat. No headache. Still some nasal congestion.  03/18/14- 38 yoF  Never smoker, followed for rhinosinusitis and asthmatic bronchitis FOLLOWS  FOR: still on Allergy vaccine 1:10 GO; has been doing okay overall; had troubles past 2 weeks.  she missed her allergy vaccine 1 week and says "that made a difference" with chest tightness and nasal congestion. Otherwise no wheeze or cough since a cold during the winter. Rare need for rescue inhaler.  03/17/15- 39 yoF  Never smoker, followed for rhinosinusitis and asthmatic  bronchitis FOLLOWS FOR still on Allergy vaccine 1:10 GO. 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 Discussed. Infrequent need for Xopenex rescue inhaler but it is much better tolerated than albuterol.   ROS-see HPI Constitutional:   No-   weight loss, night sweats, fevers, chills, fatigue, lassitude. HEENT:   No-  headaches, difficulty swallowing, tooth/dental problems, +sore throat,       No-  sneezing, itching, ear ache, +nasal congestion, +post nasal drip,  CV:  No-   chest pain, orthopnea, PND, swelling in lower extremities, anasarca, dizziness, palpitations Resp: No-   shortness of breath with exertion or at rest.              No-   productive cough,  No non-productive cough,  No- coughing up of blood.              No-   change in color of mucus.  No- wheezing.   Skin: No-   rash or lesions. GI:  No-   heartburn, indigestion, abdominal pain, nausea, vomiting,  GU: . MS:  No-   joint pain or swelling.   Neuro-     nothing unusual Psych:  No- change in mood or affect. No depression or anxiety.  No memory loss.  Objective:   Physical Exam amb wf nad General- Alert, Oriented, Affect-appropriate, Distress- none acute, slender Skin- rash-none, lesions- none, excoriation- none Lymphadenopathy- none Head- atraumatic            Eyes- Gross vision intact, PERRLA, conjunctivae clear secretions            Ears- Hearing, canals-normal, TMs clear            Nose- + minimal turbinate edema, no-Septal dev, polyps, erosion, perforation             Throat- Mallampati II , mucosa clear , drainage- none, tonsils- present Neck- flexible , trachea midline, no stridor , thyroid nl, carotid no bruit Chest - symmetrical excursion , unlabored           Heart/CV- RRR , no murmur , no gallop  , no rub, nl s1 s2                            JVD- trace/ fills from above , edema- none, stasis changes- none, varices- none           Lung- clear to P&A,  wheeze- none, cough- none , dullness-none, rub- none           Chest wall-  Abd-  Soft, no tenderness, with particular attention to left upper quadrant  Extrem- cyanosis- none, clubbing, none, atrophy- none, strength- nl

## 2015-04-06 NOTE — Assessment & Plan Note (Signed)
Okay to continue allergy vaccine which she still finds helpful

## 2015-04-06 NOTE — Assessment & Plan Note (Signed)
Xopenex HFA is much better tolerated than albuterol. No change required. Plan-may have flu vaccine 2015-2016 winter season if we still have any available.

## 2015-05-28 ENCOUNTER — Telehealth: Payer: Self-pay | Admitting: Internal Medicine

## 2015-05-30 MED ORDER — "TUBERCULIN-ALLERGY SYRINGES 25G X 5/8"" 1 ML MISC": Status: DC

## 2015-05-30 NOTE — Telephone Encounter (Signed)
Tammy, please advise. Thanks 

## 2015-05-30 NOTE — Telephone Encounter (Signed)
Morgan Wood mailed Callahan Brindisi's vac. Today. Will you order her needles: [facility] [address] [phone].   Thanks, Mikeila Burgen S.

## 2015-05-30 NOTE — Telephone Encounter (Signed)
Needle Rx been sent. Morgan Wood please advise if you have spoke with patient letting her know that her needles and vaccine have been taken care of. Thanks.

## 2015-06-02 ENCOUNTER — Ambulatory Visit: Payer: BC Managed Care – PPO | Admitting: Gynecology

## 2015-06-02 NOTE — Telephone Encounter (Signed)
Yes

## 2015-06-03 ENCOUNTER — Telehealth: Payer: Self-pay | Admitting: Internal Medicine

## 2015-06-03 ENCOUNTER — Ambulatory Visit (INDEPENDENT_AMBULATORY_CARE_PROVIDER_SITE_OTHER): Payer: BC Managed Care – PPO

## 2015-06-03 DIAGNOSIS — J309 Allergic rhinitis, unspecified: Secondary | ICD-10-CM | POA: Diagnosis not present

## 2015-06-03 NOTE — Telephone Encounter (Signed)
Date Mixed: 06/03/15 Vial: 2  Strength: 1:10 Here/Mail/Pick Up: mail Mixed By: tbs 

## 2015-06-16 ENCOUNTER — Ambulatory Visit (INDEPENDENT_AMBULATORY_CARE_PROVIDER_SITE_OTHER): Payer: BC Managed Care – PPO | Admitting: Obstetrics & Gynecology

## 2015-06-16 ENCOUNTER — Encounter: Payer: Self-pay | Admitting: Obstetrics & Gynecology

## 2015-06-16 VITALS — BP 104/60 | HR 80 | Resp 15 | Ht 63.0 in | Wt 115.0 lb

## 2015-06-16 DIAGNOSIS — Z124 Encounter for screening for malignant neoplasm of cervix: Secondary | ICD-10-CM | POA: Diagnosis not present

## 2015-06-16 DIAGNOSIS — Z0000 Encounter for general adult medical examination without abnormal findings: Secondary | ICD-10-CM

## 2015-06-16 DIAGNOSIS — K5901 Slow transit constipation: Secondary | ICD-10-CM

## 2015-06-16 DIAGNOSIS — Z01419 Encounter for gynecological examination (general) (routine) without abnormal findings: Secondary | ICD-10-CM | POA: Diagnosis not present

## 2015-06-16 DIAGNOSIS — K5900 Constipation, unspecified: Secondary | ICD-10-CM | POA: Insufficient documentation

## 2015-06-16 LAB — POCT URINALYSIS DIPSTICK
Bilirubin, UA: NEGATIVE
Blood, UA: NEGATIVE
GLUCOSE UA: NEGATIVE
Ketones, UA: NEGATIVE
LEUKOCYTES UA: NEGATIVE
NITRITE UA: NEGATIVE
PROTEIN UA: NEGATIVE
Spec Grav, UA: 1.015
UROBILINOGEN UA: NEGATIVE
pH, UA: 6

## 2015-06-16 LAB — COMPREHENSIVE METABOLIC PANEL
ALT: 17 U/L (ref 6–29)
AST: 19 U/L (ref 10–30)
Albumin: 4.4 g/dL (ref 3.6–5.1)
Alkaline Phosphatase: 39 U/L (ref 33–115)
BILIRUBIN TOTAL: 0.6 mg/dL (ref 0.2–1.2)
BUN: 13 mg/dL (ref 7–25)
CALCIUM: 9.8 mg/dL (ref 8.6–10.2)
CO2: 23 mmol/L (ref 20–31)
Chloride: 105 mmol/L (ref 98–110)
Creat: 0.85 mg/dL (ref 0.50–1.10)
GLUCOSE: 88 mg/dL (ref 65–99)
Potassium: 4.5 mmol/L (ref 3.5–5.3)
Sodium: 138 mmol/L (ref 135–146)
Total Protein: 7.3 g/dL (ref 6.1–8.1)

## 2015-06-16 LAB — HEMOGLOBIN, FINGERSTICK: HEMOGLOBIN, FINGERSTICK: 14.8 g/dL (ref 12.0–16.0)

## 2015-06-16 LAB — LIPID PANEL
CHOL/HDL RATIO: 2.7 ratio (ref <=5.0)
Cholesterol: 154 mg/dL (ref 125–200)
HDL: 58 mg/dL (ref >=46)
LDL CALC: 81 mg/dL (ref <130)
Triglycerides: 77 mg/dL (ref <150)
VLDL: 15 mg/dL (ref <30)

## 2015-06-16 MED ORDER — VALACYCLOVIR HCL 1 G PO TABS: ORAL_TABLET | ORAL | Status: DC

## 2015-06-16 NOTE — Progress Notes (Signed)
Patient ID: Morgan Wood, female   DOB: 1976/07/10, 39 y.o.   MRN: [id number]   39 y.o. G0P0000 SingleCaucasianF here for annual exam.  Doing well.  Going to [employer] on Wednesday.  Cycles are regular.    Pt feels like she is having more bruising than over the past few years.  Pt having some increased cramping in her right calf.    Pt reports having some increased issues with RUQ pain.  Pt reports similar issues last year and had a CT 03/01/14 showing significant constipation.    Patient's last menstrual period was 06/01/2015.          Sexually active: No.  The current method of family planning is abstinence.    Exercising: Yes.    playing soft ball Smoker:  no  Health Maintenance: Pap:  05-23-12 ASCUS NEG HR HPV History of abnormal Pap:  No  MMG:  03-07-13 WNL Colonoscopy:  N/A BMD:   N/A TDaP:  02-19-08  Screening Labs: labs drawn today, Hb today: 14.1, Urine today: WNL    reports that she has never smoked. She has never used smokeless tobacco. She reports that she does not drink alcohol or use illicit drugs.  Past Medical History  Diagnosis Date  . Allergic rhinitis   . Asthma   . Headache(784.0)   . Environmental allergies     rash,swelling in throat,itching  . Bronchitis     1-2 episodes yearly  . Laryngitis     2-3episodes yearly    Past Surgical History  Procedure Laterality Date  . Nasal septum surgery  11/01/2002  . Wisdom tooth extraction    . Eus  10/13/2012    Procedure: UPPER ENDOSCOPIC ULTRASOUND (EUS) LINEAR;  Surgeon: Theda Belfast, MD;  Location: [facility];  Service: Endoscopy;  Laterality: N/A;  . Bulging disc c3 c4  2006  . Upper gastrointestinal endoscopy  11/13; 12/13    Current Outpatient Prescriptions  Medication Sig Dispense Refill  . Cetirizine HCl (ZYRTEC PO) Take by mouth as needed.     Marland Kitchen EPINEPHrine 0.3 mg/0.3 mL IJ SOAJ injection For severe allergic reaction 1 Device prn  . levalbuterol (XOPENEX HFA) 45 MCG/ACT inhaler Inhale 2 puffs into  the lungs every 6 (six) hours as needed for wheezing. prn 1 Inhaler 12  . Polyethylene Glycol 3350 (MIRALAX PO) Take by mouth as needed.     . Tuberculin-Allergy Syringes 25G X 5/8" 1 ML MISC Use as directed to inject allergy vaccine 100 each 0  . UNABLE TO FIND once a week. Med Name: Allergy Shots    . valACYclovir (VALTREX) 1000 MG tablet 2 at onset of outbreak, then one every 12 hours while needed 50 tablet 11   No current facility-administered medications for this visit.    Family History  Problem Relation Age of Onset  . Diabetes Other   . Heart disease Other   . Cancer Maternal Grandfather     skin cancer  . Heart attack Maternal Grandfather   . Emphysema Maternal Grandfather   . Parkinsonism Paternal Grandmother   . Heart attack Paternal Grandfather   . Hypertension Father   . Cancer Maternal Grandmother     ROS:  Pertinent items are noted in HPI.  Otherwise, a comprehensive ROS was negative.  Exam:   BP 104/60 mmHg  Pulse 80  Resp 15  Ht  (1.6 m)  Wt 115 lb (52.164 kg)  BMI 20.38 kg/m2  LMP 06/01/2015  Weight change: +3#  Height:   Height: 5\' 3"  (160 cm)  Ht Readings from Last 3 Encounters:  06/16/15 5\' 3"  (1.6 m)  03/17/15 5\' 3"  (1.6 m)  02/09/15 5' 3.5" (1.613 m)    General appearance: alert, cooperative and appears stated age Head: Normocephalic, without obvious abnormality, atraumatic Neck: no adenopathy, supple, symmetrical, trachea midline and thyroid normal to inspection and palpation Lungs: clear to auscultation bilaterally Breasts: normal appearance, no masses or tenderness Heart: regular rate and rhythm Abdomen: soft, non-tender; bowel sounds normal; no masses,  no organomegaly Extremities: extremities normal, atraumatic, no cyanosis or edema Skin: Skin color, texture, turgor normal. No rashes or lesions Lymph nodes: Cervical, supraclavicular, and axillary nodes normal. No abnormal inguinal nodes palpated Neurologic: Grossly normal   Pelvic:  External genitalia:  no lesions              Urethra:  normal appearing urethra with no masses, tenderness or lesions              Bartholins and Skenes: normal                 Vagina: normal appearing vagina with normal color and discharge, no lesions              Cervix: no lesions              Pap taken: Yes.   Bimanual Exam:  Uterus:  normal size, contour, position, consistency, mobility, non-tender              Adnexa: normal adnexa and no mass, fullness, tenderness               Rectovaginal: Confirms               Anus:  normal sphincter tone, no lesions  Chaperone was present for exam.  A:  Well Woman with normal exam Not sexually active Grade 4 breast density HSV 1  P:   Mammogram recommendations discussed.  Pt will start this year. pap smear today.  ASCUS with neg HR HPV 2013.  CMP, Lipids, TSH rx for Valtrex and instructions provided.  #30/3RF. return annually or prn

## 2015-06-16 NOTE — Patient Instructions (Signed)
Colace  twice daily  Valcyclovir.   tablet--take two tabs for two doses, 12 hours apart.

## 2015-06-17 LAB — TSH: TSH: 2.354 u[IU]/mL (ref 0.350–4.500)

## 2015-06-19 LAB — IPS PAP TEST WITH REFLEX TO HPV

## 2015-06-23 ENCOUNTER — Other Ambulatory Visit: Payer: Self-pay

## 2015-06-23 DIAGNOSIS — Z1231 Encounter for screening mammogram for malignant neoplasm of breast: Secondary | ICD-10-CM

## 2015-06-24 ENCOUNTER — Encounter: Payer: Self-pay | Admitting: Internal Medicine

## 2015-06-27 ENCOUNTER — Ambulatory Visit
Admission: RE | Admit: 2015-06-27 | Discharge: 2015-06-27 | Disposition: A | Discharge status: Home / Self Care | Payer: BC Managed Care – PPO | Source: Ambulatory Visit

## 2015-06-27 DIAGNOSIS — Z1231 Encounter for screening mammogram for malignant neoplasm of breast: Secondary | ICD-10-CM

## 2015-08-19 ENCOUNTER — Telehealth: Payer: Self-pay | Admitting: Obstetrics & Gynecology

## 2015-08-19 NOTE — Telephone Encounter (Signed)
Patient calling requesting her most recent blood pressure results.

## 2015-08-19 NOTE — Telephone Encounter (Signed)
Left message to call Kaitlyn at [phone]. 

## 2015-08-19 NOTE — Telephone Encounter (Signed)
Spoke with patient. Advised BP at appointment on 06/16/2015 was 104/60. Patient is agreeable. Will return call if she needs anything further.  Routing to provider for final review. Patient agreeable to disposition. Will close encounter.

## 2015-10-02 ENCOUNTER — Telehealth: Payer: Self-pay | Admitting: Internal Medicine

## 2015-10-02 MED ORDER — PREDNISONE 10 MG PO TABS: ORAL_TABLET | ORAL | Status: DC

## 2015-10-02 NOTE — Telephone Encounter (Signed)
Patient returned call, please call after 2:30 pm, [phone].

## 2015-10-02 NOTE — Telephone Encounter (Signed)
Called and spoke to pt. Pt c/o wheezing, slight labored breathing with activity, chest tightness and congestion, prod cough with little mucus production - clear and white in color, sinus pressure and blowing out clear mucus, s/s present x 1 week. Pt denies f/c/s. Pt has yearly f/u in 03/2016 with CY.   Dr. Maple HudsonYoung please advise. Thanks.   Allergies  Allergen Reactions  . Albuterol Other (See Comments)    High Heart Rate Jitters  . Azithromycin Other (See Comments)    Stomach cramps    Current Outpatient Prescriptions on File Prior to Visit  Medication Sig Dispense Refill  . Cetirizine HCl (ZYRTEC PO) Take by mouth as needed.     Marland Kitchen. EPINEPHrine 0.3 mg/0.3 mL IJ SOAJ injection For severe allergic reaction 1 Device prn  . levalbuterol (XOPENEX HFA) 45 MCG/ACT inhaler Inhale 2 puffs into the lungs every 6 (six) hours as needed for wheezing. prn 1 Inhaler 12  . NONFORMULARY OR COMPOUNDED ITEM Allergy Vaccine 1:10 Given at Home    . Polyethylene Glycol 3350 (MIRALAX PO) Take by mouth as needed.     . Tuberculin-Allergy Syringes 25G X 5/8" 1 ML MISC Use as directed to inject allergy vaccine 100 each 0  . UNABLE TO FIND once a week. Med Name: Allergy Shots    . valACYclovir (VALTREX) 1000 MG tablet Take as directed 30 tablet 3   No current facility-administered medications on file prior to visit.

## 2015-10-02 NOTE — Telephone Encounter (Signed)
Offer prednisone  10 mg, 3 x 2 days, 2 x 2 days, 1 x 2 days      # 12  She can also use mucinex-DM otc

## 2015-10-02 NOTE — Telephone Encounter (Signed)
[id number] x1 

## 2015-10-02 NOTE — Telephone Encounter (Signed)
Called spoke with pt. Aware of recs. RX sent in. Nothing further needed 

## 2015-11-26 ENCOUNTER — Telehealth: Payer: Self-pay | Admitting: Internal Medicine

## 2015-11-26 NOTE — Telephone Encounter (Signed)
Called pt. lmomtcb & tell me wether or not she wanted me to mail her vac. & verify address. Pt. Just called back give info I needed, I told her it would be Thurs. Before I could make this up and send it out. Pt. Was ok with that. Will leave encounter open to put vac. In epic.

## 2015-12-02 NOTE — Telephone Encounter (Signed)
Allergy Serum Extract Date Mixed: 12/01/14 Vial: 2 Strength: 1:10 Here/Mail/Pick Up: mail Mixed By: tbs Last OV: 03/17/15 Pending OV: 03/15/16

## 2015-12-04 DIAGNOSIS — J309 Allergic rhinitis, unspecified: Secondary | ICD-10-CM | POA: Diagnosis not present

## 2016-02-02 ENCOUNTER — Telehealth: Payer: Self-pay | Admitting: Internal Medicine

## 2016-02-02 NOTE — Telephone Encounter (Signed)
Spoke with pt, c/o sore throat, nonprod cough, headaches, chest tightness X1 day.   Denies mucus production, fever, sinus congestion.  Pt has taken aleve cold and sinus.  [facility] [address]    Last ov: 03/17/15 Next ov: 03/15/16  CY please advise on recs.  Thanks!   Allergies  Allergen Reactions  . Albuterol Other (See Comments)    High Heart Rate Jitters  . Azithromycin Other (See Comments)    Stomach cramps   Current Outpatient Prescriptions on File Prior to Visit  Medication Sig Dispense Refill  . Cetirizine HCl (ZYRTEC PO) Take by mouth as needed.     Marland Kitchen. EPINEPHrine 0.3 mg/0.3 mL IJ SOAJ injection For severe allergic reaction 1 Device prn  . levalbuterol (XOPENEX HFA) 45 MCG/ACT inhaler Inhale 2 puffs into the lungs every 6 (six) hours as needed for wheezing. prn 1 Inhaler 12  . NONFORMULARY OR COMPOUNDED ITEM Allergy Vaccine 1:10 Given at Home    . Polyethylene Glycol 3350 (MIRALAX PO) Take by mouth as needed.     . predniSONE (DELTASONE) 10 MG tablet Take 3 tabs daily x 2 days, 2 tabs daily x 2 days, 1 tab daily x 2 days then stop 12 tablet 0  . Tuberculin-Allergy Syringes 25G X 5/8" 1 ML MISC Use as directed to inject allergy vaccine 100 each 0  . UNABLE TO FIND once a week. Med Name: Allergy Shots    . valACYclovir (VALTREX) 1000 MG tablet Take as directed 30 tablet 3   No current facility-administered medications on file prior to visit.

## 2016-02-02 NOTE — Telephone Encounter (Signed)
Sounds like a virus infection "cold" right now.  If gets worse, with green or wheeze let us know. Right now antibiotics won't help.  Ok to use otc cold remedies, cough syrup, etc.

## 2016-02-02 NOTE — Telephone Encounter (Signed)
Spoke with the pt and notified of recs per CDY  She verbalized understanding  Nothing further needed 

## 2016-02-03 ENCOUNTER — Ambulatory Visit (INDEPENDENT_AMBULATORY_CARE_PROVIDER_SITE_OTHER): Payer: BC Managed Care – PPO | Admitting: Family Medicine

## 2016-02-03 VITALS — BP 110/72 | HR 76 | Temp 97.7°F | Resp 16 | Ht 64.0 in | Wt 110.2 lb

## 2016-02-03 DIAGNOSIS — J209 Acute bronchitis, unspecified: Secondary | ICD-10-CM

## 2016-02-03 DIAGNOSIS — M791 Myalgia: Secondary | ICD-10-CM

## 2016-02-03 DIAGNOSIS — R070 Pain in throat: Secondary | ICD-10-CM

## 2016-02-03 DIAGNOSIS — J4520 Mild intermittent asthma, uncomplicated: Secondary | ICD-10-CM

## 2016-02-03 DIAGNOSIS — R059 Cough, unspecified: Secondary | ICD-10-CM

## 2016-02-03 DIAGNOSIS — R05 Cough: Secondary | ICD-10-CM | POA: Diagnosis not present

## 2016-02-03 DIAGNOSIS — J069 Acute upper respiratory infection, unspecified: Secondary | ICD-10-CM

## 2016-02-03 DIAGNOSIS — R509 Fever, unspecified: Secondary | ICD-10-CM | POA: Diagnosis not present

## 2016-02-03 LAB — POCT INFLUENZA A/B
INFLUENZA A, POC: NEGATIVE
Influenza B, POC: NEGATIVE

## 2016-02-03 LAB — POCT RAPID STREP A (OFFICE): RAPID STREP A SCREEN: NEGATIVE

## 2016-02-03 MED ORDER — LEVALBUTEROL HCL 0.63 MG/3ML IN NEBU
0.6300 mg | INHALATION_SOLUTION | Freq: Once | RESPIRATORY_TRACT | Status: AC
  Administered 2016-02-03: 0.63 mg via RESPIRATORY_TRACT

## 2016-02-03 MED ORDER — CETIRIZINE-PSEUDOEPHEDRINE ER 5-120 MG PO TB12: 1.0000 | ORAL_TABLET | Freq: Two times a day (BID) | ORAL | Status: DC

## 2016-02-03 MED ORDER — BENZONATATE 200 MG PO CAPS: 200.0000 mg | ORAL_CAPSULE | Freq: Three times a day (TID) | ORAL | Status: DC | PRN

## 2016-02-03 NOTE — Progress Notes (Signed)
Subjective:  By signing my name below, I, Essence Howell, attest that this documentation has been prepared under the direction and in the presence of Norberto Sorenson, MD Electronically Signed: Charline Bills, ED Scribe 02/03/2016 at 2:57 PM.   Patient ID: Morgan Wood, female    DOB: [birth year], 40 y.o.   MRN: [id number] Chief Complaint  Patient presents with  . Headache  . Sore Throat  . Chills  . Cough   HPI HPI Comments: Morgan Wood is a 40 y.o. female, with a h/o asthma and environmental allergies, who presents to the [facility] complaining of gradually worsening sore throat for the past 3 days. Pt states that symptoms started as chest tightness 4 days ago that has improved. She reports associated symptoms of intermittent fever, chills, minimally productive cough, generalized body aches, HA. She has tried Aleve Cold and Sinus as well as NyQuil Cold and Sinus with mild relief. Pt denies any urinary or bowel changes. She reports sick contact with strep throat. Pt has not received an influenza vaccine this year. No h/o tonsillectomy. She receives allergy shots from Dr. Maple Hudson with [facility].   Pt is a second grade teacher.   Past Medical History  Diagnosis Date  . Allergic rhinitis   . Asthma   . Headache(784.0)   . Environmental allergies     rash,swelling in throat,itching  . Bronchitis     1-2 episodes yearly  . Laryngitis     2-3episodes yearly   Current Outpatient Prescriptions on File Prior to Visit  Medication Sig Dispense Refill  . Cetirizine HCl (ZYRTEC PO) Take by mouth as needed.     Marland Kitchen EPINEPHrine 0.3 mg/0.3 mL IJ SOAJ injection For severe allergic reaction 1 Device prn  . levalbuterol (XOPENEX HFA) 45 MCG/ACT inhaler Inhale 2 puffs into the lungs every 6 (six) hours as needed for wheezing. prn 1 Inhaler 12  . NONFORMULARY OR COMPOUNDED ITEM Allergy Vaccine 1:10 Given at Home    . Polyethylene Glycol 3350 (MIRALAX PO) Take by mouth as needed.      . Tuberculin-Allergy Syringes 25G X 5/8" 1 ML MISC Use as directed to inject allergy vaccine 100 each 0  . UNABLE TO FIND once a week. Med Name: Allergy Shots    . predniSONE (DELTASONE) 10 MG tablet Take 3 tabs daily x 2 days, 2 tabs daily x 2 days, 1 tab daily x 2 days then stop (Patient not taking: Reported on 02/03/2016) 12 tablet 0  . valACYclovir (VALTREX) 1000 MG tablet Take as directed (Patient not taking: Reported on 02/03/2016) 30 tablet 3   No current facility-administered medications on file prior to visit.   Allergies  Allergen Reactions  . Albuterol Other (See Comments)    High Heart Rate Jitters  . Azithromycin Other (See Comments)    Stomach cramps   Review of Systems  Constitutional: Positive for fever (intermittent) and chills.  HENT: Positive for sore throat.   Respiratory: Positive for cough and chest tightness.   Gastrointestinal: Negative for constipation.  Genitourinary: Negative for difficulty urinating.  Musculoskeletal: Positive for myalgias.  Allergic/Immunologic: Positive for environmental allergies.  Neurological: Positive for headaches.  BP 110/72 mmHg  Pulse 76  Temp(Src) 97.7 F (36.5 C) (Oral)  Resp 16  Ht  (1.626 m)  Wt 110 lb 3.2 oz (49.986 kg)  BMI 18.91 kg/m2  SpO2 99%  LMP 01/27/2016    Objective:   Physical Exam  Constitutional: She is oriented  to person, place, and time. She appears well-developed and well-nourished. No distress.  HENT:  Head: Normocephalic and atraumatic.  Right Ear: A middle ear effusion is present.  Left Ear: A middle ear effusion is present.  Nose: Mucosal edema present.  Mouth/Throat: Posterior oropharyngeal erythema present.  Postnasal drip.   Eyes: Conjunctivae and EOM are normal.  Neck: Neck supple. No tracheal deviation present. No thyroid mass and no thyromegaly present.  Cardiovascular: Normal rate, regular rhythm, S1 normal, S2 normal and normal heart sounds.   Pulmonary/Chest: Effort normal. No  respiratory distress.  Lungs are clear to auscultation. Slight decreased expiratory phase.   Musculoskeletal: Normal range of motion.  Lymphadenopathy:    She has no cervical adenopathy.  Neurological: She is alert and oriented to person, place, and time.  Skin: Skin is warm and dry.  Psychiatric: She has a normal mood and affect. Her behavior is normal.  Nursing note and vitals reviewed.  Results for orders placed or performed in visit on 02/03/16  POCT rapid strep A  Result Value Ref Range   Rapid Strep A Screen Negative Negative  POCT Influenza A/B  Result Value Ref Range   Influenza A, POC Negative Negative   Influenza B, POC Negative Negative      Assessment & Plan:   1. Cough   2. Acute bronchitis, unspecified organism   3. Asthma, mild intermittent, well-controlled   4. Acute URI     Orders Placed This Encounter  Procedures  . Culture, Group A Strep    Order Specific Question:  Source    Answer:  throat  . POCT rapid strep A  . POCT Influenza A/B    Meds ordered this encounter  Medications  . levalbuterol (XOPENEX) nebulizer solution 0.63 mg    Sig:   . benzonatate (TESSALON) 200 MG capsule    Sig: Take 1 capsule (200 mg total) by mouth 3 (three) times daily as needed for cough.    Dispense:  40 capsule    Refill:  0  . cetirizine-pseudoephedrine (ZYRTEC-D) 5-120 MG tablet    Sig: Take 1 tablet by mouth 2 (two) times daily.    Dispense:  28 tablet    Refill:  1    I personally performed the services described in this documentation, which was scribed in my presence. The recorded information has been reviewed and considered, and addended by me as needed.  Norberto SorensonEva Shaw, MD MPH

## 2016-02-03 NOTE — Patient Instructions (Addendum)
IF you received an x-ray today, you will receive an invoice from [facility]. Please contact [facility] at [phone] with questions or concerns regarding your invoice.   IF you received labwork today, you will receive an invoice from [facility]. Please contact [facility] at [phone] with questions or concerns regarding your invoice.   Our billing staff will not be able to assist you with questions regarding bills from these companies.  You will be contacted with the lab results as soon as they are available. The fastest way to get your results is to activate your My Chart account. Instructions are located on the last page of this paperwork. If you have not heard from us regarding the results in 2 weeks, please contact this office.   Upper Respiratory Infection, Adult Most upper respiratory infections (URIs) are a viral infection of the air passages leading to the lungs. A URI affects the nose, throat, and upper air passages. The most common type of URI is nasopharyngitis and is typically referred to as "the common cold." URIs run their course and usually go away on their own. Most of the time, a URI does not require medical attention, but sometimes a bacterial infection in the upper airways can follow a viral infection. This is called a secondary infection. Sinus and middle ear infections are common types of secondary upper respiratory infections. Bacterial pneumonia can also complicate a URI. A URI can worsen asthma and chronic obstructive pulmonary disease (COPD). Sometimes, these complications can require emergency medical care and may be life threatening.  CAUSES Almost all URIs are caused by viruses. A virus is a type of germ and can spread from one person to another.  RISKS FACTORS You may be at risk for a URI if:   You smoke.   You have chronic heart or lung disease.  You have a weakened defense (immune) system.   You are very young  or very old.   You have nasal allergies or asthma.  You work in crowded or poorly ventilated areas.  You work in health care facilities or schools. SIGNS AND SYMPTOMS  Symptoms typically develop 2-3 days after you come in contact with a cold virus. Most viral URIs last 7-10 days. However, viral URIs from the influenza virus (flu virus) can last 14-18 days and are typically more severe. Symptoms may include:   Runny or stuffy (congested) nose.   Sneezing.   Cough.   Sore throat.   Headache.   Fatigue.   Fever.   Loss of appetite.   Pain in your forehead, behind your eyes, and over your cheekbones (sinus pain).  Muscle aches.  DIAGNOSIS  Your health care provider may diagnose a URI by:  Physical exam.  Tests to check that your symptoms are not due to another condition such as:  Strep throat.  Sinusitis.  Pneumonia.  Asthma. TREATMENT  A URI goes away on its own with time. It cannot be cured with medicines, but medicines may be prescribed or recommended to relieve symptoms. Medicines may help:  Reduce your fever.  Reduce your cough.  Relieve nasal congestion. HOME CARE INSTRUCTIONS   Take medicines only as directed by your health care provider.   Gargle warm saltwater or take cough drops to comfort your throat as directed by your health care provider.  Use a warm mist humidifier or inhale steam from a shower to increase air moisture. This may make it easier to breathe.  Drink enough fluid to keep your   urine clear or pale yellow.   Eat soups and other clear broths and maintain good nutrition.   Rest as needed.   Return to work when your temperature has returned to normal or as your health care provider advises. You may need to stay home longer to avoid infecting others. You can also use a face mask and careful hand washing to prevent spread of the virus.  Increase the usage of your inhaler if you have asthma.   Do not use any tobacco  products, including cigarettes, chewing tobacco, or electronic cigarettes. If you need help quitting, ask your health care provider. PREVENTION  The best way to protect yourself from getting a cold is to practice good hygiene.   Avoid oral or hand contact with people with cold symptoms.   Wash your hands often if contact occurs.  There is no clear evidence that vitamin C, vitamin E, echinacea, or exercise reduces the chance of developing a cold. However, it is always recommended to get plenty of rest, exercise, and practice good nutrition.  SEEK MEDICAL CARE IF:   You are getting worse rather than better.   Your symptoms are not controlled by medicine.   You have chills.  You have worsening shortness of breath.  You have brown or red mucus.  You have yellow or brown nasal discharge.  You have pain in your face, especially when you bend forward.  You have a fever.  You have swollen neck glands.  You have pain while swallowing.  You have white areas in the back of your throat. SEEK IMMEDIATE MEDICAL CARE IF:   You have severe or persistent:  Headache.  Ear pain.  Sinus pain.  Chest pain.  You have chronic lung disease and any of the following:  Wheezing.  Prolonged cough.  Coughing up blood.  A change in your usual mucus.  You have a stiff neck.  You have changes in your:  Vision.  Hearing.  Thinking.  Mood. MAKE SURE YOU:   Understand these instructions.  Will watch your condition.  Will get help right away if you are not doing well or get worse.   This information is not intended to replace advice given to you by your health care provider. Make sure you discuss any questions you have with your health care provider.   Document Released: 04/13/2001 Document Revised: 03/04/2015 Document Reviewed: 01/23/2014 Elsevier Interactive Patient Education 2016 [employer] Inc.  

## 2016-02-05 LAB — CULTURE, GROUP A STREP: Organism ID, Bacteria: NORMAL

## 2016-02-09 ENCOUNTER — Telehealth: Payer: Self-pay | Admitting: Internal Medicine

## 2016-02-09 MED ORDER — PREDNISONE 10 MG PO TABS: ORAL_TABLET | ORAL | Status: DC

## 2016-02-09 NOTE — Telephone Encounter (Signed)
Offer prednisone 10 mg, # 20      2 daily x 3 days, then one daily while needed

## 2016-02-09 NOTE — Telephone Encounter (Signed)
Pt aware that Rx is being sent to pharmacy for Prednisone. Nothing further needed.

## 2016-02-09 NOTE — Telephone Encounter (Signed)
Spoke with pt. States her symptoms from last week have gotten worse. Reports cough, wheezing, chest tightness and sinus pressure. Cough is producing yellow mucus. Denies SOB or fever. Went to urgent care over the weekend was diagnosed with a URI but was not given any medication.  Allergies  Allergen Reactions  . Albuterol Other (See Comments)    High Heart Rate Jitters  . Azithromycin Other (See Comments)    Stomach cramps   Current Outpatient Prescriptions on File Prior to Visit  Medication Sig Dispense Refill  . benzonatate (TESSALON) 200 MG capsule Take 1 capsule (200 mg total) by mouth 3 (three) times daily as needed for cough. 40 capsule 0  . cetirizine-pseudoephedrine (ZYRTEC-D) 5-120 MG tablet Take 1 tablet by mouth 2 (two) times daily. 28 tablet 1  . EPINEPHrine 0.3 mg/0.3 mL IJ SOAJ injection For severe allergic reaction 1 Device prn  . levalbuterol (XOPENEX HFA) 45 MCG/ACT inhaler Inhale 2 puffs into the lungs every 6 (six) hours as needed for wheezing. prn 1 Inhaler 12  . NONFORMULARY OR COMPOUNDED ITEM Allergy Vaccine 1:10 Given at Home    . Polyethylene Glycol 3350 (MIRALAX PO) Take by mouth as needed.     . Tuberculin-Allergy Syringes 25G X 5/8" 1 ML MISC Use as directed to inject allergy vaccine 100 each 0  . UNABLE TO FIND once a week. Med Name: Allergy Shots    . [DISCONTINUED] Cetirizine HCl (ZYRTEC PO) Take by mouth as needed.      No current facility-administered medications on file prior to visit.    CY - please advise. Thanks.

## 2016-03-15 ENCOUNTER — Encounter: Payer: Self-pay | Admitting: Internal Medicine

## 2016-03-15 ENCOUNTER — Ambulatory Visit (INDEPENDENT_AMBULATORY_CARE_PROVIDER_SITE_OTHER): Payer: BC Managed Care – PPO | Admitting: Internal Medicine

## 2016-03-15 VITALS — BP 114/72 | HR 69 | Ht 63.0 in | Wt 113.0 lb

## 2016-03-15 DIAGNOSIS — J301 Allergic rhinitis due to pollen: Secondary | ICD-10-CM

## 2016-03-15 DIAGNOSIS — J4520 Mild intermittent asthma, uncomplicated: Secondary | ICD-10-CM | POA: Diagnosis not present

## 2016-03-15 MED ORDER — LEVALBUTEROL TARTRATE 45 MCG/ACT IN AERO: 2.0000 | INHALATION_SPRAY | Freq: Four times a day (QID) | RESPIRATORY_TRACT | Status: DC | PRN

## 2016-03-15 MED ORDER — EPINEPHRINE 0.3 MG/0.3ML IJ SOAJ: INTRAMUSCULAR | Status: DC

## 2016-03-15 MED ORDER — VALACYCLOVIR HCL 1 G PO TABS: 1000.0000 mg | ORAL_TABLET | Freq: Two times a day (BID) | ORAL | Status: DC

## 2016-03-15 NOTE — Progress Notes (Signed)
Patient ID: Morgan DevonJamie M Soliz, female    DOB: [birth year], 40 y.o.   MRN: [id number]  HPI 03/08/11- [age] yoF  Never smoker, followed for rhinosinusitis and asthmatic bronchitis.  Last here Sep 07, 2010. She got through the winter well. She credits her allergy shots for an unusually good spring, with little stuffiness. Taking only an occasional Z pak  if she plans to be outside.  She uses Qvar in stretches, but complains of cost. Asks about using her rescue inhaler instead.  Recognizes some of her chest tightness is due to anxiety.   09/06/11- 35 yoF  Never smoker, followed for rhinosinusitis and asthmatic bronchitis.  She should cough one head cold earlier in the summer but since then has felt well. 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 She is doing well now with allergy vaccine at 1:10 and she is not interested in making any changes. She does use Neti pot when needed.   03/13/12- 35 yoF  Never smoker, followed for rhinosinusitis and asthmatic bronchitis. Wheezing is worse at night when laying down. Incidentally noted increased wheeze over the last 2 months. Chronic pressure sensation under her lower left anterior costal margin. We discussed anatomy in that area. Her primary physician gave gabapentin "to calm my nerves", and I think she considers herself an anxious person.  09/14/12- 36 yoF  Never smoker, followed for rhinosinusitis and asthmatic bronchitis. Continues allergy vaccine 1:10 GO Recent prednisone for sinusitis. Frequent colds. 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  02/14/2013 acute ov/Wert  Chief Complaint  Patient presents with  . Acute Visit    Pt c/o SOB with or without exertion x 3 days. She is also having dull, achy pain under left breast that radiates to her back. Pain worse with deep inspiration and after eating.    breathing worse x one week  with nasal congestion/ green mucus then acute L pleuritic cp x 3 days "like a dull ache" made worse p eating or with deep breath, no better when lie down. No gagging or vomiting or fever. Not on bcps, no h/o travel, leg symptoms Sleeping ok without nocturnal  or early am exacerbation  of respiratory  c/o's or need for noct saba. Also denies any obvious fluctuation of symptoms with weather or environmental changes or other aggravating or alleviating factors except as outlined above   03/15/13-   37 yoF  Never smoker, followed for rhinosinusitis and asthmatic bronchitis. FOLLOWS FOR: had flare up allergies mid of April-seen by MW. 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 Continues allergy vaccine 1:10 GO. Has done well since her acute visit in April. We again talked about her chronic pressure sensation in the left upper quadrant/left lower anterior costal margin. There has been no change. It does not seem to be a respiratory pattern. CXR 02/14/13  IMPRESSION:  Hyperinflated lungs.  No acute abnormalities.  Original Report Authenticated By: Ulyses SouthwardMark Boles, M.  09/17/13- 37 yoF  Never smoker, followed for rhinosinusitis and asthmatic bronchitis Follows for- 23month rov.  Pt c/o sinus congestion, postnasal drip, mostly dry cough, runny nose. Continues Allergy Vaccine 1:10 GO, doing well. She feels it is worth continuing. Incidental postnasal drip with a recent sore throat. No headache. Still some nasal congestion.  03/18/14- 38 yoF  Never smoker, followed for rhinosinusitis and asthmatic bronchitis FOLLOWS  FOR: still on Allergy vaccine 1:10 GO; has been doing okay overall; had troubles past 2 weeks.  she missed her allergy vaccine 1 week and says "that made a difference" with chest tightness and nasal congestion. Otherwise no wheeze or cough since a cold during the winter. Rare need for rescue inhaler.  03/17/15- 39 yoF  Never smoker, followed for rhinosinusitis and asthmatic  bronchitis FOLLOWS FOR still on Allergy vaccine 1:10 GO. 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 Discussed. Infrequent need for Xopenex rescue inhaler but it is much better tolerated than albuterol.  03/15/2016-40 year old female never smoker followed for Rhinosinusitis, asthmatic bronchitis Allergy Vaccine 1:10 GO FOLLOWS FOR: Pt still on vaccine and no reactions. Pt states she is doing well overall at this time. Pt would like to have Rx's printed for Valtrex, Epipen, and Xopenex HFA. Now feeling well nearing end of spring pollen season. Medications discussed. Infrequent need for rescue inhaler and no sleep disturbance.   ROS-see HPI Constitutional:   No-   weight loss, night sweats, fevers, chills, fatigue, lassitude. HEENT:   No-  headaches, difficulty swallowing, tooth/dental problems, +sore throat,       No-  sneezing, itching, ear ache, +nasal congestion, +post nasal drip,  CV:  No-   chest pain, orthopnea, PND, swelling in lower extremities, anasarca, dizziness, palpitations Resp: No-   shortness of breath with exertion or at rest.              No-   productive cough,  No non-productive cough,  No- coughing up of blood.              No-   change in color of mucus.  No- wheezing.   Skin: No-   rash or lesions. GI:  No-   heartburn, indigestion, abdominal pain, nausea, vomiting,  GU: . MS:  No-   joint pain or swelling.   Neuro-     nothing unusual Psych:  No- change in mood or affect. No depression or anxiety.  No memory loss.  Objective:   Physical Exam amb wf nad General- Alert, Oriented, Affect-appropriate, Distress- none acute, slender Skin- rash-none, lesions- none, excoriation- none Lymphadenopathy- none Head- atraumatic            Eyes- Gross vision intact, PERRLA, conjunctivae clear secretions            Ears- Hearing, canals-normal, TMs clear            Nose- + minimal turbinate edema, no-Septal dev, polyps,  erosion, perforation             Throat- Mallampati II , mucosa clear , drainage- none, tonsils- present Neck- flexible , trachea midline, no stridor , thyroid nl, carotid no bruit Chest - symmetrical excursion , unlabored           Heart/CV- RRR , no murmur , no gallop  , no rub, nl s1 s2                            JVD- trace/ fills from above , edema- none, stasis changes- none, varices- none           Lung- clear to P&A, wheeze- none, cough- none , dullness-none, rub- none           Chest wall-  Abd-  Soft, no tenderness, with particular attention to left upper quadrant  Extrem-  cyanosis- none, clubbing, none, atrophy- none, strength- nl

## 2016-03-15 NOTE — Patient Instructions (Signed)
We can continue allergy vaccine through this year  Refills sent for valtrex, xopenex inhaler and epipen  Please call as needed

## 2016-03-16 NOTE — Assessment & Plan Note (Signed)
We discussed anticipated closing of allergy clinic here in a year. Discussed her options. I think she could stop allergy vaccine and wait to see how she does Brother than assuming she has to be retested right away somewhere else.

## 2016-03-16 NOTE — Assessment & Plan Note (Signed)
Mild intermittent uncomplicated well controlled Plan-medications reviewed. Refill if needed.

## 2016-05-18 ENCOUNTER — Ambulatory Visit (INDEPENDENT_AMBULATORY_CARE_PROVIDER_SITE_OTHER): Payer: BC Managed Care – PPO | Admitting: Family Medicine

## 2016-05-18 ENCOUNTER — Encounter: Payer: Self-pay | Admitting: Family Medicine

## 2016-05-18 VITALS — BP 112/76 | HR 73 | Ht 63.5 in | Wt 111.9 lb

## 2016-05-18 DIAGNOSIS — M5412 Radiculopathy, cervical region: Secondary | ICD-10-CM

## 2016-05-18 DIAGNOSIS — M7551 Bursitis of right shoulder: Secondary | ICD-10-CM

## 2016-05-18 DIAGNOSIS — J3089 Other allergic rhinitis: Secondary | ICD-10-CM

## 2016-05-18 DIAGNOSIS — J4520 Mild intermittent asthma, uncomplicated: Secondary | ICD-10-CM

## 2016-05-18 DIAGNOSIS — R1011 Right upper quadrant pain: Secondary | ICD-10-CM

## 2016-05-18 DIAGNOSIS — M67919 Unspecified disorder of synovium and tendon, unspecified shoulder: Secondary | ICD-10-CM | POA: Insufficient documentation

## 2016-05-18 DIAGNOSIS — K5900 Constipation, unspecified: Secondary | ICD-10-CM

## 2016-05-18 DIAGNOSIS — M719 Bursopathy, unspecified: Secondary | ICD-10-CM

## 2016-05-18 DIAGNOSIS — Z9889 Other specified postprocedural states: Secondary | ICD-10-CM

## 2016-05-18 MED ORDER — CETIRIZINE-PSEUDOEPHEDRINE ER 5-120 MG PO TB12: 1.0000 | ORAL_TABLET | Freq: Two times a day (BID) | ORAL | Status: DC

## 2016-05-18 NOTE — Assessment & Plan Note (Addendum)
Patient is well controlled. Uses her inhaler less than once monthly. Advised we could consider Singulair in the future if symptoms increase but for now using rescue inhaler seldomly is preferred.

## 2016-05-18 NOTE — Assessment & Plan Note (Signed)
Chronic, comes and goes. Currently asymptomatic. Patient needs to drink more water.

## 2016-05-18 NOTE — Assessment & Plan Note (Signed)
Nettie pot /sinus rinse twice a day

## 2016-05-18 NOTE — Assessment & Plan Note (Signed)
Recommend twice a day sinus rinses such as new med sinus rinse for prevention and take Zyrtec or Claritin or the like daily. Recommend avoidance of exposure as much as possible. After prolonged exposure do nasal rinses.

## 2016-05-18 NOTE — Progress Notes (Signed)
Carlye Grippe, D.O. Primary care at [facility]   Subjective:    Chief Complaint  Patient presents with  . Establish Care  . Shoulder Pain   New pt, here to establish care.   HPI: Morgan Wood is a pleasant 40 y.o. female who presents to [facility] at [facility] today To establish care.  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  No kids.  2 maltese, 1 shtitzu.  Lives with parents.  Softball, reads, movies. Travel.   Off allergy shots one mo--> not taking anything. Only Zyrtec prn and no other preventative strategies.   [facility]--> saw them recently for R hand palmar tendinitis but patient thinks it's neuroma or cyst.  RShoulder pain- since March- after playing s-ball.  Does not remember a specific injury that caused this.    Hurts to have arm in ext rotation; and abduction.   moving furniture- W, and any overhead activities.   better w- rest. Tried no meds, no ice regualrly, no PT.   Goal is to get back to playing s-ball w/o pain esp fall ball and be able to lift things without pain  Patient's entire past medical history is reviewed with her today.     Past Medical History  Diagnosis Date  . Allergic rhinitis   . Asthma   . Headache(784.0)   . Environmental allergies     rash,swelling in throat,itching  . Bronchitis     1-2 episodes yearly  . Laryngitis     2-3episodes yearly  . Allergy       Past Surgical History  Procedure Laterality Date  . Nasal septum surgery  11/01/2002  . Wisdom tooth extraction    . Eus  10/13/2012    Procedure: UPPER ENDOSCOPIC ULTRASOUND (EUS) LINEAR;  Surgeon: Theda Belfast, MD;  Location: [facility];  Service: Endoscopy;  Laterality: N/A;  . Bulging disc c3 c4  2006  . Upper gastrointestinal endoscopy  11/13; 12/13      Family History  Problem Relation Age of Onset  . Diabetes Other   . Heart disease Other   . Cancer Maternal Grandfather     skin cancer  . Heart attack Maternal Grandfather     . Emphysema Maternal Grandfather   . Parkinsonism Paternal Grandmother   . Heart attack Paternal Grandfather   . Hypertension Father   . Cancer Maternal Grandmother       History  Drug Use No  ,    History  Alcohol Use No  ,    History  Smoking status  . Never Smoker   Smokeless tobacco  . Never Used  ,     History  Sexual Activity  . Sexual Activity: No      Patient's Medications  New Prescriptions   No medications on file  Previous Medications   EPINEPHRINE 0.3 MG/0.3 ML IJ SOAJ INJECTION    For severe allergic reaction   LEVALBUTEROL (XOPENEX HFA) 45 MCG/ACT INHALER    Inhale 2 puffs into the lungs every 6 (six) hours as needed for wheezing. prn   NONFORMULARY OR COMPOUNDED ITEM    Allergy Vaccine 1:10 Given at Home   POLYETHYLENE GLYCOL 3350 (MIRALAX PO)    Take by mouth as needed.    TUBERCULIN-ALLERGY SYRINGES 25G X 5/8" 1 ML MISC    Use as directed to inject allergy vaccine   VALACYCLOVIR (VALTREX) 1000 MG TABLET    Take 1 tablet (1,000 mg  total) by mouth 2 (two) times daily.  Modified Medications   Modified Medication Previous Medication   CETIRIZINE-PSEUDOEPHEDRINE (ZYRTEC-D) 5-120 MG TABLET cetirizine-pseudoephedrine (ZYRTEC-D) 5-120 MG tablet      Take 1 tablet by mouth 2 (two) times daily. Take EVERY DAY not PRN    Take 1 tablet by mouth 2 (two) times daily.  Discontinued Medications   No medications on file     Albuterol and Azithromycin Outpatient Encounter Prescriptions as of 05/18/2016  Medication Sig  . cetirizine-pseudoephedrine (ZYRTEC-D) 5-120 MG tablet Take 1 tablet by mouth 2 (two) times daily. Take EVERY DAY not PRN  . EPINEPHrine 0.3 mg/0.3 mL IJ SOAJ injection For severe allergic reaction  . levalbuterol (XOPENEX HFA) 45 MCG/ACT inhaler Inhale 2 puffs into the lungs every 6 (six) hours as needed for wheezing. prn  . NONFORMULARY OR COMPOUNDED ITEM Allergy Vaccine 1:10 Given at Home  . Polyethylene Glycol 3350 (MIRALAX PO) Take  by mouth as needed.   . Tuberculin-Allergy Syringes 25G X 5/8" 1 ML MISC Use as directed to inject allergy vaccine  . valACYclovir (VALTREX) 1000 MG tablet Take 1 tablet (1,000 mg total) by mouth 2 (two) times daily.  . [DISCONTINUED] cetirizine-pseudoephedrine (ZYRTEC-D) 5-120 MG tablet Take 1 tablet by mouth 2 (two) times daily.   No facility-administered encounter medications on file as of 05/18/2016.    Immunization History  Administered Date(s) Administered  . H1N1 11/04/2008  . Influenza Split 09/06/2011  . Influenza Whole 09/01/2009, 09/07/2010, 09/01/2012  . Influenza,inj,Quad PF,36+ Mos 09/17/2013  . Tdap 02/19/2008    [facility] 02/03/2016 02/03/2016  Decreased Interest 0 0  Down, Depressed, Hopeless 0 0  PHQ - 2 Score 0 0    Review of Systems:   ( Completed via Adult Medical History Intake form today ) General:   Denies fever, chills, appetite changes, unexplained weight loss.  Optho/Auditory:   Denies visual changes, blurred vision/LOV, ringing in ears/ diff hearing Respiratory:   Denies SOB, DOE, cough, wheezing.  Cardiovascular:   Denies chest pain, palpitations, new onset peripheral edema  Gastrointestinal:   Denies nausea, vomiting, diarrhea.  Genitourinary:    Denies dysuria, increased frequency, flank pain.  Endocrine:     Denies hot or cold intolerance, polyuria, polydipsia. Musculoskeletal:  Denies unexplained myalgias, joint swelling, arthralgias, gait problems.  Skin:  Denies rash, suspicious lesions or new/ changes in moles Neurological:    Denies dizziness, syncope, unexplained weakness, lightheadedness, numbness  Psychiatric/Behavioral:   Denies mood changes, suicidal or homicidal ideations, hallucinations    Objective:   Blood pressure 112/76, pulse 73, height 5' 3.5" (1.613 m), weight 111 lb 14.4 oz (50.758 kg), last menstrual period 04/26/2016. Body mass index is 19.51 kg/(m^2).  General: Well Developed, well nourished, and in no acute  distress.  Neuro: Alert and oriented x3, extra-ocular muscles intact, sensation grossly intact.  HEENT: Normocephalic, atraumatic, pupils equal round reactive to light, neck supple, no gross masses, no carotid bruits, no JVD apprec Skin: no gross suspicious lesions or rashes  Cardiac: Regular rate and rhythm, no murmurs rubs or gallops.  Respiratory: Essentially clear to auscultation bilaterally. Not using accessory muscles, speaking in full sentences.  Abdominal: Soft, not grossly distended Musculoskeletal: Ambulates w/o diff, FROM * 4 ext. Shoulder exam: normal exam, no swelling, tenderness, instability; ligaments intact, FROM shoulder joint. Some discomfort while in abduction as well as resistive internal rotation at 90deg.  NVI distally.  No boney ttp neck Vasc: less 2 sec cap RF, warm and  pink  Psych:  No HI/SI, judgement and insight good.    Impression and Recommendations:    The patient was counseled, risk factors were discussed, anticipatory guidance given.  Environmental and seasonal allergies Recommend twice a day sinus rinses such as new med sinus rinse for prevention and take Zyrtec or Claritin or the like daily. Recommend avoidance of exposure as much as possible. After prolonged exposure do nasal rinses.  R shoulder tendinitis-  Discussed with patient NSAIDs, ice, physical therapy and modification of activities. Avoid anything that aggravates her causes pain. No heavy lifting greater than 5-10 pounds.  Discussed with patient a conditioning program prior to engaging in sports.   Constipation Chronic, comes and goes. Currently asymptomatic. Patient needs to drink more water.  Asthma, mild intermittent, well-controlled Patient is well controlled. Uses her inhaler less than once monthly. Advised we could consider Singulair in the future if symptoms increase but for now using rescue inhaler seldomly is preferred.  Neuropathy, cervical (radicular) No current radicular  symptoms.  S/P correction of deviated nasal septum- chronic rhinitis and sinusitis Nettie pot /sinus rinse twice a day   Gross side effects, risk and benefits, and alternatives of medications discussed with patient.  Patient is aware that all medications have potential side effects and we are unable to predict every side effect or drug-drug interaction that may occur.  Expresses verbal understanding and consents to current therapy plan and treatment regimen.  Please see AVS handed out to patient at the end of our visit for further patient instructions/ counseling done pertaining to today's office visit.    Orders Placed This Encounter  Procedures  . Ambulatory referral to Physical Therapy    Referral Priority:  Routine    Referral Type:  Physical Medicine    Referral Reason:  Specialty Services Required    Requested Specialty:  Physical Therapy    Number of Visits Requested:  1     Note: This document was prepared using Dragon voice recognition software and may include unintentional dictation errors.

## 2016-05-18 NOTE — Assessment & Plan Note (Addendum)
Discussed with patient NSAIDs, ice, physical therapy and modification of activities. Avoid anything that aggravates her causes pain. No heavy lifting greater than 5-10 pounds.  Discussed with patient a conditioning program prior to engaging in sports.

## 2016-05-18 NOTE — Assessment & Plan Note (Signed)
No current radicular symptoms.

## 2016-05-18 NOTE — Patient Instructions (Addendum)
Ice, no aggravating activity of the right shoulder. No heavy lifting more than 5-10 pounds. Use Naprosyn over-the-counter as needed for pain. Physical therapy referral placed.

## 2016-05-20 ENCOUNTER — Telehealth: Payer: Self-pay

## 2016-05-20 NOTE — Telephone Encounter (Signed)
05/20/16 pt does not want to schedule PT due to deductible $1250 with 0 met

## 2016-06-21 ENCOUNTER — Other Ambulatory Visit (INDEPENDENT_AMBULATORY_CARE_PROVIDER_SITE_OTHER): Payer: BC Managed Care – PPO

## 2016-06-21 DIAGNOSIS — R1011 Right upper quadrant pain: Secondary | ICD-10-CM

## 2016-06-21 DIAGNOSIS — J3089 Other allergic rhinitis: Secondary | ICD-10-CM

## 2016-06-21 DIAGNOSIS — M5412 Radiculopathy, cervical region: Secondary | ICD-10-CM

## 2016-06-21 DIAGNOSIS — K5900 Constipation, unspecified: Secondary | ICD-10-CM

## 2016-06-21 DIAGNOSIS — J4520 Mild intermittent asthma, uncomplicated: Secondary | ICD-10-CM

## 2016-06-22 LAB — COMPLETE METABOLIC PANEL WITH GFR
ALT: 12 U/L (ref 6–29)
AST: 17 U/L (ref 10–30)
Albumin: 4 g/dL (ref 3.6–5.1)
Alkaline Phosphatase: 34 U/L (ref 33–115)
BILIRUBIN TOTAL: 0.5 mg/dL (ref 0.2–1.2)
BUN: 15 mg/dL (ref 7–25)
CHLORIDE: 105 mmol/L (ref 98–110)
CO2: 25 mmol/L (ref 20–31)
Calcium: 9.4 mg/dL (ref 8.6–10.2)
Creat: 0.81 mg/dL (ref 0.50–1.10)
GLUCOSE: 83 mg/dL (ref 65–99)
Potassium: 4.7 mmol/L (ref 3.5–5.3)
SODIUM: 140 mmol/L (ref 135–146)
TOTAL PROTEIN: 6.7 g/dL (ref 6.1–8.1)

## 2016-06-22 LAB — CBC WITH DIFFERENTIAL/PLATELET
BASOS PCT: 1 %
Basophils Absolute: 62 cells/uL (ref 0–200)
EOS PCT: 4 %
Eosinophils Absolute: 248 cells/uL (ref 15–500)
HCT: 39 % (ref 35.0–45.0)
Hemoglobin: 13 g/dL (ref 11.7–15.5)
LYMPHS PCT: 34 %
Lymphs Abs: 2108 cells/uL (ref 850–3900)
MCH: 29.5 pg (ref 27.0–33.0)
MCHC: 33.3 g/dL (ref 32.0–36.0)
MCV: 88.4 fL (ref 80.0–100.0)
MONOS PCT: 11 %
MPV: 11.5 fL (ref 7.5–12.5)
Monocytes Absolute: 682 cells/uL (ref 200–950)
NEUTROS ABS: 3100 {cells}/uL (ref 1500–7800)
Neutrophils Relative %: 50 %
PLATELETS: 245 10*3/uL (ref 140–400)
RBC: 4.41 MIL/uL (ref 3.80–5.10)
RDW: 13.7 % (ref 11.0–15.0)
WBC: 6.2 10*3/uL (ref 3.8–10.8)

## 2016-06-22 LAB — LIPID PANEL
CHOL/HDL RATIO: 2.5 ratio (ref <=5.0)
Cholesterol: 144 mg/dL (ref 125–200)
HDL: 57 mg/dL (ref >=46)
LDL CALC: 74 mg/dL (ref <130)
TRIGLYCERIDES: 65 mg/dL (ref <150)
VLDL: 13 mg/dL (ref <30)

## 2016-06-22 LAB — TSH: TSH: 2.34 m[IU]/L

## 2016-07-08 ENCOUNTER — Ambulatory Visit (INDEPENDENT_AMBULATORY_CARE_PROVIDER_SITE_OTHER): Payer: BC Managed Care – PPO | Admitting: Family Medicine

## 2016-07-08 ENCOUNTER — Encounter: Payer: Self-pay | Admitting: Family Medicine

## 2016-07-08 VITALS — BP 120/84 | HR 80 | Ht 63.5 in | Wt 114.3 lb

## 2016-07-08 DIAGNOSIS — J301 Allergic rhinitis due to pollen: Secondary | ICD-10-CM | POA: Diagnosis not present

## 2016-07-08 DIAGNOSIS — Z516 Encounter for desensitization to allergens: Secondary | ICD-10-CM

## 2016-07-08 DIAGNOSIS — J3089 Other allergic rhinitis: Secondary | ICD-10-CM

## 2016-07-08 DIAGNOSIS — N946 Dysmenorrhea, unspecified: Secondary | ICD-10-CM | POA: Diagnosis not present

## 2016-07-08 DIAGNOSIS — Z7189 Other specified counseling: Secondary | ICD-10-CM | POA: Diagnosis not present

## 2016-07-08 MED ORDER — NAPROXEN 500 MG PO TABS: ORAL_TABLET | ORAL | 1 refills | Status: DC

## 2016-07-08 NOTE — Patient Instructions (Signed)

## 2016-07-08 NOTE — Progress Notes (Signed)
Assessment and plan:  1. Counseling on health promotion and disease prevention   2. Dysmenorrhea   3. Environmental and seasonal allergies   4. Seasonal allergic rhinitis due to pollen   5. Goes for Allergy desensitization therapy      Reviewed patient's labs today and implications of them discussed. Extensive health counseling performed  Patient will continue NSAIDs for her dysmenorrhea. Discussed increasing exercise can improve cramps, warming pad prn, inc water intake  Patient goes for allergy shots_0 desensitization therapy.  Reminded we are going into allergy season and I encouraged sinus rinses twice a day in addition to her allergy medications  Patient prefers to get her yearly physical exam through GYN along with her mammograms etc.    Patient's Medications  New Prescriptions   NAPROXEN (NAPROSYN) 500 MG TABLET    Only take as needed for severe pain up to every 12 hrs  Previous Medications   CETIRIZINE-PSEUDOEPHEDRINE (ZYRTEC-D) 5-120 MG TABLET    Take 1 tablet by mouth 2 (two) times daily. Take EVERY DAY not PRN   EPINEPHRINE 0.3 MG/0.3 ML IJ SOAJ INJECTION    For severe allergic reaction   LEVALBUTEROL (XOPENEX HFA) 45 MCG/ACT INHALER    Inhale 2 puffs into the lungs every 6 (six) hours as needed for wheezing. prn   NONFORMULARY OR COMPOUNDED ITEM    Allergy Vaccine 1:10 Given at [place] (MIRALAX PO)    Take by mouth as needed.    TUBERCULIN-ALLERGY SYRINGES 25G X 5/8" 1 ML MISC    Use as directed to inject allergy vaccine   VALACYCLOVIR (VALTREX) 1000 MG TABLET    Take 1 tablet (1,000 mg total) by mouth 2 (two) times daily.  Modified Medications   No medications on file  Discontinued Medications   No medications on file    Return in about 6 months (around 01/05/2017) for For wellness exam & health maintenance evaluation.  Anticipatory guidance and routine counseling done  re: condition, txmnt options and need for follow up. All questions of patient's were answered.   Gross side effects, risk and benefits, and alternatives of medications discussed with patient.  Patient is aware that all medications have potential side effects and we are unable to predict every sideeffect or drug-drug interaction that may occur.  Expresses verbal understanding and consents to current therapy plan and treatment regiment.  Please see AVS handed out to patient at the end of our visit for additional patient instructions/ counseling done pertaining to today's office visit.  Note: This document was prepared using Dragon voice recognition software and may include unintentional dictation errors.   ----------------------------------------------------------------------------------------------------------------------  Subjective:   CC: Morgan Wood is a 40 y.o. female who presents to [place] at [facility] today for lab review/ health counseling.  No acute complaints today.  -- During patient's review of systems today she mentions her 'bad period cramps' day 1-2 of menses only- this is been going on many, many months. It has been evaluated by her GYN physician.  told by GYN to preemptively take NSAIDS for it- she does and it works well.     Wt Readings from Last 3 Encounters:  07/08/16 114 lb 4.8 oz (51.8 kg)  05/18/16 111 lb 14.4 oz (50.8 kg)  03/15/16 113 lb (51.3 kg)   BP Readings from Last 3 Encounters:  07/08/16 120/84  05/18/16 112/76  03/15/16 114/72   Pulse Readings from Last 3  Encounters:  07/08/16 80  05/18/16 73  03/15/16 69   Full medical history updated and reviewed in the office today  Patient Active Problem List   Diagnosis Date Noted  . Dysmenorrhea 07/11/2016  . Goes for Allergy desensitization therapy 07/11/2016  . S/P correction of deviated nasal septum- chronic rhinitis and sinusitis 05/18/2016  . Environmental and seasonal  allergies 05/18/2016  . h/o Abdominal pain, right upper quadrant 05/18/2016  . Neuropathy, cervical (radicular) 05/18/2016  . R shoulder tendinitis-  05/18/2016  . Constipation 06/16/2015  . Asthma, mild intermittent, well-controlled 04/29/2014  . Pain in right shin 05/03/2013  . Peroneal ganglion cyst 05/03/2013  . Chest pain 02/14/2013  . Allergic rhinitis due to pollen 03/14/2011    Past Medical History:  Diagnosis Date  . Allergic rhinitis   . Allergy   . Asthma   . Bronchitis    1-2 episodes yearly  . Environmental allergies    rash,swelling in throat,itching  . Headache(784.0)   . Laryngitis    2-3episodes yearly    Past Surgical History:  Procedure Laterality Date  . bulging disc C3 C4  2006  . EUS  10/13/2012   Procedure: UPPER ENDOSCOPIC ULTRASOUND (EUS) LINEAR;  Surgeon: Beryle Beams, MD;  Location: [facility];  Service: Endoscopy;  Laterality: N/A;  . NASAL SEPTUM SURGERY  11/01/2002  . UPPER GASTROINTESTINAL ENDOSCOPY  11/13; 12/13  . WISDOM TOOTH EXTRACTION      Social History  Substance Use Topics  . Smoking status: Never Smoker  . Smokeless tobacco: Never Used  . Alcohol use No    family history includes Cancer in her maternal grandfather and maternal grandmother; Diabetes in her other; Emphysema in her maternal grandfather; Heart attack in her maternal grandfather and paternal grandfather; Heart disease in her other; Hypertension in her father; Parkinsonism in her paternal grandmother.   Medications: Current Outpatient Prescriptions  Medication Sig Dispense Refill  . cetirizine-pseudoephedrine (ZYRTEC-D) 5-120 MG tablet Take 1 tablet by mouth 2 (two) times daily. Take EVERY DAY not PRN 28 tablet 1  . EPINEPHrine 0.3 mg/0.3 mL IJ SOAJ injection For severe allergic reaction 1 Device prn  . levalbuterol (XOPENEX HFA) 45 MCG/ACT inhaler Inhale 2 puffs into the lungs every 6 (six) hours as needed for wheezing. prn 1 Inhaler 12  . naproxen (NAPROSYN)  500 MG tablet Only take as needed for severe pain up to every 12 hrs 60 tablet 1  . NONFORMULARY OR COMPOUNDED ITEM Allergy Vaccine 1:10 Given at Home    . Polyethylene Glycol 3350 (MIRALAX PO) Take by mouth as needed.     . Tuberculin-Allergy Syringes 25G X 5/8" 1 ML MISC Use as directed to inject allergy vaccine 100 each 0  . valACYclovir (VALTREX) 1000 MG tablet Take 1 tablet (1,000 mg total) by mouth 2 (two) times daily. 4 tablet 12   No current facility-administered medications for this visit.     Allergies:  Allergies  Allergen Reactions  . Albuterol Other (See Comments)    High Heart Rate Jitters  . Azithromycin Other (See Comments)    Stomach cramps     ROS:  Const:    no fevers, chills Eyes:    conjunctiva clear, no vision changes or blurred vision ENT:  no hearing difficulties, no dysphagia, no dysphonia, no nose bleeds CV:   no chest pain, arrhythmias, no orthopnea, no PND Pulm:   no SOB at rest or exertion, no Wheeze, no DIB, no hemoptysis GI:    no  N/V/D/C, no abd pain GU:   no blood in urine or inc freq or urgency Heme/Onc:    no unexplained bleeding, no night sweats, no more fatigue than usual Neuro:   No dizziness, no LOC, No unexplained weakness or numbness Endo:   no unexplained wt loss or gain M-Sk:   no localized myalgias or arthralgias Psych:    No SI/HI, no memory prob or unexplained confusion    Objective:  Blood pressure 120/84, pulse 80, height 5' 3.5" (1.613 m), weight 114 lb 4.8 oz (51.8 kg), last menstrual period 06/21/2016. Body mass index is 19.93 kg/m.  Gen:   Well NAD, A and O *3 HEENT:    [place]/AT, EOMI,  MMM, OP- clr Lungs:   Normal work of breathing. CTA B/L, no Wh, rhonchi Heart:   RRR, S1, S2 WNL's, no MRG Abd:   Soft. No gross distention Exts:    warm, pink,  Brisk capillary refill, warm and well perfused.  Psych:    No HI/SI, judgement and insight good, Euthymic mood. Full Affect.   Recent Results (from the past 2160 hour(s))    CBC with Differential/Platelet     Status: None   Collection Time: 06/21/16  7:59 AM  Result Value Ref Range   WBC 6.2 3.8 - 10.8 K/uL   RBC 4.41 3.80 - 5.10 MIL/uL   Hemoglobin 13.0 11.7 - 15.5 g/dL   HCT 39.0 35.0 - 45.0 %   MCV 88.4 80.0 - 100.0 fL   MCH 29.5 27.0 - 33.0 pg   MCHC 33.3 32.0 - 36.0 g/dL   RDW 13.7 11.0 - 15.0 %   Platelets 245 140 - 400 K/uL   MPV 11.5 7.5 - 12.5 fL   Neutro Abs 3,100 1,500 - 7,800 cells/uL   Lymphs Abs 2,108 850 - 3,900 cells/uL   Monocytes Absolute 682 200 - 950 cells/uL   Eosinophils Absolute 248 15 - 500 cells/uL   Basophils Absolute 62 0 - 200 cells/uL   Neutrophils Relative % 50 %   Lymphocytes Relative 34 %   Monocytes Relative 11 %   Eosinophils Relative 4 %   Basophils Relative 1 %   Smear Review Criteria for review not met   COMPLETE METABOLIC PANEL WITH GFR     Status: None   Collection Time: 06/21/16  7:59 AM  Result Value Ref Range   Sodium 140 135 - 146 mmol/L   Potassium 4.7 3.5 - 5.3 mmol/L   Chloride 105 98 - 110 mmol/L   CO2 25 20 - 31 mmol/L   Glucose, Bld 83 65 - 99 mg/dL   BUN 15 7 - 25 mg/dL   Creat 0.81 0.50 - 1.10 mg/dL   Total Bilirubin 0.5 0.2 - 1.2 mg/dL   Alkaline Phosphatase 34 33 - 115 U/L   AST 17 10 - 30 U/L   ALT 12 6 - 29 U/L   Total Protein 6.7 6.1 - 8.1 g/dL   Albumin 4.0 3.6 - 5.1 g/dL   Calcium 9.4 8.6 - 10.2 mg/dL   GFR, Est African American >89 >=60 mL/min   GFR, Est Non African American >89 >=60 mL/min  Lipid panel     Status: None   Collection Time: 06/21/16  7:59 AM  Result Value Ref Range   Cholesterol 144 125 - 200 mg/dL   Triglycerides 65 <150 mg/dL   HDL 57 >=46 mg/dL   Total CHOL/HDL Ratio 2.5 <=5.0 Ratio   VLDL 13 <30 mg/dL   LDL  Cholesterol 74 <130 mg/dL    Comment:   Total Cholesterol/HDL Ratio:CHD Risk                        Coronary Heart Disease Risk Table                                        Men       Women          1/2 Average Risk              3.4        3.3               Average Risk              5.0        4.4           2X Average Risk              9.6        7.1           3X Average Risk             23.4       11.0 Use the calculated Patient Ratio above and the CHD Risk table  to determine the patient's CHD Risk.   TSH     Status: None   Collection Time: 06/21/16  7:59 AM  Result Value Ref Range   TSH 2.34 mIU/L    Comment:   Reference Range   > or = 20 Years  0.40-4.50   Pregnancy Range First trimester  0.26-2.66 Second trimester 0.55-2.73 Third trimester  0.43-2.91

## 2016-07-11 DIAGNOSIS — N946 Dysmenorrhea, unspecified: Secondary | ICD-10-CM | POA: Insufficient documentation

## 2016-07-11 DIAGNOSIS — Z516 Encounter for desensitization to allergens: Secondary | ICD-10-CM | POA: Insufficient documentation

## 2016-08-19 ENCOUNTER — Other Ambulatory Visit: Payer: Self-pay | Admitting: Obstetrics & Gynecology

## 2016-08-19 DIAGNOSIS — Z1231 Encounter for screening mammogram for malignant neoplasm of breast: Secondary | ICD-10-CM

## 2016-09-03 ENCOUNTER — Encounter: Payer: Self-pay | Admitting: Obstetrics & Gynecology

## 2016-09-22 ENCOUNTER — Ambulatory Visit
Admission: RE | Admit: 2016-09-22 | Discharge: 2016-09-22 | Disposition: A | Discharge status: Home / Self Care | Payer: BC Managed Care – PPO | Source: Ambulatory Visit | Attending: Obstetrics & Gynecology | Admitting: Obstetrics & Gynecology

## 2016-09-22 DIAGNOSIS — Z1231 Encounter for screening mammogram for malignant neoplasm of breast: Secondary | ICD-10-CM

## 2016-09-30 ENCOUNTER — Encounter: Payer: Self-pay | Admitting: Obstetrics & Gynecology

## 2016-09-30 ENCOUNTER — Ambulatory Visit: Payer: BC Managed Care – PPO | Admitting: Obstetrics & Gynecology

## 2016-09-30 VITALS — BP 116/60 | HR 76 | Resp 14 | Ht 63.75 in | Wt 111.0 lb

## 2016-09-30 DIAGNOSIS — Z01419 Encounter for gynecological examination (general) (routine) without abnormal findings: Secondary | ICD-10-CM | POA: Diagnosis not present

## 2016-09-30 DIAGNOSIS — Z0000 Encounter for general adult medical examination without abnormal findings: Secondary | ICD-10-CM | POA: Diagnosis not present

## 2016-09-30 LAB — POCT URINALYSIS DIPSTICK
BILIRUBIN UA: NEGATIVE
Blood, UA: NEGATIVE
GLUCOSE UA: NEGATIVE
LEUKOCYTES UA: NEGATIVE
NITRITE UA: NEGATIVE
Protein, UA: NEGATIVE
Urobilinogen, UA: NEGATIVE
pH, UA: 7

## 2016-09-30 MED ORDER — HYDROCORTISONE 2.5 % RE CREA: TOPICAL_CREAM | RECTAL | 1 refills | Status: DC

## 2016-09-30 MED ORDER — VALACYCLOVIR HCL 1 G PO TABS: 1000.0000 mg | ORAL_TABLET | Freq: Every day | ORAL | 2 refills | Status: DC

## 2016-09-30 NOTE — Progress Notes (Signed)
40 y.o. G0P0000 SingleCaucasianF here for annual exam.  Doing well.  Dr. Maple HudsonYoung told pt he is going to retire.  She has been receiving allergy shots "for years" but has weaned off them and using OTC products.  Using Zyrtec primarily.  Cycles are still regular.    Has noted some recent increase issues with a little red blood with BM.  Thinks she may have hemorrhoids.  Recent diarrhea caused the onset.  Patient's last menstrual period was 09/20/2016.          Sexually active: No.  The current method of family planning is abstinence.    Exercising: Yes.    walking, playing softball Smoker:  no  Health Maintenance: Pap:  06/16/15 negative  History of abnormal Pap:  no MMG:  09/25/16 BIRADS 1 negative  Colonoscopy:  never BMD:   n/a TDaP:  2015 Hep C testing: not indicated  Screening Labs: PCP, Hb today: PCP, Urine today: 1+ Ketones, others negative   reports that she has never smoked. She has never used smokeless tobacco. She reports that she does not drink alcohol or use drugs.  Past Medical History:  Diagnosis Date  . Allergic rhinitis   . Allergy   . Asthma   . Bronchitis    1-2 episodes yearly  . Environmental allergies    rash,swelling in throat,itching  . Headache(784.0)   . Laryngitis    2-3episodes yearly    Past Surgical History:  Procedure Laterality Date  . bulging disc C3 C4  2006  . EUS  10/13/2012   Procedure: UPPER ENDOSCOPIC ULTRASOUND (EUS) LINEAR;  Surgeon: Theda BelfastPatrick D Hung, MD;  Location: [facility];  Service: Endoscopy;  Laterality: N/A;  . NASAL SEPTUM SURGERY  11/01/2002  . UPPER GASTROINTESTINAL ENDOSCOPY  11/13; 12/13  . WISDOM TOOTH EXTRACTION      Current Outpatient Prescriptions  Medication Sig Dispense Refill  . cetirizine-pseudoephedrine (ZYRTEC-D) 5-120 MG tablet Take 1 tablet by mouth 2 (two) times daily. Take EVERY DAY not PRN 28 tablet 1  . EPINEPHrine 0.3 mg/0.3 mL IJ SOAJ injection For severe allergic reaction 1 Device prn  .  levalbuterol (XOPENEX HFA) 45 MCG/ACT inhaler Inhale 2 puffs into the lungs every 6 (six) hours as needed for wheezing. prn 1 Inhaler 12  . naproxen (NAPROSYN) 500 MG tablet Only take as needed for severe pain up to every 12 hrs 60 tablet 1  . NONFORMULARY OR COMPOUNDED ITEM Allergy Vaccine 1:10 Given at Home    . Polyethylene Glycol 3350 (MIRALAX PO) Take by mouth as needed.     . valACYclovir (VALTREX) 1000 MG tablet Take 1 tablet (1,000 mg total) by mouth 2 (two) times daily. 4 tablet 12   No current facility-administered medications for this visit.     Family History  Problem Relation Age of Onset  . Hypertension Father   . Diabetes Other   . Heart disease Other   . Cancer Maternal Grandfather     skin cancer  . Heart attack Maternal Grandfather   . Emphysema Maternal Grandfather   . Parkinsonism Paternal Grandmother   . Heart attack Paternal Grandfather   . Cancer Maternal Grandmother     ROS:  Pertinent items are noted in HPI.  Otherwise, a comprehensive ROS was negative.  Exam:   BP 116/60 (BP Location: Right Arm, Patient Position: Sitting, Cuff Size: Normal)   Pulse 76   Resp 14   Ht 5' 3.75" (1.619 m)   Wt 111 lb (50.3 kg)  LMP 09/20/2016   BMI 19.20 kg/m   Weight change: -3#  Height: 5' 3.75" (161.9 cm)  Ht Readings from Last 3 Encounters:  09/30/16 5' 3.75" (1.619 m)  07/08/16 5' 3.5" (1.613 m)  05/18/16 5' 3.5" (1.613 m)    General appearance: alert, cooperative and appears stated age Head: Normocephalic, without obvious abnormality, atraumatic Neck: no adenopathy, supple, symmetrical, trachea midline and thyroid normal to inspection and palpation Lungs: clear to auscultation bilaterally Breasts: normal appearance, no masses or tenderness Heart: regular rate and rhythm Abdomen: soft, non-tender; bowel sounds normal; no masses,  no organomegaly Extremities: extremities normal, atraumatic, no cyanosis or edema Skin: Skin color, texture, turgor normal. No  rashes or lesions Lymph nodes: Cervical, supraclavicular, and axillary nodes normal. No abnormal inguinal nodes palpated Neurologic: Grossly normal   Pelvic: External genitalia:  no lesions              Urethra:  normal appearing urethra with no masses, tenderness or lesions              Bartholins and Skenes: normal                 Vagina: normal appearing vagina with normal color and discharge, no lesions              Cervix: no lesions              Pap taken: No. Bimanual Exam:  Uterus:  normal size, contour, position, consistency, mobility, non-tender              Adnexa: normal adnexa and no mass, fullness, tenderness               Rectovaginal: Confirms               Anus:  normal sphincter tone, no lesions, hemorrhoid noted at 12 o'clock  Chaperone was present for exam.  A:   Well Woman with normal exam Not sexually active Grade 4 breast density HSV 1 Hemorrhoid  P:         Mammogram guidelines reviewed  Neg pap 8/16.  ASCUS pap with neg HR HPV 2016.  Labs UTD with Dr. Sharee Holsterpalski Rx for Valtrex and instructions provided.  #30/3RF. Rx for Anusol HC 2.5% #30/1RF. Return annually or prn

## 2016-09-30 NOTE — Patient Instructions (Signed)
OUR PHONE NUMBER IS [phone] You can reach one of our friendly administrative staff members by phone at the times listed below. We try to always answer the phone in person even during lunch hours: Monday - Thursday 8:00 - 7:00 Friday: 8:00 - 5:00 Our clinical treatment hours tend to be later than our live phone hours: (We are open from 8:00 am to 8:00 pm Monday - Thursday and 8:00 - 6:00 on Fridays.) You can leave a message on our answering system when we are closed either on weekdays or during the weekends. If you need to cancel an appointment, we request that you only contact us by phone. Fax Number:  [phone]   Address:  [address] [place], [place] [address]    Morrell RiddleNing Li, accupuncturist

## 2016-10-19 ENCOUNTER — Encounter: Payer: Self-pay | Admitting: Obstetrics & Gynecology

## 2016-10-20 ENCOUNTER — Telehealth: Payer: Self-pay | Admitting: Internal Medicine

## 2016-10-20 MED ORDER — AMOXICILLIN 500 MG PO TABS: 500.0000 mg | ORAL_TABLET | Freq: Two times a day (BID) | ORAL | 0 refills | Status: DC

## 2016-10-20 NOTE — Telephone Encounter (Signed)
Offer amoxacillin 500 mg, # 14, 1 twice daily  Saline sinus rinse and Sudafed if needed

## 2016-10-20 NOTE — Telephone Encounter (Signed)
Spoke with the pt and notified of recs per CDY  She verbalized understanding  Rx was sent to pharm  

## 2016-10-20 NOTE — Telephone Encounter (Signed)
Pt states her head feels really full, nasal drainage green in color scratchy throat & ears are stopped up X 2d. Pt feels she may have a slight fever, but has not checked it.  Pt would like some recommendations, as she feels she has a sinus infection.  CY please advise. Thanks.   Current Outpatient Prescriptions on File Prior to Visit  Medication Sig Dispense Refill  . cetirizine-pseudoephedrine (ZYRTEC-D) 5-120 MG tablet Take 1 tablet by mouth 2 (two) times daily. Take EVERY DAY not PRN 28 tablet 1  . EPINEPHrine 0.3 mg/0.3 mL IJ SOAJ injection For severe allergic reaction 1 Device prn  . hydrocortisone (ANUSOL-HC) 2.5 % rectal cream Can use up to QID, prn, but if persistent for more than 7 days, please be seen 28.35 g 1  . levalbuterol (XOPENEX HFA) 45 MCG/ACT inhaler Inhale 2 puffs into the lungs every 6 (six) hours as needed for wheezing. prn 1 Inhaler 12  . naproxen (NAPROSYN) 500 MG tablet Only take as needed for severe pain up to every 12 hrs 60 tablet 1  . NONFORMULARY OR COMPOUNDED ITEM Allergy Vaccine 1:10 Given at Home    . Polyethylene Glycol 3350 (MIRALAX PO) Take by mouth as needed.     . valACYclovir (VALTREX) 1000 MG tablet Take 1 tablet (1,000 mg total) by mouth daily. 30 tablet 2  . [DISCONTINUED] Cetirizine HCl (ZYRTEC PO) Take by mouth as needed.      No current facility-administered medications on file prior to visit.    Allergies  Allergen Reactions  . Albuterol Other (See Comments)    High Heart Rate Jitters  . Azithromycin Other (See Comments)    Stomach cramps

## 2017-01-13 ENCOUNTER — Ambulatory Visit: Payer: BC Managed Care – PPO | Admitting: Family Medicine

## 2017-03-10 ENCOUNTER — Ambulatory Visit (INDEPENDENT_AMBULATORY_CARE_PROVIDER_SITE_OTHER): Payer: BC Managed Care – PPO | Admitting: Family Medicine

## 2017-03-10 ENCOUNTER — Encounter: Payer: Self-pay | Admitting: Family Medicine

## 2017-03-10 VITALS — BP 111/74 | HR 68 | Ht 63.75 in | Wt 114.3 lb

## 2017-03-10 DIAGNOSIS — F439 Reaction to severe stress, unspecified: Secondary | ICD-10-CM | POA: Diagnosis not present

## 2017-03-10 DIAGNOSIS — M542 Cervicalgia: Secondary | ICD-10-CM | POA: Insufficient documentation

## 2017-03-10 DIAGNOSIS — M62838 Other muscle spasm: Secondary | ICD-10-CM | POA: Diagnosis not present

## 2017-03-10 DIAGNOSIS — J3089 Other allergic rhinitis: Secondary | ICD-10-CM

## 2017-03-10 MED ORDER — CYCLOBENZAPRINE HCL 10 MG PO TABS: 10.0000 mg | ORAL_TABLET | Freq: Three times a day (TID) | ORAL | 1 refills | Status: DC | PRN

## 2017-03-10 NOTE — Patient Instructions (Addendum)
N 95 masks- stop exposure.   Use flexeril 1/2-1 tab at night to start and if tolerated- not too sleepy- ok to take during the day.   Can take naprosyn for neck pain/ muscle spasms    Cervical Strain and Sprain Rehab Ask your health care provider which exercises are safe for you. Do exercises exactly as told by your health care provider and adjust them as directed. It is normal to feel mild stretching, pulling, tightness, or discomfort as you do these exercises, but you should stop right away if you feel sudden pain or your pain gets worse.Do not begin these exercises until told by your health care provider. Stretching and range of motion exercises These exercises warm up your muscles and joints and improve the movement and flexibility of your neck. These exercises also help to relieve pain, numbness, and tingling. Exercise A: Cervical side bend   1. Using good posture, sit on a stable chair or stand up. 2. Without moving your shoulders, slowly tilt your left / right ear to your shoulder until you feel a stretch in your neck muscles. You should be looking straight ahead. 3. Hold for __________ seconds. 4. Repeat with the other side of your neck. Repeat __________ times. Complete this exercise __________ times a day. Exercise B: Cervical rotation   1. Using good posture, sit on a stable chair or stand up. 2. Slowly turn your head to the side as if you are looking over your left / right shoulder.  Keep your eyes level with the ground.  Stop when you feel a stretch along the side and the back of your neck. 3. Hold for __________ seconds. 4. Repeat this by turning to your other side. Repeat __________ times. Complete this exercise __________ times a day. Exercise C: Thoracic extension and pectoral stretch  1. Roll a towel or a small blanket so it is about 4 inches (10 cm) in diameter. 2. Lie down on your back on a firm surface. 3. Put the towel lengthwise, under your spine in the middle  of your back. It should not be not under your shoulder blades. The towel should line up with your spine from your middle back to your lower back. 4. Put your hands behind your head and let your elbows fall out to your sides. 5. Hold for __________ seconds. Repeat __________ times. Complete this exercise __________ times a day. Strengthening exercises These exercises build strength and endurance in your neck. Endurance is the ability to use your muscles for a long time, even after your muscles get tired. Exercise D: Upper cervical flexion, isometric  1. Lie on your back with a thin pillow behind your head and a small rolled-up towel under your neck. 2. Gently tuck your chin toward your chest and nod your head down to look toward your feet. Do not lift your head off the pillow. 3. Hold for __________ seconds. 4. Release the tension slowly. Relax your neck muscles completely before you repeat this exercise. Repeat __________ times. Complete this exercise __________ times a day. Exercise E: Cervical extension, isometric   1. Stand about 6 inches (15 cm) away from a wall, with your back facing the wall. 2. Place a soft object, about 6-8 inches (15-20 cm) in diameter, between the back of your head and the wall. A soft object could be a small pillow, a ball, or a folded towel. 3. Gently tilt your head back and press into the soft object. Keep your jaw and forehead  relaxed. 4. Hold for __________ seconds. 5. Release the tension slowly. Relax your neck muscles completely before you repeat this exercise. Repeat __________ times. Complete this exercise __________ times a day. Posture and body mechanics   Body mechanics refers to the movements and positions of your body while you do your daily activities. Posture is part of body mechanics. Good posture and healthy body mechanics can help to relieve stress in your body's tissues and joints. Good posture means that your spine is in its natural S-curve  position (your spine is neutral), your shoulders are pulled back slightly, and your head is not tipped forward. The following are general guidelines for applying improved posture and body mechanics to your everyday activities. Standing   When standing, keep your spine neutral and keep your feet about hip-width apart. Keep a slight bend in your knees. Your ears, shoulders, and hips should line up.  When you do a task in which you stand in one place for a long time, place one foot up on a stable object that is 2-4 inches (5-10 cm) high, such as a footstool. This helps keep your spine neutral. Sitting    When sitting, keep your spine neutral and your keep feet flat on the floor. Use a footrest, if necessary, and keep your thighs parallel to the floor. Avoid rounding your shoulders, and avoid tilting your head forward.  When working at a desk or a computer, keep your desk at a height where your hands are slightly lower than your elbows. Slide your chair under your desk so you are close enough to maintain good posture.  When working at a computer, place your monitor at a height where you are looking straight ahead and you do not have to tilt your head forward or downward to look at the screen. Resting  When lying down and resting, avoid positions that are most painful for you. Try to support your neck in a neutral position. You can use a contour pillow or a small rolled-up towel. Your pillow should support your neck but not push on it. This information is not intended to replace advice given to you by your health care provider. Make sure you discuss any questions you have with your health care provider. Document Released: 10/18/2005 Document Revised: 06/24/2016 Document Reviewed: 09/24/2015 Elsevier Interactive Patient Education  2017 [employer].

## 2017-03-10 NOTE — Progress Notes (Signed)
Acute Care appt today:  I have not seen pt since 07/08/16.   Impression and Recommendations:    1. Cervicalgia   2. Muscle spasm   3. Environmental and seasonal allergies   4. Stress    Cervicalgia -  Plan: cyclobenzaprine (FLEXERIL) 10 MG tablet Can take OTC naprosyn for neck pain/ muscle spasms as well.  - Handouts provided on cervical strain rehabilitation exercises and stretches. - Patient will let me know if symptoms not improved or if they worsen.   Muscle spasm -  Plan: cyclobenzaprine (FLEXERIL) 10 MG tablet Use flexeril 1/2-1 tab at night to start and if tolerated- not too sleepy- ok to take during the day.   Environmental and seasonal allergies N 95 masks- stop exposure.  - More consistently do the sinus rinses.  Stress -Exercise, yoga, meditation, and other stress management techniques reviewed with patient. - She will let me know if the symptoms get worse and if she feels medications might be appropriate. - She has good support system and declines counseling at this time   The patient was counseled, risk factors were discussed, anticipatory guidance given.    Meds ordered this encounter  Medications  . cyclobenzaprine (FLEXERIL) 10 MG tablet    Sig: Take 1 tablet (10 mg total) by mouth 3 (three) times daily as needed for muscle spasms.    Dispense:  30 tablet    Refill:  1    Gross side effects, risk and benefits, and alternatives of medications and treatment plan in general discussed with patient.  Patient is aware that all medications have potential side effects and we are unable to predict every side effect or drug-drug interaction that may occur.   Patient will call with any questions prior to using medication if they have concerns.  Expresses verbal understanding and consents to current therapy and treatment regimen.  No barriers to understanding were identified.  Red flag symptoms and signs discussed in detail.  Patient expressed understanding  regarding what to do in case of emergency\urgent symptoms  Please see AVS handed out to patient at the end of our visit for further patient instructions/ counseling done pertaining to today's office visit.   Return in about 6 months (around 09/10/2017) for compete physical, come fasting.     Note: This document was prepared using Dragon voice recognition software and may include unintentional dictation errors.  Morgan Wood 11:52 AM --------------------------------------------------------------------------------------------------------------------------------------------------------------------------------------------------------------------------------------------    Subjective:    CC:  Chief Complaint  Patient presents with  . Follow-up    HPI: Morgan Wood is a 41 y.o. female who [facility] for issues as discussed below.  C/o acute on chronic Neck Pain:  h/o buldging discs and pinched nerve in neck.  Now neck pain has flared after recently moving furniture/ boxes at grandma's house.  No injury per se but was sore 1-2 days later.  No radiculopathy, loss of function or sensory in bilateral upper extremities.  Denies headache, visual loss, sz activity.  Seasonal allergies: Went off allergy shots - been off for one year now.    Doing well, taking zyrtec.   occ sinus rinses- can't tell if helps.   Stress:  A lot stress at work-   teaching;  For her stress mgt--> she is walking a little;  Mom and her occ do yoga but not regularly.  No suicidal or homicidal ideation.  Declines medications or a counselor today.  Wt Readings from Last 3 Encounters:  03/10/17 114 lb 4.8 oz (51.8 kg)  09/30/16 111 lb (50.3 kg)  07/08/16 114 lb 4.8 oz (51.8 kg)   BP Readings from Last 3 Encounters:  03/10/17 111/74  09/30/16 116/60  07/08/16 120/84   Pulse Readings from Last 3 Encounters:  03/10/17 68  09/30/16 76  07/08/16 80    BMI Readings from Last 3 Encounters:  03/10/17 19.77 kg/m  09/30/16 19.20 kg/m  07/08/16 19.93 kg/m     Patient Care Team    Relationship Specialty Notifications Start End  Thomasene Lot, DO PCP - General Family Medicine  05/17/16      Patient Active Problem List   Diagnosis Date Noted  . Muscle spasm 03/10/2017  . Cervicalgia 03/10/2017  . Dysmenorrhea 07/11/2016  . Goes for Allergy desensitization therapy 07/11/2016  . S/P correction of deviated nasal septum- chronic rhinitis and sinusitis 05/18/2016  . Environmental and seasonal allergies 05/18/2016  . h/o Abdominal pain, right upper quadrant 05/18/2016  . Neuropathy, cervical (radicular) 05/18/2016  . R shoulder tendinitis-  05/18/2016  . Constipation 06/16/2015  . Asthma, mild intermittent, well-controlled 04/29/2014  . Pain in right shin 05/03/2013  . Peroneal ganglion cyst 05/03/2013  . Chest pain 02/14/2013  . Allergic rhinitis due to pollen 03/14/2011    Past Medical history, Surgical history, Family history, Social history, Allergies and Medications have been entered into the medical record, reviewed and changed as needed.    Current Meds  Medication Sig  . cetirizine-pseudoephedrine (ZYRTEC-D) 5-120 MG tablet Take 1 tablet by mouth 2 (two) times daily. Take EVERY DAY not PRN  . levalbuterol (XOPENEX HFA) 45 MCG/ACT inhaler Inhale 2 puffs into the lungs every 6 (six) hours as needed for wheezing. prn  . naproxen (NAPROSYN) 500 MG tablet Only take as needed for severe pain up to every 12 hrs  . Polyethylene Glycol 3350 (MIRALAX PO) Take by mouth as needed.   . valACYclovir (VALTREX) 1000 MG tablet Take 1 tablet (1,000 mg total) by mouth daily.  . [DISCONTINUED] EPINEPHrine 0.3 mg/0.3 mL IJ SOAJ injection For severe allergic reaction    Allergies:  Allergies  Allergen Reactions  . Albuterol Other (See Comments)    High Heart Rate Jitters  . Azithromycin Other (See Comments)    Stomach cramps      Review of Systems: Review of Systems: General:   No F/C, wt loss Pulm:   No DIB, SOB, pleuritic chest pain Card:  No CP, palpitations Abd:  No n/v/d or pain Ext:  No inc edema from baseline   Objective:   Blood pressure 111/74, pulse 68, height 5' 3.75" (1.619 m), weight 114 lb 4.8 oz (51.8 kg), last menstrual period 03/01/2017. Body mass index is 19.77 kg/m. General:  Well Developed, well nourished, appropriate for stated age.  Neuro:  Alert and oriented,  extra-ocular muscles intact  HEENT:  Normocephalic, atraumatic, neck supple Skin:  no gross rash, warm, pink. Cardiac:  RRR, S1 S2 Respiratory:  ECTA B/L and A/P, Not using accessory muscles, speaking in full sentences- unlabored. M-SK Neck:  Palpable muscle spasms in the paravertebral cervical spine region.  Range of motion-within normal limits.  No spinous process or bony tenderness to palpation.  Muscle spasms appreciated in upper traps and splenius capitis muscles bilaterally right greater than left.   Vascular:  Ext warm, no cyanosis apprec.; cap RF less 2 sec. Psych:  No HI/SI, judgement and insight good, Euthymic mood. Full Affect.

## 2017-06-14 ENCOUNTER — Telehealth: Payer: Self-pay | Admitting: Family Medicine

## 2017-06-14 NOTE — Telephone Encounter (Signed)
Patient called states was bitten by mosquitos on Saturday and they are very itchy & swelling, pt has tried OTC remedies but nothing working to curb irritation-- Pt ask nurse or provider call her back with any suggestions.

## 2017-09-13 ENCOUNTER — Encounter: Payer: Self-pay | Admitting: Pulmonary Disease

## 2017-09-13 ENCOUNTER — Telehealth: Payer: Self-pay | Admitting: Internal Medicine

## 2017-09-13 ENCOUNTER — Ambulatory Visit: Payer: BC Managed Care – PPO | Admitting: Pulmonary Disease

## 2017-09-13 ENCOUNTER — Telehealth: Payer: Self-pay | Admitting: Pulmonary Disease

## 2017-09-13 DIAGNOSIS — J301 Allergic rhinitis due to pollen: Secondary | ICD-10-CM

## 2017-09-13 DIAGNOSIS — J4520 Mild intermittent asthma, uncomplicated: Secondary | ICD-10-CM | POA: Diagnosis not present

## 2017-09-13 MED ORDER — LEVALBUTEROL TARTRATE 45 MCG/ACT IN AERO: 2.0000 | INHALATION_SPRAY | Freq: Four times a day (QID) | RESPIRATORY_TRACT | 12 refills | Status: DC | PRN

## 2017-09-13 MED ORDER — METHYLPREDNISOLONE 4 MG PO TBPK: ORAL_TABLET | ORAL | 0 refills | Status: DC

## 2017-09-13 NOTE — Telephone Encounter (Signed)
Patient was seen today by RA for chest tightness. He wants her to follow up in 4 weeks with CY. CY does not have any open appointments.   Katie, please advise on if the patient can be worked in. Thanks!

## 2017-09-13 NOTE — Progress Notes (Signed)
   Subjective:    Patient ID: Morgan Wood, female    DOB: 07/04/1976, 41 y.o.   MRN: [id number]  HPI 41 yo never smoker followed for Rhinosinusitis, asthmatic bronchitis    She is a second grade school teacher.  For the last week she has been having chest tightness, relates this to weather changes.  She stopped taking allergy shots a year ago after being on it for 20 years.  She played ball outside and needed to use her Xopenex inhaler and wonders if this triggered it. She does have an allergy to cats and stays away from them. Otherwise denies URI symptoms.  She has a dry cough but her predominant symptom is chest tightness, no wheezing or nocturnal symptoms  Denies sinus drip or GERD  Past Medical History:  Diagnosis Date  . Allergic rhinitis   . Allergy   . Asthma   . Bronchitis    1-2 episodes yearly  . Environmental allergies    rash,swelling in throat,itching  . Headache(784.0)   . Laryngitis    2-3episodes yearly         Review of Systems  neg for any significant sore throat, dysphagia, itching, sneezing, nasal congestion or excess/ purulent secretions, fever, chills, sweats, unintended wt loss, pleuritic or exertional cp, hempoptysis, orthopnea pnd or change in chronic leg swelling.  Also denies presyncope, palpitations, heartburn, abdominal pain, nausea, vomiting, diarrhea or change in bowel or urinary habits, dysuria,hematuria, rash, arthralgias, visual complaints, headache, numbness weakness or ataxia.     Objective:   Physical Exam  Gen. Pleasant, thin , in no distress ENT - no thrush, no post nasal drip Neck: No JVD, no thyromegaly, no carotid bruits Lungs: no use of accessory muscles, no dullness to percussion, clear without rales or rhonchi  Cardiovascular: Rhythm regular, heart sounds  normal, no murmurs or gallops, no peripheral edema Musculoskeletal: No deformities, no cyanosis or clubbing        Assessment & Plan:

## 2017-09-13 NOTE — Assessment & Plan Note (Signed)
Continue Zyrtec. I have asked her to get back with Dr. Maple HudsonYoung to discuss whether she needs allergy vaccine again long-term

## 2017-09-13 NOTE — Assessment & Plan Note (Signed)
Prednisone dosepak Refills on xopenex  This exacerbation is very likely related to weather change Overall symptoms appear to be mild intermittent and does not seem to require inhaled steroids

## 2017-09-13 NOTE — Patient Instructions (Signed)
Prednisone dosepak Refills on xopenex

## 2017-09-13 NOTE — Telephone Encounter (Signed)
Pt c/o increased chest tightness X1 week.  Also notes some wheezing. Denies mucus production, sharp chest pains, fever, chills.  Requesting to be seen today by any provider.  Pt used to see CY (last seen 03/2016) but did not follow up d/t being under the impression that CY was retiring. Pt scheduled to see RA at 4:15 at pt request.  Nothing further needed.

## 2017-09-19 ENCOUNTER — Encounter: Payer: Self-pay | Admitting: Family Medicine

## 2017-09-19 ENCOUNTER — Ambulatory Visit: Payer: BC Managed Care – PPO | Admitting: Family Medicine

## 2017-09-19 VITALS — BP 123/83 | HR 70 | Temp 98.4°F | Wt 115.0 lb

## 2017-09-19 DIAGNOSIS — F410 Panic disorder [episodic paroxysmal anxiety] without agoraphobia: Secondary | ICD-10-CM | POA: Insufficient documentation

## 2017-09-19 DIAGNOSIS — J329 Chronic sinusitis, unspecified: Secondary | ICD-10-CM | POA: Diagnosis not present

## 2017-09-19 DIAGNOSIS — J4520 Mild intermittent asthma, uncomplicated: Secondary | ICD-10-CM | POA: Diagnosis not present

## 2017-09-19 DIAGNOSIS — R0982 Postnasal drip: Secondary | ICD-10-CM | POA: Diagnosis not present

## 2017-09-19 DIAGNOSIS — F411 Generalized anxiety disorder: Secondary | ICD-10-CM

## 2017-09-19 DIAGNOSIS — J069 Acute upper respiratory infection, unspecified: Secondary | ICD-10-CM | POA: Diagnosis not present

## 2017-09-19 DIAGNOSIS — F430 Acute stress reaction: Secondary | ICD-10-CM | POA: Insufficient documentation

## 2017-09-19 DIAGNOSIS — H6981 Other specified disorders of Eustachian tube, right ear: Secondary | ICD-10-CM | POA: Diagnosis not present

## 2017-09-19 DIAGNOSIS — B9789 Other viral agents as the cause of diseases classified elsewhere: Secondary | ICD-10-CM

## 2017-09-19 DIAGNOSIS — J310 Chronic rhinitis: Secondary | ICD-10-CM

## 2017-09-19 MED ORDER — FLUTICASONE PROPIONATE 50 MCG/ACT NA SUSP: 2.0000 | Freq: Every day | NASAL | 6 refills | Status: DC

## 2017-09-19 MED ORDER — HYDROCOD POLST-CPM POLST ER 10-8 MG/5ML PO SUER: 5.0000 mL | Freq: Two times a day (BID) | ORAL | 0 refills | Status: DC | PRN

## 2017-09-19 MED ORDER — FLUOXETINE HCL 10 MG PO TABS: ORAL_TABLET | ORAL | 0 refills | Status: DC

## 2017-09-19 MED ORDER — BUSPIRONE HCL 15 MG PO TABS: 15.0000 mg | ORAL_TABLET | Freq: Three times a day (TID) | ORAL | 3 refills | Status: DC

## 2017-09-19 NOTE — Progress Notes (Signed)
Acute Care Office visit  Assessment and plan:  1. Viral URI with cough   2. Postnasal drip   3. Rhinosinusitis   4. Dysfunction of right eustachian tube   5. GAD (generalized anxiety disorder)   6. Acute reaction to stress   7. Panic anxiety syndrome   8. Asthma, mild intermittent, well-controlled        Education and routine counseling performed. Handouts provided.   Anticipatory guidance and routine counseling done re: condition, txmnt options and need for follow up. All questions of patient's were answered.  - Viral vs Allergic vs Bacterial causes for pt's symptoms reveiwed.    - Supportive care and various OTC medications discussed in addition to any prescribed. - Call or RTC if new symptoms, or if no improvement or worse over next couple days.   - Will consider ABX at that time if sx continue past 7-10 days and worsening.    Gross side effects, risk and benefits, and alternatives of medications discussed with patient.  Patient is aware that all medications have potential side effects and we are unable to predict every sideeffect or drug-drug interaction that may occur.  Expresses verbal understanding and consents to current therapy plan and treatment regiment.  Return for 4-6wks, follow-up anxiety, started fluoxetine and buspirone.  Please see AVS handed out to patient at the end of our visit for additional patient instructions/ counseling done pertaining to today's office visit.  Note: This document was prepared using Dragon voice recognition software and may include unintentional dictation errors.    Subjective:    Chief Complaint  Patient presents with  . Cough    cough (productive at times), runny noae, post nasal drainage, sore throat x 1 wk     HPI:  Pt presents with Sx for 6 days   C/o: Patient was seen by her pulmonologist last Wednesday.  He evaluated her and did not think she needed chest x-ray she was given a prednisone taper for 60 mg a day, 50 mg  a day etc.  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  Her kids are "driving her nuts.  -She denies any fevers or chills, she has some fullness in her right ear but otherwise sinus congestion, runny nose, postnasal drip and some sore throat.  She does have cough due to postnasal drip especially at night but otherwise no pain with coughing.. After the prednisone pack her shortness of breath and wheeze has improved but she does feel tight like something sitting on her chest which is worse when she is emotionally distraught.  She tells me today that we discussed her going on and anxiolytic in the past and she would like to do so this time. -She is sleeping without any difficulties. -She has not been on any mood medicines in the past.  Denies:  objective F/C,   No face pain or ear pain,   No N/V/D,   No SOB/DIB, no Cough or  Pleuritic CP,  No Rash.    For symptoms patient has tried:    Overall getting:     Problem  Gad (Generalized Anxiety Disorder)  Acute Reaction to Stress  Panic Anxiety Syndrome     Past medical history, Surgical history, Family history reviewed and noted below, Social history, Allergies, and Medications have been entered into the medical record, reviewed and changed as needed.  Allergies  Allergen Reactions  . Albuterol Other (See Comments)    High Heart Rate Jitters  . Azithromycin Other (See Comments)    Stomach cramps    Review of Systems: General:   No F/C, wt loss Pulm:   No DIB, pleuritic chest pain Card:  No CP, palpitations Abd:  No n/v/d or pain Ext:  No inc edema from baseline   Objective:   Blood pressure 123/83, pulse 70, temperature 98.4 F (36.9 C), weight 115 lb (52.2 kg). Body mass index is 19.89 kg/m. General: Well Developed, well nourished, appropriate for stated age.  Neuro: Alert and oriented x3, extra-ocular muscles  intact, sensation grossly intact.  HEENT: Normocephalic, atraumatic, pupils equal round reactive to light, neck supple, no masses, no painful lymphadenopathy, TM's intact B/L, no acute findings. Nares- patent, clear d/c, OP- clear, mild erythema, No TTP sinuses Skin: Warm and dry, no gross rash. Cardiac: RRR, S1 S2,  no murmurs rubs or gallops.  Respiratory: ECTA B/L and A/P, Not using accessory muscles, speaking in full sentences- unlabored. Vascular:  No gross lower ext edema, cap RF less 2 sec. Psych: No HI/SI, judgement and insight good, Euthymic mood. Full Affect.   Patient Care Team    Relationship Specialty Notifications Start End  Thomasene Lotpalski, Nakima Fluegge, DO PCP - General Family Medicine  05/17/16

## 2017-09-19 NOTE — Patient Instructions (Addendum)
-  Please use over-the-counter sinus rinse such as a Y are Neomed and do it twice a day followed by 1 spray each nostril Flonase twice daily.  Also feel free to continue taking her Zyrtec-D.  Take the Tussionex as needed for cough.  If you develop fever chills and one-sided face pain, please call us and let us know as you may need antibiotics.  Also take your albuterol as needed for shortness of breath.  However, I think the majority of this is coming from your anxiety not from your asthma as you are totally clear on exam today.   Most colds last 7-10 days and you slowly improved but if worsening by that time, please call us for antibiotics.  -As I discussed with the BuSpar is going to take 6-8 weeks to kick in so please continue to take and be patient.  Start the fluoxetine 10 mg daily tonight or tomorrow.  I would recommend you take after food.  If after 1-2 weeks you are not feeling any different go ahead and take double the dose to 20 mg daily.  I will see her back in 4-6 weeks.  I want you to do Cooks Hook-up and square breathing 4-5 times per day.  10 square breathing each time.    -Also we need to transition your brain into thinking more positively.  These tasks below are some things I want you to do every day 1)  write 3 new things that you are grateful for every day for 21 days  2)  exercise daily- walk for 15 minutes twice a day every day 3)  you are going to journal every day about one positive experience that you had 4)  meditate every day.  You can go on YouTube and look for 15-minute relaxation meditation or what ever.  But we need to make sure that you are in the moment and relaxing and deep breathing every day 5)  Write 1 positive email every day to praise someone in your life

## 2017-09-20 ENCOUNTER — Telehealth: Payer: Self-pay | Admitting: Family Medicine

## 2017-09-20 NOTE — Telephone Encounter (Signed)
Patient called states she was told by Dr. Val Eagle to call office if head pain from head cold/ sinus worsened-- Pt states it is worse today then  Yesterday 09/19/17-- Pls call in the Rx to [facility] on [address] --glh

## 2017-09-21 ENCOUNTER — Telehealth: Payer: Self-pay

## 2017-09-21 MED ORDER — AMOXICILLIN-POT CLAVULANATE 875-125 MG PO TABS: 1.0000 | ORAL_TABLET | Freq: Two times a day (BID) | ORAL | 0 refills | Status: DC

## 2017-09-21 NOTE — Telephone Encounter (Signed)
Spoke with Dr. Sharee Holsterpalski, she approved sending in Augmentin for the patient, called the patient to notify left message. MPulliam, CMA/RT(R)

## 2017-09-21 NOTE — Telephone Encounter (Signed)
Patient called and states that symptoms are worse. Per Dr. Sharee Holsterpalski sent in RX for Augmentin. MPulliam, CMA/RT(R)

## 2017-10-01 ENCOUNTER — Encounter: Payer: Self-pay | Admitting: Osteopathic Medicine

## 2017-10-01 ENCOUNTER — Other Ambulatory Visit: Payer: Self-pay

## 2017-10-01 ENCOUNTER — Ambulatory Visit (INDEPENDENT_AMBULATORY_CARE_PROVIDER_SITE_OTHER): Payer: BC Managed Care – PPO

## 2017-10-01 ENCOUNTER — Ambulatory Visit (INDEPENDENT_AMBULATORY_CARE_PROVIDER_SITE_OTHER): Payer: BC Managed Care – PPO | Admitting: Osteopathic Medicine

## 2017-10-01 VITALS — BP 112/82 | HR 97 | Temp 98.2°F | Resp 16 | Ht 63.75 in | Wt 111.3 lb

## 2017-10-01 DIAGNOSIS — J069 Acute upper respiratory infection, unspecified: Secondary | ICD-10-CM | POA: Diagnosis not present

## 2017-10-01 DIAGNOSIS — I4510 Unspecified right bundle-branch block: Secondary | ICD-10-CM | POA: Diagnosis not present

## 2017-10-01 DIAGNOSIS — R0789 Other chest pain: Secondary | ICD-10-CM

## 2017-10-01 DIAGNOSIS — J4520 Mild intermittent asthma, uncomplicated: Secondary | ICD-10-CM | POA: Diagnosis not present

## 2017-10-01 DIAGNOSIS — I444 Left anterior fascicular block: Secondary | ICD-10-CM | POA: Insufficient documentation

## 2017-10-01 MED ORDER — FLUTICASONE FUROATE 50 MCG/ACT IN AEPB: 2.0000 | INHALATION_SPRAY | Freq: Every day | RESPIRATORY_TRACT | 1 refills | Status: DC

## 2017-10-01 NOTE — Patient Instructions (Addendum)
At this point, chest Xray looks ok. Treatment for mild asthma hasn't helped. I don't think this is a sinusitis or pneumonia. I think this is either an anxiety problem, under-treated asthma, possibly an esophageal or stomach problem, or less likely a heart issue.   I'm going to start an inhaler that might help reduce lung over-reactivity and inflammation. Please be sure to keep your follow up with your pulmonologist.   The EKG showed a slightly abnormal rhythm that is often seen in healthy patients and doesn't usually cause any problems or symptoms, but given your persistent chest discomfort I think we should proceed with an ultrasound of the heart and a cardiology referral just to be safe. For severe chest pain or shortness of breath, please go to the nearest emergency room.     IF you received an x-ray today, you will receive an invoice from [facility]. Please contact [facility] at [phone] with questions or concerns regarding your invoice.   IF you received labwork today, you will receive an invoice from [facility]. Please contact [facility] at [phone] with questions or concerns regarding your invoice.   Our billing staff will not be able to assist you with questions regarding bills from these companies.  You will be contacted with the lab results as soon as they are available. The fastest way to get your results is to activate your My Chart account. Instructions are located on the last page of this paperwork. If you have not heard from us regarding the results in 2 weeks, please contact this office.

## 2017-10-01 NOTE — Progress Notes (Signed)
HPI: Morgan Wood is a 41 y.o. female with has a past medical history of Allergic rhinitis, Allergy, Asthma, Bronchitis, Environmental allergies, Headache(784.0), and Laryngitis.  who presents to Primary Care at [facility] today, 10/01/17,  for chief complaint of:  Chief Complaint  Patient presents with  . URI    hoarseness, chest tightness, abdominal discomfort, head congestion, non productive cough, right ear pain     . Context: (+)asthmatic, persistent chest tightness, has seen PCP and pulmonologist in the past few weeks  . Location/Quality: chest tightness below L breast, in mid-back, R ear fullness/pain, . Duration: 3 weeks . Timing: constant, nothing makes better or worse.  . Modifying factors: has been treated with steroids, amoxicillin. Has been using rescue inhaler, is not on maintenance inhaler  . Assoc signs/symptoms: sore throat, sinus drainage/congestion, GERD, anxiety. No fever/chills. No substernal chest pain, no dizziness.   09/19/17: PCP, Dr Pricilla Handler Viral vs allergic vs bacterial. Started fluoxetine and buspirone - pt reports panic/anxiety and increased stress at work. Rx tussionex as well. Discussed OTC meds. She reviewed pulm notes as well, see below. Pt called back next day reporting worsening sinus symptoms so augmentin was sent. Advised f/u 4-6 week to recheck anxiety.   09/13/17: Pulm, Dr. Elsworth Soho  Symptoms likely related to weather change, didn't think ICS warranted, Rx prednisone dosepak and refilled Xopenex, advised RTC 4 weeks   Past medical, surgical, social and family history reviewed:  Patient Active Problem List   Diagnosis Date Noted  . GAD (generalized anxiety disorder) 09/19/2017  . Acute reaction to stress 09/19/2017  . Panic anxiety syndrome 09/19/2017  . Muscle spasm 03/10/2017  . Cervicalgia 03/10/2017  . Dysmenorrhea 07/11/2016  . Goes for Allergy desensitization therapy 07/11/2016  . S/P correction of deviated nasal septum- chronic rhinitis and  sinusitis 05/18/2016  . Environmental and seasonal allergies 05/18/2016  . h/o Abdominal pain, right upper quadrant 05/18/2016  . Neuropathy, cervical (radicular) 05/18/2016  . R shoulder tendinitis-  05/18/2016  . Constipation 06/16/2015  . Asthma, mild intermittent, well-controlled 04/29/2014  . Pain in right shin 05/03/2013  . Peroneal ganglion cyst 05/03/2013  . Chest pain 02/14/2013  . Allergic rhinitis due to pollen 03/14/2011    Past Surgical History:  Procedure Laterality Date  . bulging disc C3 C4  2006  . EUS  10/13/2012   Procedure: UPPER ENDOSCOPIC ULTRASOUND (EUS) LINEAR;  Surgeon: Beryle Beams, MD;  Location: [facility];  Service: Endoscopy;  Laterality: N/A;  . NASAL SEPTUM SURGERY  11/01/2002  . UPPER GASTROINTESTINAL ENDOSCOPY  11/13; 12/13  . WISDOM TOOTH EXTRACTION      Social History   Tobacco Use  . Smoking status: Never Smoker  . Smokeless tobacco: Never Used  Substance Use Topics  . Alcohol use: No    Family History  Problem Relation Age of Onset  . Hypertension Father   . Diabetes Other   . Heart disease Other   . Cancer Maternal Grandfather        skin cancer  . Heart attack Maternal Grandfather   . Emphysema Maternal Grandfather   . Parkinsonism Paternal Grandmother   . Heart attack Paternal Grandfather   . Cancer Maternal Grandmother      Current medication list and allergy/intolerance information reviewed:    Current Outpatient Medications  Medication Sig Dispense Refill  . cetirizine-pseudoephedrine (ZYRTEC-D) 5-120 MG tablet Take 1 tablet by mouth 2 (two) times daily. Take EVERY DAY not PRN 28 tablet 1  . FLUoxetine (PROZAC) 10  MG tablet Use one half tablet daily for 1 week then increase to 1 tablet daily 90 tablet 0  . fluticasone (FLONASE) 50 MCG/ACT nasal spray Place 2 sprays daily into both nostrils. 16 g 6  . levalbuterol (XOPENEX HFA) 45 MCG/ACT inhaler Inhale 2 puffs every 6 (six) hours as needed into the lungs for wheezing.  prn 1 Inhaler 12  . Multiple Vitamin (MULTIVITAMIN) capsule Take 1 capsule daily by mouth.    . valACYclovir (VALTREX) 1000 MG tablet Take 1 tablet (1,000 mg total) by mouth daily. 30 tablet 2  . amoxicillin-clavulanate (AUGMENTIN) 875-125 MG tablet Take 1 tablet by mouth 2 (two) times daily. For 10 days (Patient not taking: Reported on 10/01/2017) 20 tablet 0  . busPIRone (BUSPAR) 15 MG tablet Take 1 tablet (15 mg total) 3 (three) times daily by mouth. (Patient not taking: Reported on 10/01/2017) 90 tablet 3  . chlorpheniramine-HYDROcodone (TUSSIONEX) 10-8 MG/5ML SUER Take 5 mLs every 12 (twelve) hours as needed by mouth for cough (cough, will cause drowsiness.). (Patient not taking: Reported on 10/01/2017) 120 mL 0  . methylPREDNISolone (MEDROL DOSEPAK) 4 MG TBPK tablet follow package directions (Patient not taking: Reported on 10/01/2017) 21 tablet 0   No current facility-administered medications for this visit.     Allergies  Allergen Reactions  . Albuterol Other (See Comments)    High Heart Rate Jitters  . Azithromycin Other (See Comments)    Stomach cramps      Review of Systems:  Constitutional:  No  fever, no chills, +recent illness, No unintentional weight changes.   HEENT: No  headache, no vision change, no hearing change, No sore throat, +sinus pressure  Cardiac: No  chest pain, +pressure, No palpitations, No  Orthopnea, +chest tightness  Respiratory:  +shortness of breath. +Cough  Gastrointestinal: +epigastric abdominal pain, No  nausea, No  vomiting,  No  blood in stool, No  diarrhea, No  constipation   Musculoskeletal: No new myalgia/arthralgia  Skin: No  Rash,  Neurologic: No  weakness, No  dizziness,  Psychiatric: No  concerns with depression, +concerns with anxiety, No sleep problems, No mood problems  Exam:  BP 112/82   Pulse 97   Temp 98.2 F (36.8 C)   Resp 16   Ht 5' 3.75" (1.619 m)   Wt 111 lb 4.8 oz (50.5 kg)   SpO2 99%   BMI 19.25 kg/m    Constitutional: VS see above. General Appearance: alert, well-developed, well-nourished, NAD  Eyes: Normal lids and conjunctive, non-icteric sclera  Ears, Nose, Mouth, Throat: MMM, Normal external inspection ears/nares/mouth/lips/gums. TM normal bilaterally. Pharynx/tonsils no erythema, no exudate. Nasal mucosa normal.   Neck: No masses, trachea midline. No thyroid enlargement. No tenderness/mass appreciated. No lymphadenopathy  Respiratory: Normal respiratory effort. no wheeze, no rhonchi, no rales  Cardiovascular: S1/S2 normal, no murmur, no rub/gallop auscultated. RRR. No lower extremity edema.   Gastrointestinal: Nontender, no masses.   Musculoskeletal: Gait normal. No clubbing/cyanosis of digits.   Neurological: Normal balance/coordination. No tremor.   Skin: warm, dry, intact. No rash/ulcer. No concerning nevi or subq nodules on limited exam.    Psychiatric: Normal judgment/insight. Anxious mood and affect. Oriented x3.    No results found for this or any previous visit (from the past 72 hour(s)).    Dg Chest 2 View  Result Date: 10/01/2017 CLINICAL DATA:  Asthma, cough, congestion EXAM: CHEST  2 VIEW COMPARISON:  10/24/2013 FINDINGS: Heart and mediastinal contours are within normal limits. No focal opacities or effusions.  No acute bony abnormality. IMPRESSION: No active cardiopulmonary disease. Electronically Signed   By: Rolm Baptise M.D.   On: 10/01/2017 09:46   EKG interpretation: Rate: 72 Rhythm: sinus No ST/T changes concerning for acute ischemia/infarct  Previous EKG none available (+)IRBBB, LAFB    ASSESSMENT/PLAN:   Asthma, mild intermittent, well-controlled - I'm not convinced ICS will be of any great help but given persistent chest tightness it's worth a try and can cancel cradio w/u if she feels better on this.  - Plan: DG Chest 2 View, Fluticasone Furoate (ARNUITY ELLIPTA) 50 MCG/ACT AEPB  URI with cough and congestion - Plan: DG Chest 2  View  Sensation of chest tightness - consider GI w/u as well.  - Plan: EKG, DG Chest 2 View, EKG, CBC, TSH, Magnesium, ECHOCARDIOGRAM COMPLETE, CMP14+EGFR, Ambulatory referral to Cardiology, CANCELED: COMPLETE METABOLIC PANEL WITH GFR  Incomplete RBBB - possible incidental finding but given anxiety will go ahead and refer to cardiology and get Echo as well   Left anterior fascicular block (LAFB) - possible incidental finding but given anxiety will go ahead and refer to cardiology and get Echo as well      Patient Instructions   At this point, chest Xray looks ok. Treatment for mild asthma hasn't helped. I don't think this is a sinusitis or pneumonia. I think this is either an anxiety problem, under-treated asthma, possibly an esophageal or stomach problem, or less likely a heart issue.   I'm going to start an inhaler that might help reduce lung over-reactivity and inflammation. Please be sure to keep your follow up with your pulmonologist.   The EKG showed a slightly abnormal rhythm that is often seen in healthy patients and doesn't usually cause any problems or symptoms, but given your persistent chest discomfort I think we should proceed with an ultrasound of the heart and a cardiology referral just to be safe. For severe chest pain or shortness of breath, please go to the nearest emergency room.     IF you received an x-ray today, you will receive an invoice from [facility]. Please contact [facility] at [phone] with questions or concerns regarding your invoice.   IF you received labwork today, you will receive an invoice from [facility]. Please contact [facility] at [phone] with questions or concerns regarding your invoice.   Our billing staff will not be able to assist you with questions regarding bills from these companies.  You will be contacted with the lab results as soon as they are available. The fastest way to get your results is to activate your My  Chart account. Instructions are located on the last page of this paperwork. If you have not heard from [place] regarding the results in 2 weeks, please contact this office.         Visit summary with medication list and pertinent instructions was printed for patient to review. All questions at time of visit were answered - patient instructed to contact office with any additional concerns. ER/RTC precautions were reviewed with the patient. Follow-up plan: Return for recheck as directed with your pulmonologist and PCP, urgent care or ER if needed .  Note: Total time spent 40 minutes, greater than 50% of the visit was spent face-to-face counseling and coordinating care for the following: The primary encounter diagnosis was Asthma, mild intermittent, well-controlled. Diagnoses of URI with cough and congestion, Sensation of chest tightness, Incomplete RBBB, and Left anterior fascicular block (LAFB) were also pertinent to this visit.Marland Kitchen  Please note:  voice recognition software was used to produce this document, and typos may escape review. Please contact me for any needed clarifications.

## 2017-10-02 LAB — CMP14+EGFR
A/G RATIO: 1.7 (ref 1.2–2.2)
ALK PHOS: 47 IU/L (ref 39–117)
ALT: 16 IU/L (ref 0–32)
AST: 20 IU/L (ref 0–40)
Albumin: 4.6 g/dL (ref 3.5–5.5)
BUN/Creatinine Ratio: 18 (ref 9–23)
BUN: 15 mg/dL (ref 6–24)
Bilirubin Total: 0.5 mg/dL (ref 0.0–1.2)
CALCIUM: 9.9 mg/dL (ref 8.7–10.2)
CO2: 21 mmol/L (ref 20–29)
CREATININE: 0.83 mg/dL (ref 0.57–1.00)
Chloride: 107 mmol/L — ABNORMAL HIGH (ref 96–106)
GFR calc Af Amer: 101 mL/min/{1.73_m2} (ref >59)
GFR calc non Af Amer: 87 mL/min/{1.73_m2} (ref >59)
GLOBULIN, TOTAL: 2.7 g/dL (ref 1.5–4.5)
Glucose: 86 mg/dL (ref 65–99)
POTASSIUM: 4.8 mmol/L (ref 3.5–5.2)
SODIUM: 142 mmol/L (ref 134–144)
Total Protein: 7.3 g/dL (ref 6.0–8.5)

## 2017-10-02 LAB — CBC
Hematocrit: 41.5 % (ref 34.0–46.6)
Hemoglobin: 14.2 g/dL (ref 11.1–15.9)
MCH: 30.3 pg (ref 26.6–33.0)
MCHC: 34.2 g/dL (ref 31.5–35.7)
MCV: 89 fL (ref 79–97)
PLATELETS: 285 10*3/uL (ref 150–379)
RBC: 4.69 x10E6/uL (ref 3.77–5.28)
RDW: 13.9 % (ref 12.3–15.4)
WBC: 9.1 10*3/uL (ref 3.4–10.8)

## 2017-10-02 LAB — MAGNESIUM: Magnesium: 1.8 mg/dL (ref 1.6–2.3)

## 2017-10-02 LAB — TSH: TSH: 1.41 u[IU]/mL (ref 0.450–4.500)

## 2017-10-04 ENCOUNTER — Ambulatory Visit (HOSPITAL_COMMUNITY): Payer: BC Managed Care – PPO | Attending: Cardiology

## 2017-10-04 ENCOUNTER — Other Ambulatory Visit (HOSPITAL_COMMUNITY): Payer: BC Managed Care – PPO

## 2017-10-04 ENCOUNTER — Other Ambulatory Visit: Payer: Self-pay

## 2017-10-04 DIAGNOSIS — R0789 Other chest pain: Secondary | ICD-10-CM | POA: Diagnosis not present

## 2017-10-24 ENCOUNTER — Ambulatory Visit: Payer: BC Managed Care – PPO | Admitting: Internal Medicine

## 2017-10-31 ENCOUNTER — Ambulatory Visit: Payer: BC Managed Care – PPO | Admitting: Internal Medicine

## 2017-11-02 ENCOUNTER — Encounter: Payer: Self-pay | Admitting: Internal Medicine

## 2017-11-02 ENCOUNTER — Ambulatory Visit: Payer: BC Managed Care – PPO | Admitting: Internal Medicine

## 2017-11-02 DIAGNOSIS — J301 Allergic rhinitis due to pollen: Secondary | ICD-10-CM

## 2017-11-02 DIAGNOSIS — J4520 Mild intermittent asthma, uncomplicated: Secondary | ICD-10-CM | POA: Diagnosis not present

## 2017-11-02 DIAGNOSIS — Z23 Encounter for immunization: Secondary | ICD-10-CM | POA: Diagnosis not present

## 2017-11-02 MED ORDER — VALACYCLOVIR HCL 1 G PO TABS: 1000.0000 mg | ORAL_TABLET | Freq: Every day | ORAL | 6 refills | Status: DC

## 2017-11-02 NOTE — Progress Notes (Signed)
Patient ID: Morgan Wood, female    DOB: [birth year], 42 y.o.   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 Pt states she is doing well overall at this time. Pt would like to have Rx's printed for Valtrex, Epipen, and Xopenex HFA. Now feeling well nearing end of spring pollen season. Medications discussed. Infrequent need for rescue inhaler and no sleep disturbance.  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 Pt states she feels better than she did in November. Got prednisone taper for asthma exacerbation in November. PCP treated with Augmentin December 1 for sinusitis. 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 GI has put her on Prilosec for heartburn, recognizing potential for airway involvement for reflux. Currently feeling nearly back to baseline after sustained chest tightness with respiratory infection in November. Never started Arnuity Ellipta. We don't remember why Singulair was stopped in the past unless not needed. Currently not aware of wheezing or sleep disturbance. Asks refill valacyclovir for occasional cold sores.   ROS-see HPI + = positive Constitutional:   No-   weight loss, night sweats, fevers, chills, fatigue, lassitude. HEENT:   No-  headaches, difficulty swallowing, tooth/dental  problems, +sore throat,       No-  sneezing, itching, ear ache, +nasal congestion, +post nasal drip,  CV:  No-   chest pain, orthopnea, PND, swelling in lower extremities, anasarca, dizziness, palpitations Resp: No-   shortness of breath with exertion or at rest.              No-   productive cough,  No non-productive cough,  No- coughing up of blood.              No-   change in color of mucus.  No- wheezing.   Skin: No-   rash or lesions. GI:  No-   heartburn, indigestion, abdominal pain, nausea, vomiting,  GU: . MS:  No-   joint pain or swelling.   Neuro-     nothing unusual Psych:  No- change in mood or affect. No depression or anxiety.  No memory loss.  Objective:   Physical Exam amb wf nad General- Alert, Oriented, Affect-appropriate, Distress- none acute, slender Skin- rash-none, lesions- none, excoriation- none Lymphadenopathy- none Head- atraumatic            Eyes- Gross vision intact, PERRLA, conjunctivae clear secretions            Ears- Hearing, canals-normal, TMs clear            Nose-  turbinate edema, no-Septal dev, polyps, erosion, perforation             Throat- Mallampati II , mucosa clear , drainage- none, tonsils- present Neck- flexible , trachea midline, no stridor , thyroid nl, carotid no bruit Chest - symmetrical excursion , unlabored           Heart/CV- RRR , no murmur , no gallop  , no rub, nl s1 s2  JVD- trace/ fills from above , edema- none, stasis changes- none, varices- none           Lung- clear to P&A, wheeze- none, cough- none , dullness-none, rub- none           Chest wall-  Abd-  Soft, no tenderness, with particular attention to left upper quadrant Extrem- cyanosis- none, clubbing, none, atrophy- none, strength- nl

## 2017-11-02 NOTE — Assessment & Plan Note (Signed)
She thinks anxiety contributed to her chest discomfort during illness in November. We discussed maintenance control. She is going to start the Arnuity Ellipta inhaler which she had been given but never tried. Flu vaccine today.

## 2017-11-02 NOTE — Assessment & Plan Note (Signed)
Currently asymptomatic. We discussed seasonal allergic rhinitis and her asthma. She has had no obvious major atopic flare since off of allergy vaccine over the past year. She understands to seek evaluation at an allergy office if OTC medications aren't sufficient.

## 2017-11-02 NOTE — Patient Instructions (Addendum)
As discussed, suggest you start the maintenance steroid inhaler Arnuity- inhale 1 puff, once daily, then rinse mouth  Refill script sent for valacyclovir  [facility] [address], in case you need an allergist and decide to see them.   Please call if we can help

## 2017-11-08 ENCOUNTER — Encounter: Payer: Self-pay | Admitting: Family Medicine

## 2017-11-08 ENCOUNTER — Ambulatory Visit: Payer: BC Managed Care – PPO | Admitting: Family Medicine

## 2017-11-08 VITALS — BP 129/86 | HR 87 | Ht 63.75 in | Wt 116.0 lb

## 2017-11-08 DIAGNOSIS — M7701 Medial epicondylitis, right elbow: Secondary | ICD-10-CM

## 2017-11-08 DIAGNOSIS — J3089 Other allergic rhinitis: Secondary | ICD-10-CM | POA: Diagnosis not present

## 2017-11-08 DIAGNOSIS — F430 Acute stress reaction: Secondary | ICD-10-CM | POA: Diagnosis not present

## 2017-11-08 DIAGNOSIS — J4520 Mild intermittent asthma, uncomplicated: Secondary | ICD-10-CM

## 2017-11-08 DIAGNOSIS — F411 Generalized anxiety disorder: Secondary | ICD-10-CM

## 2017-11-08 DIAGNOSIS — F410 Panic disorder [episodic paroxysmal anxiety] without agoraphobia: Secondary | ICD-10-CM | POA: Diagnosis not present

## 2017-11-08 MED ORDER — FLUOXETINE HCL 10 MG PO TABS: ORAL_TABLET | ORAL | 3 refills | Status: DC

## 2017-11-08 NOTE — Patient Instructions (Signed)
Use brace with activity as needed, ice after activity 15-20 min, avoid aggravating activities.  Go back on prozac 10mg  daily.    Golfer's Elbow  Golfer's elbow, also called medial epicondylitis, is a condition that results from inflammation of the strong bands of tissue (tendons) that attach your forearm muscles to the inside of your bone at the elbow. These tendons affect the muscles that bend the palm toward the wrist (flexion). This condition is called golfer's elbow because it is more common among people who constantly bend and twist their wrists, such as golfers. This injury usually results from overuse. Tendons also become less flexible with age. This condition causes elbow pain that may spread to your forearm and upper arm. The pain may get worse when you bend your wrist downward. What are the causes? This condition is an overuse injury that is caused by:  Repeatedly flexing, turning, or twisting your wrist.  Constantly gripping objects with your hands.  What increases the risk? This condition is more likely to develop in people who play golf or tennis or have jobs that require the constant use of their hands. This injury is more common among:  Carpenters.  Gardeners.  Musicians.  Bricklayers.  Typists.  What are the signs or symptoms? Symptoms of this condition include:  Pain near the inner elbow or forearm.  Reduced grip strength.  How is this diagnosed? This condition is diagnosed based on your symptoms, medical history, and physical exam. During the exam, your health care provider may test your grip strength and move your wrist to check for pain. You may also have an MRI to confirm the diagnosis, look for other issues, and check for tears in the ligaments, muscles, or tendons. How is this treated? Treatment for this condition includes:  Stopping all activities that make you bend or twist your wrist until your pain and other symptoms go away.  Icing your wrist  to relieve pain.  Taking NSAIDs or getting corticosteroid injections to reduce pain and swelling.  Doing stretches, range-of-motion, and strengthening exercises (physical therapy) as told by your health care provider.  In rare cases, surgery may be needed if your condition does not improve. Follow these instructions at home:  If directed, apply ice to the injured area. ? Put ice in a plastic bag. ? Place a towel between your skin and the bag. ? Leave the ice on for 20 minutes, 2-3 times a day.  Move your fingers often to avoid stiffness.  Raise (elevate) the injured area above the level of your heart while you are sitting or lying down.  Return to your normal activities as told by your health care provider. Ask your health care provider what activities are safe for you.  Do exercises as told by your health care provider.  Do not use tobacco products, including cigarettes, chewing tobacco, or e-cigarettes. If you need help quitting, ask your health care provider.  Take over-the-counter and prescription medicines only as told by your health care provider.  Keep all follow-up visits as told by your health care provider. This is important. How is this prevented?  Warm up and stretch before being active.  Cool down and stretch after being active.  Give your body time to rest between periods of activity.  Make sure to use equipment that fits you.  Be safe and responsible while being active to avoid falls.  Do at least 150 minutes of moderate-intensity exercise each week, such as brisk walking or water aerobics.  Maintain physical fitness, including: ? Strength. ? Flexibility. ? Cardiovascular fitness. ? Endurance.  Perform exercises to strengthen the forearm muscles.  Slow your golf swing to reduce shock in the arm when making contact with the ball, if you play golf. Contact a health care provider if:  Your pain does not improve or it gets worse.  You notice numbness  in your hand. Get help right away if:  Your pain is severe.  You cannot move your wrist. This information is not intended to replace advice given to you by your health care provider. Make sure you discuss any questions you have with your health care provider. Document Released: 10/18/2005 Document Revised: 06/22/2016 Document Reviewed: 06/30/2015 Elsevier Interactive Patient Education  [employer].

## 2017-11-08 NOTE — Progress Notes (Signed)
Impression and Recommendations:    1. Panic anxiety syndrome   2. GAD (generalized anxiety disorder)   3. Environmental and seasonal allergies   4. Asthma, mild intermittent, well-controlled   5. Acute reaction to stress   6. Epicondylitis elbow, medial, right     1. Panic anxiety syndrome: Pt was prescribed fluoxetine previously, but discontinued this after 1 week when she states she was feeling better. She was also prescribed buspar, but did not get this medication filled. Pt recommended to stop taking the fluoxetine as she is feeling better without the medication. Pt requested taking a medication once a day at a low dose, so we will prescribe prozac 10mg  as listed below.  2. GAD: Pt prescribed Prozac 10mg  as listed.   3. Environmental and seasonal allergies: Pt instructed to continue taking her OTC allergy medications as listed below.    4. Asthma, mild intermittent, well-controlled: Pt was previously prescribed fluticasone by her pulmonologist, Dr. Vassie Loll, and states this medication does not really do much for her. She was advised to discontinue this medication, to which she agreed with.   5. Acute reaction to stress: This was likely the cause of her recent GI and chest symptoms that led her to subsequently visit the Gastroenterologist, pulmonologist, and cardiologist for workup in the very recent past.     6. Epicondylitis: Recommended pt to ice her wrist for 15-20 minutes after activity and use a brace as needed. Pt advised to avoid aggravating activities like flexion and turning hand in -Follow up after gyn OV, schedule an appointment to further discuss health, wellness, and mood.     Education and routine counseling performed. Handouts provided.   Gross side effects, risk and benefits, and alternatives of medications and treatment plan in general discussed with patient.  Patient is aware that all medications have potential side effects and we are unable to predict every  side effect or drug-drug interaction that may occur.   Patient will call with any questions prior to using medication if they have concerns.  Expresses verbal understanding and consents to current therapy and treatment regimen.  No barriers to understanding were identified.  Red flag symptoms and signs discussed in detail.  Patient expressed understanding regarding what to do in case of emergency\urgent symptoms  Please see AVS handed out to patient at the end of our visit for further patient instructions/ counseling done pertaining to today's office visit.   Return for after GYN OV- come see me.     Note: This document was prepared using Dragon voice recognition software and may include unintentional dictation errors.  This document serves as a record of services personally performed by Thomasene Lot, DO. It was created on her behalf by Thelma Barge, a trained medical scribe. The creation of this record is based on the scribe's personal observations and the provider's statements to them.   I have reviewed the above medical documentation for accuracy and completeness and I concur.  Thomasene Lot 11/14/17 8:01 AM   --------------------------------------------------------------------------------------------------------------------------------------------------------------------------------------------------------------------------------------------    Subjective:    CC:  Chief Complaint  Patient presents with  . Follow-up    HPI: Morgan Wood is a 42 y.o. female who presents to [facility] today for follow-up of mood. Pt is following up from 09-19-17.   Pt saw Dr. Lyn Hollingshead on 10-01-2017 where she received an EKG. She also had an echocardiogram done recently. She has an appointment with her cardiologist next week.  Mood: She was previously prescribed Fluoxetine, which was taken for 1 week where she started feeling better and stopped. She was also  prescribed Buspar, but she did not get this filled. She states her mood, overall, has improved since last visit. She is also requesting another medication that is low dose and only taken once a day.   Asthma: Pt saw her pulmonologist, Dr. Vassie Loll, on 11-02-17 where she was prescribed fluticasone. She has been taking this but states she does not really need to take this medication as her asthma is not exacerbated.  Right hand: Pt states she has a recurring pain in her wrist that is only occasional. 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  Depression screen [facility] 2/9 11/08/2017 10/01/2017 09/19/2017  Decreased Interest 0 0 0  Down, Depressed, Hopeless 0 0 0  PHQ - 2 Score 0 0 0  Altered sleeping 0 - 0  Tired, decreased energy 0 - 1  Change in appetite 0 - 0  Feeling bad or failure about yourself  0 - 0  Trouble concentrating 0 - 0  Moving slowly or fidgety/restless 0 - 0  Suicidal thoughts 0 - 0  PHQ-9 Score 0 - 1  Difficult doing work/chores Not difficult at all - Not difficult at all     GAD 7 : Generalized Anxiety Score 11/08/2017  Nervous, Anxious, on Edge 0  Control/stop worrying 0  Worry too much - different things 1  Trouble relaxing 0  Restless 0  Easily annoyed or irritable 0  Afraid - awful might happen 0  Total GAD 7 Score 1  Anxiety Difficulty Not difficult at all     Wt Readings from Last 3 Encounters:  11/08/17 116 lb (52.6 kg)  11/02/17 115 lb (52.2 kg)  10/01/17 111 lb 4.8 oz (50.5 kg)   BP Readings from Last 3 Encounters:  11/08/17 129/86  11/02/17 114/76  10/01/17 112/82   Pulse Readings from Last 3 Encounters:  11/08/17 87  11/02/17 76  10/01/17 97   BMI Readings from Last 3 Encounters:  11/08/17 20.07 kg/m  11/02/17 19.89 kg/m  10/01/17 19.25 kg/m         Patient Care Team    Relationship Specialty Notifications Start End  Thomasene Lot, DO PCP - General Family Medicine  05/17/16        Patient Active Problem List   Diagnosis Date Noted  . Epicondylitis elbow, medial, right 11/08/2017  . Incomplete RBBB 10/01/2017  . Left anterior fascicular block (LAFB) 10/01/2017  . Sensation of chest tightness 10/01/2017  . GAD (generalized anxiety disorder) 09/19/2017  . Acute reaction to stress 09/19/2017  . Panic anxiety syndrome 09/19/2017  . Muscle spasm 03/10/2017  . Cervicalgia 03/10/2017  . Dysmenorrhea 07/11/2016  . Goes for Allergy desensitization therapy 07/11/2016  . S/P correction of deviated nasal septum- chronic rhinitis and sinusitis 05/18/2016  . Environmental and seasonal allergies 05/18/2016  . h/o Abdominal pain, right upper quadrant 05/18/2016  . Neuropathy, cervical (radicular) 05/18/2016  . R shoulder tendinitis-  05/18/2016  . Constipation 06/16/2015  . Asthma, mild intermittent, well-controlled 04/29/2014  . Pain in right shin 05/03/2013  . Peroneal ganglion cyst 05/03/2013  . Chest pain 02/14/2013  . Allergic rhinitis due to pollen 03/14/2011    Past Medical history, Surgical history, Family history, Social history, Allergies and Medications have been entered into the medical record, reviewed and changed as needed.  Current Meds  Medication Sig  . cetirizine-pseudoephedrine (ZYRTEC-D) 5-120 MG tablet Take 1 tablet by mouth 2 (two) times daily. Take EVERY DAY not PRN  . FLUoxetine (PROZAC) 10 MG tablet Use one half tablet daily for 1 week then increase to 1 tablet daily  . fluticasone (FLONASE) 50 MCG/ACT nasal spray Place 2 sprays daily into both nostrils.  Marland Kitchen. levalbuterol (XOPENEX HFA) 45 MCG/ACT inhaler Inhale 2 puffs every 6 (six) hours as needed into the lungs for wheezing. prn  . Multiple Vitamin (MULTIVITAMIN) capsule Take 1 capsule daily by mouth.  Marland Kitchen. omeprazole (PRILOSEC) 20 MG capsule Take 20 mg by mouth daily.  . valACYclovir (VALTREX) 1000 MG tablet Take 1 tablet (1,000 mg total) by mouth daily.  . [DISCONTINUED] FLUoxetine  (PROZAC) 10 MG tablet Use one half tablet daily for 1 week then increase to 1 tablet daily  . [DISCONTINUED] Fluticasone Furoate (ARNUITY ELLIPTA) 50 MCG/ACT AEPB Inhale 2 puffs into the lungs daily.    Allergies:  Allergies  Allergen Reactions  . Albuterol Other (See Comments)    High Heart Rate Jitters  . Azithromycin Other (See Comments)    Stomach cramps     Review of Systems: Review of Systems: General:   No F/C, wt loss Pulm:   No DIB, SOB, pleuritic chest pain Card:  No CP, palpitations Abd:  No n/v/d or pain Ext:  No inc edema from baseline Psych: no SI/ HI    Objective:   Blood pressure 129/86, pulse 87, height 5' 3.75" (1.619 m), weight 116 lb (52.6 kg), last menstrual period 11/01/2017. Body mass index is 20.07 kg/m. General:  Well Developed, well nourished, appropriate for stated age.  Neuro:  Alert and oriented,  extra-ocular muscles intact  HEENT:  Normocephalic, atraumatic, neck supple, no carotid bruits appreciated  Skin:  no gross rash, warm, pink. Cardiac:  RRR, S1 S2 Respiratory:  ECTA B/L and A/P, Not using accessory muscles, speaking in full sentences- unlabored. Vascular:  Ext warm, no cyanosis apprec.; cap RF less 2 sec. Psych:  No HI/SI, judgement and insight good, Euthymic mood. Full Affect.

## 2017-12-12 ENCOUNTER — Other Ambulatory Visit: Payer: Self-pay | Admitting: Family Medicine

## 2017-12-12 DIAGNOSIS — F430 Acute stress reaction: Secondary | ICD-10-CM

## 2017-12-12 DIAGNOSIS — F411 Generalized anxiety disorder: Secondary | ICD-10-CM

## 2017-12-23 ENCOUNTER — Ambulatory Visit: Payer: BC Managed Care – PPO | Admitting: Family Medicine

## 2018-01-05 ENCOUNTER — Other Ambulatory Visit: Payer: Self-pay

## 2018-01-05 ENCOUNTER — Other Ambulatory Visit (HOSPITAL_COMMUNITY)
Admission: RE | Admit: 2018-01-05 | Discharge: 2018-01-05 | Disposition: A | Discharge status: Home / Self Care | Payer: BC Managed Care – PPO | Source: Ambulatory Visit | Attending: Obstetrics & Gynecology | Admitting: Obstetrics & Gynecology

## 2018-01-05 ENCOUNTER — Ambulatory Visit (INDEPENDENT_AMBULATORY_CARE_PROVIDER_SITE_OTHER): Payer: BC Managed Care – PPO | Admitting: Obstetrics & Gynecology

## 2018-01-05 ENCOUNTER — Encounter: Payer: Self-pay | Admitting: Obstetrics & Gynecology

## 2018-01-05 VITALS — BP 110/60 | HR 84 | Resp 16 | Ht 63.25 in | Wt 111.0 lb

## 2018-01-05 DIAGNOSIS — Z124 Encounter for screening for malignant neoplasm of cervix: Secondary | ICD-10-CM | POA: Insufficient documentation

## 2018-01-05 DIAGNOSIS — Z01419 Encounter for gynecological examination (general) (routine) without abnormal findings: Secondary | ICD-10-CM | POA: Diagnosis not present

## 2018-01-05 MED ORDER — NAPROXEN 375 MG PO TABS: 375.0000 mg | ORAL_TABLET | Freq: Two times a day (BID) | ORAL | 0 refills | Status: DC

## 2018-01-05 NOTE — Progress Notes (Signed)
42 y.o. G0P0000 SingleCaucasianF here for annual exam.  Doing well.  Cycles are regular.  Flow lasts 4-5 days.  Two heavier days.    Started Prozac due to anxiety.  Taking days 1-10 each month.  Reports she had a virus when this was started.  Was having some anxiety regarding the inability to get well.  Was having some palpitations as well.  She was referred to a audiologist and had an echo that was normal.  May end up stopping the Prozac as she feels so much better and has much less anxiety about her health.    Having a lot of "tail bone pain".  Feels like   Patient's last menstrual period was 01/04/2018.          Sexually active: No.  The current method of family planning is abstinence.    Exercising: Yes.    sports Smoker:  no  Health Maintenance: Pap:  06/16/15 Neg  History of abnormal Pap:  no MMG:  09/22/16 BIRADS1:neg  TDaP:  2015 Hep C testing: n/a Screening Labs: Here today    reports that  has never smoked. she has never used smokeless tobacco. She reports that she does not drink alcohol or use drugs.  Past Medical History:  Diagnosis Date  . Allergic rhinitis   . Allergy   . Asthma   . Bronchitis    1-2 episodes yearly  . Environmental allergies    rash,swelling in throat,itching  . Headache(784.0)   . Laryngitis    2-3episodes yearly    Past Surgical History:  Procedure Laterality Date  . bulging disc C3 C4  2006  . EUS  10/13/2012   Procedure: UPPER ENDOSCOPIC ULTRASOUND (EUS) LINEAR;  Surgeon: Theda Belfast, MD;  Location: [facility];  Service: Endoscopy;  Laterality: N/A;  . NASAL SEPTUM SURGERY  11/01/2002  . UPPER GASTROINTESTINAL ENDOSCOPY  11/13; 12/13  . WISDOM TOOTH EXTRACTION      Current Outpatient Medications  Medication Sig Dispense Refill  . cetirizine-pseudoephedrine (ZYRTEC-D) 5-120 MG tablet Take 1 tablet by mouth 2 (two) times daily. Take EVERY DAY not PRN 28 tablet 1  . FLUoxetine (PROZAC) 10 MG tablet Use one half tablet daily for 1  week then increase to 1 tablet daily 90 tablet 3  . levalbuterol (XOPENEX HFA) 45 MCG/ACT inhaler Inhale 2 puffs every 6 (six) hours as needed into the lungs for wheezing. prn 1 Inhaler 12  . Multiple Vitamin (MULTIVITAMIN) capsule Take 1 capsule daily by mouth.    Marland Kitchen omeprazole (PRILOSEC) 20 MG capsule Take 20 mg by mouth daily.    . valACYclovir (VALTREX) 1000 MG tablet Take 1 tablet (1,000 mg total) by mouth daily. 30 tablet 6   No current facility-administered medications for this visit.     Family History  Problem Relation Age of Onset  . Hypertension Father   . Diabetes Other   . Heart disease Other   . Cancer Maternal Grandfather        skin cancer  . Heart attack Maternal Grandfather   . Emphysema Maternal Grandfather   . Parkinsonism Paternal Grandmother   . Heart attack Paternal Grandfather   . Cancer Maternal Grandmother     ROS:  Pertinent items are noted in HPI.  Otherwise, a comprehensive ROS was negative.  Exam:   BP 110/60 (BP Location: Right Arm, Patient Position: Sitting, Cuff Size: Normal)   Pulse 84   Resp 16   Ht 5' 3.25" (1.607 m)   Wt  111 lb (50.3 kg)   LMP 01/04/2018   BMI 19.51 kg/m   Weight change: stable   Height: 5' 3.25" (160.7 cm)  Ht Readings from Last 3 Encounters:  01/05/18 5' 3.25" (1.607 m)  11/08/17 5' 3.75" (1.619 m)  11/02/17 5' 3.75" (1.619 m)    General appearance: alert, cooperative and appears stated age Head: Normocephalic, without obvious abnormality, atraumatic Neck: no adenopathy, supple, symmetrical, trachea midline and thyroid normal to inspection and palpation Lungs: clear to auscultation bilaterally Breasts: normal appearance, no masses or tenderness Heart: regular rate and rhythm Abdomen: soft, non-tender; bowel sounds normal; no masses,  no organomegaly Extremities: extremities normal, atraumatic, no cyanosis or edema Skin: Skin color, texture, turgor normal. No rashes or lesions Lymph nodes: Cervical,  supraclavicular, and axillary nodes normal. No abnormal inguinal nodes palpated Neurologic: Grossly normal   Pelvic: External genitalia:  no lesions              Urethra:  normal appearing urethra with no masses, tenderness or lesions              Bartholins and Skenes: normal                 Vagina: normal appearing vagina with normal color and discharge, no lesions              Cervix: no lesions              Pap taken: Yes.   Bimanual Exam:  Uterus:  normal size, contour, position, consistency, mobility, non-tender              Adnexa: normal adnexa and no mass, fullness, tenderness               Rectovaginal: Confirms               Anus:  normal sphincter tone, no lesions  Chaperone was present for exam.  A:  Well Woman with normal exam No SA H/o asthma Coccydynia  Fever blisters Hemorrhoids, followed by Dr. Loreta AveMann  P:   Mammogram guidelines reviewed. Aware due.  Doing 3D MMG. pap smear obtained today D/w pt Gardisil recommendations No lab work obtained today Trial of naprosyn 375mg  BID (pt has taken this before without problems), wedge pillow, and ice recommended.  If not significantly better in three weeks, she knows to call Return annually or prn

## 2018-01-05 NOTE — Patient Instructions (Signed)
Tailbone Injury The tailbone (coccyx) is the small bone at the lower end of the spine. A tailbone injury may involve stretched ligaments, bruising, or a broken bone (fracture). Tailbone injuries can be painful, and some may take a long time to heal. What are the causes? This condition is often caused by falling and landing on the tailbone. Other causes include:  Repeated strain or friction from actions such as rowing and bicycling.  Childbirth.  In some cases, the cause may not be known. What increases the risk? This condition is more common in women than in men. What are the signs or symptoms? Symptoms of this condition include:  Pain in the lower back, especially when sitting.  Pain or difficulty when standing up from a sitting position.  Bruising in the tailbone area.  Painful bowel movements.  In women, pain during intercourse.  How is this diagnosed? This condition may be diagnosed based on your symptoms and a physical exam. X-rays may be taken if a fracture is suspected. You may also have other tests, such as a CT scan or MRI. How is this treated? This condition may be treated with medicines to help relieve your pain. Most tailbone injuries heal on their own in 4-6 weeks. However, recovery time may be longer if the injury involves a fracture. Follow these instructions at home:  Take medicines only as directed by your health care provider.  If directed, apply ice to the injured area: ? Put ice in a plastic bag. ? Place a towel between your skin and the bag. ? Leave the ice on for 20 minutes, 2-3 times per day for the first 1-2 days.  Sit on a large, rubber or inflated ring or cushion to ease your pain. Lean forward when you are sitting to help decrease discomfort.  Avoid sitting for long periods of time.  Increase your activity as the pain allows. Perform any exercises that are recommended by your health care provider or physical therapist.  If you have pain during  bowel movements, use stool softeners as directed by your health care provider.  Eat a diet that includes plenty of fiber to help prevent constipation.  Keep all follow-up visits as directed by your health care provider. This is important. How is this prevented? Wear appropriate padding and sports gear when bicycling and rowing. This can help to prevent developing an injury that is caused by repeated strain or friction. Contact a health care provider if:  Your pain becomes worse.  Your bowel movements cause a great deal of discomfort.  You are unable to have a bowel movement.  You have uncontrolled urine loss (urinary incontinence).  You have a fever. This information is not intended to replace advice given to you by your health care provider. Make sure you discuss any questions you have with your health care provider. Document Released: 10/15/2000 Document Revised: 06/17/2016 Document Reviewed: 10/14/2014 Elsevier Interactive Patient Education  2018 [employer]  

## 2018-01-06 LAB — CYTOLOGY - PAP: Diagnosis: NEGATIVE

## 2018-01-09 ENCOUNTER — Other Ambulatory Visit: Payer: Self-pay | Admitting: Family Medicine

## 2018-01-09 DIAGNOSIS — F411 Generalized anxiety disorder: Secondary | ICD-10-CM

## 2018-01-09 DIAGNOSIS — F430 Acute stress reaction: Secondary | ICD-10-CM

## 2018-01-18 ENCOUNTER — Ambulatory Visit: Payer: BC Managed Care – PPO | Admitting: Family Medicine

## 2018-02-27 ENCOUNTER — Ambulatory Visit (INDEPENDENT_AMBULATORY_CARE_PROVIDER_SITE_OTHER): Payer: BC Managed Care – PPO | Admitting: Family Medicine

## 2018-02-27 ENCOUNTER — Encounter: Payer: Self-pay | Admitting: Family Medicine

## 2018-02-27 VITALS — BP 117/81 | HR 68 | Ht 63.0 in | Wt 116.0 lb

## 2018-02-27 DIAGNOSIS — J4520 Mild intermittent asthma, uncomplicated: Secondary | ICD-10-CM | POA: Diagnosis not present

## 2018-02-27 DIAGNOSIS — F410 Panic disorder [episodic paroxysmal anxiety] without agoraphobia: Secondary | ICD-10-CM | POA: Diagnosis not present

## 2018-02-27 DIAGNOSIS — F411 Generalized anxiety disorder: Secondary | ICD-10-CM | POA: Diagnosis not present

## 2018-02-27 DIAGNOSIS — J3089 Other allergic rhinitis: Secondary | ICD-10-CM

## 2018-02-27 DIAGNOSIS — F430 Acute stress reaction: Secondary | ICD-10-CM | POA: Diagnosis not present

## 2018-02-27 DIAGNOSIS — M7701 Medial epicondylitis, right elbow: Secondary | ICD-10-CM | POA: Diagnosis not present

## 2018-02-27 MED ORDER — FLUOXETINE HCL 10 MG PO TABS: ORAL_TABLET | ORAL | 3 refills | Status: DC

## 2018-02-27 NOTE — Progress Notes (Signed)
Impression and Recommendations:    1. Panic anxiety syndrome   2. GAD (generalized anxiety disorder)   3. Environmental and seasonal allergies   4. Asthma, mild intermittent, well-controlled   5. Epicondylitis elbow, medial, right   6. Acute reaction to stress      1. Mood - Patient will continue Prozac as prescribed, 10 days a month.  Tolerates well.  No changes made at this time.  2. Reactive Airway Disease - Discussed that her allergy control is not currently optimized.  - Advised patient against further allergy testing at present.  - Advised the patient that the best treatment for allergen exposure is using AYR or Neilmed sinus rinses BID followed by flonase BID (one spray to each nostril).  Per patient, she has tried sinus rinses in the past but "can't get them to work."  - Strongly encouraged patient to begin using sinus rinses and Flonase.  3. General Health Maintenance - Advised patient to continue working toward exercising to improve health.    - Patient will begin with 20 minutes of activity daily.  Recommended that the patient eventually strive for at least 150 minutes of cardiovascular activity per week according to guidelines established by the [facility].   - Healthy dietary habits encouraged, including low-carb, and high amounts of lean protein in diet.   - Patient should also consume adequate amounts of water - half of body weight in oz of water per day   Education and routine counseling performed. Handouts provided.  4. Follow-Up - Due for fasting blood work in 10/2018 and CPE if desired. - Continue screening with OBGYN. - Encouraged patient to attend dermatology screenings yearly.    Meds ordered this encounter  Medications  . DISCONTD: FLUoxetine (PROZAC) 10 MG tablet    Sig: 1 tablet for 10 days every month perimenstrual    Dispense:  30 tablet    Refill:  3  . FLUoxetine (PROZAC) 10 MG tablet    Sig: 1 tablet for 10 days every month  perimenstrual    Dispense:  30 tablet    Refill:  3    Gross side effects, risk and benefits, and alternatives of medications and treatment plan in general discussed with patient.  Patient is aware that all medications have potential side effects and we are unable to predict every side effect or drug-drug interaction that may occur.   Patient will call with any questions prior to using medication if they have concerns.  Expresses verbal understanding and consents to current therapy and treatment regimen.  No barriers to understanding were identified.  Red flag symptoms and signs discussed in detail.  Patient expressed understanding regarding what to do in case of emergency\urgent symptoms  Please see AVS handed out to patient at the end of our visit for further patient instructions/ counseling done pertaining to today's office visit.   Return for Please call Derm for yearly exam, follow-up early December for CPE- come fasting.     Note: This document was prepared using Dragon voice recognition software and may include unintentional dictation errors.  This document serves as a record of services personally performed by Thomasene Lot, DO. It was created on her behalf by Peggye Fothergill, a trained medical scribe. The creation of this record is based on the scribe's personal observations and the provider's statements to them.   I have reviewed the above medical documentation for accuracy and completeness and I concur.  Thomasene Lot 03/06/18 8:53 AM   --------------------------------------------------------------------------------------------------------------------------------------------------  Subjective:    CC:  Chief Complaint  Patient presents with  . Follow-up    HPI: Morgan Wood is a 42 y.o. female who presents to [facility] today for follow-up of mood.   Mood - Prozac Use Has been on 10 mg of Prozac.  Doing fine on this.   Notes  that Dr. Hyacinth Meeker agreed to it and thought Prozac would be a good idea.  Starts taking the Prozac a couple days prior to her period, and continues until 2-3 days after her period ends (about 10 days per month).  Notes that she can breathe now, whereas formerly she was having trouble breathing, which increased her anxiety.  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  Patient has been doing square breathing exercises when she feels nervous.  Reactive Airway Disease - Flaring Worse in Allergy Season She may return to see an allergist to see if her allergies have changed.   Notes that she's seen some of her allergies return recently.  Comments that her allergies depend on her allergen exposure (dust, grass, mold, cats).   She takes Zyrtec daily.  She used Singulair in the past but went off of it for some reason.  Hasn't had to use her inhaler since November.  Notes that her allergies flare worst in November and April.  Tendonitis Continues wearing her brace.    Depression screen [facility] 2/9 02/27/2018 11/08/2017 10/01/2017  Decreased Interest 0 0 0  Down, Depressed, Hopeless 0 0 0  PHQ - 2 Score 0 0 0  Altered sleeping 0 0 -  Tired, decreased energy 1 0 -  Change in appetite 0 0 -  Feeling bad or failure about yourself  0 0 -  Trouble concentrating 0 0 -  Moving slowly or fidgety/restless 0 0 -  Suicidal thoughts 0 0 -  PHQ-9 Score 1 0 -  Difficult doing work/chores Not difficult at all Not difficult at all -     GAD 7 : Generalized Anxiety Score 11/08/2017  Nervous, Anxious, on Edge 0  Control/stop worrying 0  Worry too much - different things 1  Trouble relaxing 0  Restless 0  Easily annoyed or irritable 0  Afraid - awful might happen 0  Total GAD 7 Score 1  Anxiety Difficulty Not difficult at all     Wt Readings from Last 3  Encounters:  02/27/18 116 lb (52.6 kg)  01/05/18 111 lb (50.3 kg)  11/08/17 116 lb (52.6 kg)   BP Readings from Last 3 Encounters:  02/27/18 117/81  01/05/18 110/60  11/08/17 129/86   Pulse Readings from Last 3 Encounters:  02/27/18 68  01/05/18 84  11/08/17 87   BMI Readings from Last 3 Encounters:  02/27/18 20.55 kg/m  01/05/18 19.51 kg/m  11/08/17 20.07 kg/m         Patient Care Team    Relationship Specialty Notifications Start End  Thomasene Lot, DO PCP - General Family Medicine  05/17/16      Patient Active Problem List   Diagnosis Date Noted  . Epicondylitis elbow, medial, right 11/08/2017  . Incomplete RBBB 10/01/2017  . Left anterior fascicular block (LAFB) 10/01/2017  . Sensation of chest tightness 10/01/2017  . GAD (generalized anxiety disorder) 09/19/2017  . Acute reaction to stress 09/19/2017  .  Panic anxiety syndrome 09/19/2017  . Muscle spasm 03/10/2017  . Cervicalgia 03/10/2017  . Dysmenorrhea 07/11/2016  . Goes for Allergy desensitization therapy 07/11/2016  . S/P correction of deviated nasal septum- chronic rhinitis and sinusitis 05/18/2016  . Environmental and seasonal allergies 05/18/2016  . h/o Abdominal pain, right upper quadrant 05/18/2016  . Neuropathy, cervical (radicular) 05/18/2016  . R shoulder tendinitis-  05/18/2016  . Constipation 06/16/2015  . Asthma, mild intermittent, well-controlled 04/29/2014  . Pain in right shin 05/03/2013  . Peroneal ganglion cyst 05/03/2013  . Chest pain 02/14/2013  . Allergic rhinitis due to pollen 03/14/2011    Past Medical history, Surgical history, Family history, Social history, Allergies and Medications have been entered into the medical record, reviewed and changed as needed.    Current Meds  Medication Sig  . cetirizine-pseudoephedrine (ZYRTEC-D) 5-120 MG tablet Take 1 tablet by mouth 2 (two) times daily. Take EVERY DAY not PRN  . FLUoxetine (PROZAC) 10 MG tablet 1 tablet for 10 days  every month perimenstrual  . levalbuterol (XOPENEX HFA) 45 MCG/ACT inhaler Inhale 2 puffs every 6 (six) hours as needed into the lungs for wheezing. prn  . Multiple Vitamin (MULTIVITAMIN) capsule Take 1 capsule daily by mouth.  . valACYclovir (VALTREX) 1000 MG tablet Take 1 tablet (1,000 mg total) by mouth daily.  . [DISCONTINUED] FLUoxetine (PROZAC) 10 MG tablet Use one half tablet daily for 1 week then increase to 1 tablet daily  . [DISCONTINUED] FLUoxetine (PROZAC) 10 MG tablet 1 tablet for 10 days every month perimenstrual    Allergies:  Allergies  Allergen Reactions  . Albuterol Other (See Comments)    High Heart Rate Jitters  . Azithromycin Other (See Comments)    Stomach cramps     Review of Systems: Review of Systems: General:   No F/C, wt loss Pulm:   No DIB, SOB, pleuritic chest pain Card:  No CP, palpitations Abd:  No n/v/d or pain Ext:  No inc edema from baseline Psych: no SI/ HI    Objective:   Blood pressure 117/81, pulse 68, height  (1.6 m), weight 116 lb (52.6 kg), last menstrual period 01/30/2018, SpO2 98 %. Body mass index is 20.55 kg/m. General:  Well Developed, well nourished, appropriate for stated age.  Neuro:  Alert and oriented,  extra-ocular muscles intact  HEENT:  Normocephalic, atraumatic, neck supple, no carotid bruits appreciated  Skin:  no gross rash, warm, pink. Cardiac:  RRR, S1 S2 Respiratory:  ECTA B/L and A/P, Not using accessory muscles, speaking in full sentences- unlabored. Vascular:  Ext warm, no cyanosis apprec.; cap RF less 2 sec. Psych:  No HI/SI, judgement and insight good, Euthymic mood. Full Affect.

## 2018-05-22 ENCOUNTER — Telehealth: Payer: Self-pay | Admitting: Obstetrics & Gynecology

## 2018-05-22 DIAGNOSIS — M533 Sacrococcygeal disorders, not elsewhere classified: Secondary | ICD-10-CM

## 2018-05-22 NOTE — Telephone Encounter (Signed)
She needs an ortho referral.  Ok to place this to Dr. Lunette StandsAnna Voytek.  Thanks.

## 2018-05-22 NOTE — Telephone Encounter (Signed)
Patient called and requested to speak with the nurse about pain in her tailbone that has come back. She said Dr. Hyacinth MeekerMiller told her to call if if the pain returned or did not go away.

## 2018-05-22 NOTE — Telephone Encounter (Signed)
Spoke with patient. Reports continued "pain in tailbone".  Has been using a cushioned seat and taking naprosyn bid as prescribed by Dr. Hyacinth MeekerMiller at last AEX 07/08/18. Pain initially improved, has not resolved. Denies any other symptoms. Patient states she was advised to return call for referral if not improved. Advised will review with Dr. Hyacinth MeekerMiller and return call with recommendations. Patient agreeable.  Dr. Hyacinth MeekerMiller -please advise on referral.

## 2018-05-23 NOTE — Telephone Encounter (Signed)
Spoke with patient, advised as seen below per Dr. Hyacinth MeekerMiller. Order placed for referral to Orthopedics/ Dr. Clovis RileyVoytec. Advised patient our office referral coordinator will f/u with appt details once scheduled. Patient verbalizes understanding.   Routing to [employer].   Encounter closed.

## 2018-06-10 ENCOUNTER — Encounter: Payer: Self-pay | Admitting: Obstetrics & Gynecology

## 2018-08-21 ENCOUNTER — Telehealth: Payer: Self-pay | Admitting: Obstetrics & Gynecology

## 2018-08-21 NOTE — Telephone Encounter (Signed)
Spoke with patient. She states she has ongoing tailbone pain. History of anal fissures. Sees Dr. Loreta Ave for this.  This weekend she sat for a long period of time and had increased pain.  Today, did not have a bowel movement but did bear down and noticed bright red blood on tissue when wiping her rectum. She is sure this blood came from her rectum.  She states that Dr. Loreta Ave has prescribed her an OTC medication for this issue when it occurs but she is not sure of the name. She states she was going to wait and watch her symptoms but then thought she should come see Dr. Hyacinth Meeker because of her tailbone pain and then the bleeding that occurred. She states her father had a cyst in the rectal area and the patient feels she should be examined for this.  She requests an afternoon appointment to see Dr. Hyacinth Meeker for this.  Office visit scheduled with Dr. Hyacinth Meeker for 08/25/2018. Pt advised to call back if symptoms recur or worsen before appointment.  Encounter to Dr. Hyacinth Meeker and will close.

## 2018-08-21 NOTE — Telephone Encounter (Signed)
Patient noticed blood after urinating and her tailbone has started hurting again like before.

## 2018-08-25 ENCOUNTER — Ambulatory Visit (INDEPENDENT_AMBULATORY_CARE_PROVIDER_SITE_OTHER): Payer: BC Managed Care – PPO | Admitting: Obstetrics & Gynecology

## 2018-08-25 ENCOUNTER — Encounter: Payer: Self-pay | Admitting: Obstetrics & Gynecology

## 2018-08-25 ENCOUNTER — Other Ambulatory Visit: Payer: Self-pay

## 2018-08-25 VITALS — BP 118/66 | HR 84 | Ht 63.0 in | Wt 117.6 lb

## 2018-08-25 DIAGNOSIS — K625 Hemorrhage of anus and rectum: Secondary | ICD-10-CM

## 2018-08-25 DIAGNOSIS — M533 Sacrococcygeal disorders, not elsewhere classified: Secondary | ICD-10-CM

## 2018-08-25 NOTE — Progress Notes (Signed)
GYNECOLOGY  VISIT  CC:   Rectal bleeding, coccyx pain  HPI: 42 y.o. G0P0000 Single White or Caucasian female here for two issues.  First, pt has been experiencing coccyx pain now for several months.  This seem to have started in Feb/March time frame.  She was originally treated with anti-inflammatories, edge pillow and ice.  This really did not make much difference so she was referred to Dr. Charlett Blake.  She had an x-ray and was treated with steroids.  Feels this did help some.  Has been using a special seat since that time.  Doesn't really go anywhere without it.  Would like to know if there is anything else that can be done.  Dr. Charlett Blake did discuss with her coccyx removal which she would not recommend.  I've only had one other pt have this procedure done and she's had continued issues as well.  Second, pt reports bright red bleeding with bowel movement over the weekend.  This was right after a bowel movement and noticed primarily with wiping.  Then again the next morning there was still a small amount present with wiping but this was without a bowel movement.  Denies pain.  Has experienced this in the past and was seen by Dr. Loreta Ave.  Fissure was noted.  Has notexperienced any bleeding since Monday morning.  Would just like to be sure there is nothing going on with this issue.  There is no family hx of colon cancer.  GYNECOLOGIC HISTORY: Patient's last menstrual period was 08/15/2018 (approximate). Contraception: abstinence  Menopausal hormone therapy: none  Patient Active Problem List   Diagnosis Date Noted  . Epicondylitis elbow, medial, right 11/08/2017  . Incomplete RBBB 10/01/2017  . Left anterior fascicular block (LAFB) 10/01/2017  . Sensation of chest tightness 10/01/2017  . GAD (generalized anxiety disorder) 09/19/2017  . Acute reaction to stress 09/19/2017  . Panic anxiety syndrome 09/19/2017  . Muscle spasm 03/10/2017  . Cervicalgia 03/10/2017  . Dysmenorrhea 07/11/2016  . Goes for  Allergy desensitization therapy 07/11/2016  . S/P correction of deviated nasal septum- chronic rhinitis and sinusitis 05/18/2016  . Environmental and seasonal allergies 05/18/2016  . h/o Abdominal pain, right upper quadrant 05/18/2016  . Neuropathy, cervical (radicular) 05/18/2016  . R shoulder tendinitis-  05/18/2016  . Constipation 06/16/2015  . Asthma, mild intermittent, well-controlled 04/29/2014  . Pain in right shin 05/03/2013  . Peroneal ganglion cyst 05/03/2013  . Chest pain 02/14/2013  . Allergic rhinitis due to pollen 03/14/2011    Past Medical History:  Diagnosis Date  . Allergic rhinitis   . Allergy   . Asthma   . Bronchitis    1-2 episodes yearly  . Environmental allergies    rash,swelling in throat,itching  . Headache(784.0)   . Laryngitis    2-3episodes yearly    Past Surgical History:  Procedure Laterality Date  . bulging disc C3 C4  2006  . EUS  10/13/2012   Procedure: UPPER ENDOSCOPIC ULTRASOUND (EUS) LINEAR;  Surgeon: Theda Belfast, MD;  Location: [facility];  Service: Endoscopy;  Laterality: N/A;  . NASAL SEPTUM SURGERY  11/01/2002  . UPPER GASTROINTESTINAL ENDOSCOPY  11/13; 12/13  . WISDOM TOOTH EXTRACTION      MEDS:   Current Outpatient Medications on File Prior to Visit  Medication Sig Dispense Refill  . cetirizine-pseudoephedrine (ZYRTEC-D) 5-120 MG tablet Take 1 tablet by mouth 2 (two) times daily. Take EVERY DAY not PRN 28 tablet 1  . FLUoxetine (PROZAC) 10 MG tablet 1  tablet for 10 days every month perimenstrual 30 tablet 3  . levalbuterol (XOPENEX HFA) 45 MCG/ACT inhaler Inhale 2 puffs every 6 (six) hours as needed into the lungs for wheezing. prn 1 Inhaler 12  . Lidocaine, Anorectal, 5 % CREA Apply topically daily as needed.    . Multiple Vitamin (MULTIVITAMIN) capsule Take 1 capsule daily by mouth.    . valACYclovir (VALTREX) 1000 MG tablet Take 1 tablet (1,000 mg total) by mouth daily. 30 tablet 6  . [DISCONTINUED] Cetirizine HCl (ZYRTEC  PO) Take by mouth as needed.      No current facility-administered medications on file prior to visit.     ALLERGIES: Albuterol and Azithromycin  Family History  Problem Relation Age of Onset  . Hypertension Father   . Diabetes Other   . Heart disease Other   . Cancer Maternal Grandfather        skin cancer  . Heart attack Maternal Grandfather   . Emphysema Maternal Grandfather   . Parkinsonism Paternal Grandmother   . Heart attack Paternal Grandfather   . Cancer Maternal Grandmother     SH:  Single, non smoker  Review of Systems  HENT: Positive for hearing loss.   Gastrointestinal: Positive for blood in stool.  All other systems reviewed and are negative.   PHYSICAL EXAMINATION:    BP 118/66 (BP Location: Right Arm, Patient Position: Sitting, Cuff Size: Normal)   Pulse 84   Ht 5\' 3"  (1.6 m)   Wt 117 lb 9.6 oz (53.3 kg)   LMP 08/15/2018 (Approximate)   BMI 20.83 kg/m      General appearance: alert, cooperative and appears stated age Lymph:  No inguinal LAD  Pelvic: External genitalia:  no lesions              Urethra:  normal appearing urethra with no masses, tenderness or lesions              Bartholins and Skenes: normal                 Anus:  normal sphincter tone, no lesions, no visible fissure, no masses, no hemorrhoids  Chaperone was present for exam.  Assessment: H/O anal fissure in the past Bright red rectal bleeding after BM, no findings on exam today Coccydynia   Plan: Encouraged pt to follow up with Dr. Charlett Blake.  Possibly a steroid injection could be done as the steroid dosing did help some. Pt aware rectal exam is normal today.  Advised to call if occurs again.  Would recommend GI follow up at that time.

## 2018-10-10 ENCOUNTER — Ambulatory Visit (INDEPENDENT_AMBULATORY_CARE_PROVIDER_SITE_OTHER): Payer: BC Managed Care – PPO

## 2018-10-10 VITALS — BP 112/71 | HR 73 | Temp 98.4°F

## 2018-10-10 DIAGNOSIS — Z23 Encounter for immunization: Secondary | ICD-10-CM

## 2018-10-10 NOTE — Progress Notes (Signed)
Pt here for influenza vaccine.  Screening questionnaire reviewed, VIS provided to patient, and any/all patient questions answered.  T. Nelson, CMA  

## 2018-10-22 NOTE — Patient Instructions (Addendum)
If you need medication refills, please be sure to call your pharmacy to request them.   This is the fastest and easiest way for you to obtain refills.   However, if a refill is not appropriate, one of our staff members or the pharmacy should contact you to let you know why it was denied.  Also, once you receive your after visit summary at the end of your appointment, please CAREFULLY and THOROUGHLY read your current medication list.    If you find inaccurate information, or if you take your medications differently than what is documented, even over-the-counter ones, please be sure to call the office and speak to our front desk staff or one of our medical assistants about it so this can be corrected in your chart.    Thank you and it was our pleasure to take care of you today!      Preventive Care for Adults, Female  A healthy lifestyle and preventive care can promote health and wellness. Preventive health guidelines for women include the following key practices.   A routine yearly physical is a good way to check with your health care provider about your health and preventive screening. It is a chance to share any concerns and updates on your health and to receive a thorough exam.   Visit your dentist for a routine exam and preventive care every 6 months. Brush your teeth twice a day and floss once a day. Good oral hygiene prevents tooth decay and gum disease.   The frequency of eye exams is based on your age, health, family medical history, use of contact lenses, and other factors. Follow your health care provider's recommendations for frequency of eye exams.   Eat a healthy diet. Foods like vegetables, fruits, whole grains, low-fat dairy products, and lean protein foods contain the nutrients you need without too many calories. Decrease your intake of foods high in solid fats, added sugars, and salt. Eat the right amount of calories for you.Get information about a proper diet from your  health care provider, if necessary.   Regular physical exercise is one of the most important things you can do for your health. Most adults should get at least 150 minutes of moderate-intensity exercise (any activity that increases your heart rate and causes you to sweat) each week. In addition, most adults need muscle-strengthening exercises on 2 or more days a week.   Maintain a healthy weight. The body mass index (BMI) is a screening tool to identify possible weight problems. It provides an estimate of body fat based on height and weight. Your health care provider can find your BMI, and can help you achieve or maintain a healthy weight.For adults 20 years and older:   - A BMI below 18.5 is considered underweight.   - A BMI of 18.5 to 24.9 is normal.   - A BMI of 25 to 29.9 is considered overweight.   - A BMI of 30 and above is considered obese.   Maintain normal blood lipids and cholesterol levels by exercising and minimizing your intake of trans and saturated fats.  Eat a balanced diet with plenty of fruit and vegetables. Blood tests for lipids and cholesterol should begin at age 7 and be repeated every 5 years minimum.  If your lipid or cholesterol levels are high, you are over 40, or you are at high risk for heart disease, you may need your cholesterol levels checked more frequently.Ongoing high lipid and cholesterol levels should be treated with  medicines if diet and exercise are not working.   If you smoke, find out from your health care provider how to quit. If you do not use tobacco, do not start.   Lung cancer screening is recommended for adults aged 49-80 years who are at high risk for developing lung cancer because of a history of smoking. A yearly low-dose CT scan of the lungs is recommended for people who have at least a 30-pack-year history of smoking and are a current smoker or have quit within the past 15 years. A pack year of smoking is smoking an average of 1 pack of  cigarettes a day for 1 year (for example: 1 pack a day for 30 years or 2 packs a day for 15 years). Yearly screening should continue until the smoker has stopped smoking for at least 15 years. Yearly screening should be stopped for people who develop a health problem that would prevent them from having lung cancer treatment.   If you are pregnant, do not drink alcohol. If you are breastfeeding, be very cautious about drinking alcohol. If you are not pregnant and choose to drink alcohol, do not have more than 1 drink per day. One drink is considered to be 12 ounces (355 mL) of beer, 5 ounces (148 mL) of wine, or 1.5 ounces (44 mL) of liquor.   Avoid use of street drugs. Do not share needles with anyone. Ask for help if you need support or instructions about stopping the use of drugs.   High blood pressure causes heart disease and increases the risk of stroke. Your blood pressure should be checked at least yearly.  Ongoing high blood pressure should be treated with medicines if weight loss and exercise do not work.   If you are 56-66 years old, ask your health care provider if you should take aspirin to prevent strokes.   Diabetes screening involves taking a blood sample to check your fasting blood sugar level. This should be done once every 3 years, after age 76, if you are within normal weight and without risk factors for diabetes. Testing should be considered at a younger age or be carried out more frequently if you are overweight and have at least 1 risk factor for diabetes.   Breast cancer screening is essential preventive care for women. You should practice "breast self-awareness."  This means understanding the normal appearance and feel of your breasts and may include breast self-examination.  Any changes detected, no matter how small, should be reported to a health care provider.  Women in their 10s and 30s should have a clinical breast exam (CBE) by a health care provider as part of a  regular health exam every 1 to 3 years.  After age 33, women should have a CBE every year.  Starting at age 76, women should consider having a mammogram (breast X-ray test) every year.  Women who have a family history of breast cancer should talk to their health care provider about genetic screening.  Women at a high risk of breast cancer should talk to their health care providers about having an MRI and a mammogram every year.   -Breast cancer gene (BRCA)-related cancer risk assessment is recommended for women who have family members with BRCA-related cancers. BRCA-related cancers include breast, ovarian, tubal, and peritoneal cancers. Having family members with these cancers may be associated with an increased risk for harmful changes (mutations) in the breast cancer genes BRCA1 and BRCA2. Results of the assessment will determine the  need for genetic counseling and BRCA1 and BRCA2 testing.   The Pap test is a screening test for cervical cancer. A Pap test can show cell changes on the cervix that might become cervical cancer if left untreated. A Pap test is a procedure in which cells are obtained and examined from the lower end of the uterus (cervix).   - Women should have a Pap test starting at age 69.   - Between ages 43 and 76, Pap tests should be repeated every 2 years.   - Beginning at age 40, you should have a Pap test every 3 years as long as the past 3 Pap tests have been normal.   - Some women have medical problems that increase the chance of getting cervical cancer. Talk to your health care provider about these problems. It is especially important to talk to your health care provider if a new problem develops soon after your last Pap test. In these cases, your health care provider may recommend more frequent screening and Pap tests.   - The above recommendations are the same for women who have or have not gotten the vaccine for human papillomavirus (HPV).   - If you had a hysterectomy for  a problem that was not cancer or a condition that could lead to cancer, then you no longer need Pap tests. Even if you no longer need a Pap test, a regular exam is a good idea to make sure no other problems are starting.   - If you are between ages 18 and 39 years, and you have had normal Pap tests going back 10 years, you no longer need Pap tests. Even if you no longer need a Pap test, a regular exam is a good idea to make sure no other problems are starting.   - If you have had past treatment for cervical cancer or a condition that could lead to cancer, you need Pap tests and screening for cancer for at least 20 years after your treatment.   - If Pap tests have been discontinued, risk factors (such as a new sexual partner) need to be reassessed to determine if screening should be resumed.   - The HPV test is an additional test that may be used for cervical cancer screening. The HPV test looks for the virus that can cause the cell changes on the cervix. The cells collected during the Pap test can be tested for HPV. The HPV test could be used to screen women aged 38 years and older, and should be used in women of any age who have unclear Pap test results. After the age of 45, women should have HPV testing at the same frequency as a Pap test.   Colorectal cancer can be detected and often prevented. Most routine colorectal cancer screening begins at the age of 37 years and continues through age 1 years. However, your health care provider may recommend screening at an earlier age if you have risk factors for colon cancer. On a yearly basis, your health care provider may provide home test kits to check for hidden blood in the stool.  Use of a small camera at the end of a tube, to directly examine the colon (sigmoidoscopy or colonoscopy), can detect the earliest forms of colorectal cancer. Talk to your health care provider about this at age 38, when routine screening begins. Direct exam of the colon should  be repeated every 5 -10 years through age 65 years, unless early forms of pre-cancerous  polyps or small growths are found.   People who are at an increased risk for hepatitis B should be screened for this virus. You are considered at high risk for hepatitis B if:  -You were born in a country where hepatitis B occurs often. Talk with your health care provider about which countries are considered high risk.  - Your parents were born in a high-risk country and you have not received a shot to protect against hepatitis B (hepatitis B vaccine).  - You have HIV or AIDS.  - You use needles to inject street drugs.  - You live with, or have sex with, someone who has Hepatitis B.  - You get hemodialysis treatment.  - You take certain medicines for conditions like cancer, organ transplantation, and autoimmune conditions.   Hepatitis C blood testing is recommended for all people born from [birth year] through 1966 and any individual with known risks for hepatitis C.   Practice safe sex. Use condoms and avoid high-risk sexual practices to reduce the spread of sexually transmitted infections (STIs). STIs include gonorrhea, chlamydia, syphilis, trichomonas, herpes, HPV, and human immunodeficiency virus (HIV). Herpes, HIV, and HPV are viral illnesses that have no cure. They can result in disability, cancer, and death. Sexually active women aged 7 years and younger should be checked for chlamydia. Older women with new or multiple partners should also be tested for chlamydia. Testing for other STIs is recommended if you are sexually active and at increased risk.   Osteoporosis is a disease in which the bones lose minerals and strength with aging. This can result in serious bone fractures or breaks. The risk of osteoporosis can be identified using a bone density scan. Women ages 46 years and over and women at risk for fractures or osteoporosis should discuss screening with their health care providers. Ask your health care  provider whether you should take a calcium supplement or vitamin D to There are also several preventive steps women can take to avoid osteoporosis and resulting fractures or to keep osteoporosis from worsening. -->Recommendations include:  Eat a balanced diet high in fruits, vegetables, calcium, and vitamins.  Get enough calcium. The recommended total intake of is 1,200 mg daily; for best absorption, if taking supplements, divide doses into 250-500 mg doses throughout the day. Of the two types of calcium, calcium carbonate is best absorbed when taken with food but calcium citrate can be taken on an empty stomach.  Get enough vitamin D. [employer] and the [place] recommend at least 1,000 IU per day for women age 20 and over who are at risk of vitamin D deficiency. Vitamin D deficiency can be caused by inadequate sun exposure (for example, those who live in [place]).  Avoid alcohol and smoking. Heavy alcohol intake (more than 7 drinks per week) increases the risk of falls and hip fracture and women smokers tend to lose bone more rapidly and have lower bone mass than nonsmokers. Stopping smoking is one of the most important changes women can make to improve their health and decrease risk for disease.  Be physically active every day. Weight-bearing exercise (for example, fast walking, hiking, jogging, and weight training) may strengthen bones or slow the rate of bone loss that comes with aging. Balancing and muscle-strengthening exercises can reduce the risk of falling and fracture.  Consider therapeutic medications. Currently, several types of effective drugs are available. Healthcare providers can recommend the type most appropriate for each woman.  Eliminate environmental factors that may contribute  to accidents. Falls cause nearly 90% of all osteoporotic fractures, so reducing this risk is an important bone-health strategy. Measures include ample lighting, removing  obstructions to walking, using nonskid rugs on floors, and placing mats and/or grab bars in showers.  Be aware of medication side effects. Some common medicines make bones weaker. These include a type of steroid drug called glucocorticoids used for arthritis and asthma, some antiseizure drugs, certain sleeping pills, treatments for endometriosis, and some cancer drugs. An overactive thyroid gland or using too much thyroid hormone for an underactive thyroid can also be a problem. If you are taking these medicines, talk to your doctor about what you can do to help protect your bones.reduce the rate of osteoporosis.    Menopause can be associated with physical symptoms and risks. Hormone replacement therapy is available to decrease symptoms and risks. You should talk to your health care provider about whether hormone replacement therapy is right for you.   Use sunscreen. Apply sunscreen liberally and repeatedly throughout the day. You should seek shade when your shadow is shorter than you. Protect yourself by wearing long sleeves, pants, a wide-brimmed hat, and sunglasses year round, whenever you are outdoors.   Once a month, do a whole body skin exam, using a mirror to look at the skin on your back. Tell your health care provider of new moles, moles that have irregular borders, moles that are larger than a pencil eraser, or moles that have changed in shape or color.   -Stay current with required vaccines (immunizations).   Influenza vaccine. All adults should be immunized every year.  Tetanus, diphtheria, and acellular pertussis (Td, Tdap) vaccine. Pregnant women should receive 1 dose of Tdap vaccine during each pregnancy. The dose should be obtained regardless of the length of time since the last dose. Immunization is preferred during the 27th 36th week of gestation. An adult who has not previously received Tdap or who does not know her vaccine status should receive 1 dose of Tdap. This initial  dose should be followed by tetanus and diphtheria toxoids (Td) booster doses every 10 years. Adults with an unknown or incomplete history of completing a 3-dose immunization series with Td-containing vaccines should begin or complete a primary immunization series including a Tdap dose. Adults should receive a Td booster every 10 years.  Varicella vaccine. An adult without evidence of immunity to varicella should receive 2 doses or a second dose if she has previously received 1 dose. Pregnant females who do not have evidence of immunity should receive the first dose after pregnancy. This first dose should be obtained before leaving the health care facility. The second dose should be obtained 4 8 weeks after the first dose.  Human papillomavirus (HPV) vaccine. Females aged 35 26 years who have not received the vaccine previously should obtain the 3-dose series. The vaccine is not recommended for use in pregnant females. However, pregnancy testing is not needed before receiving a dose. If a female is found to be pregnant after receiving a dose, no treatment is needed. In that case, the remaining doses should be delayed until after the pregnancy. Immunization is recommended for any person with an immunocompromised condition through the age of 38 years if she did not get any or all doses earlier. During the 3-dose series, the second dose should be obtained 4 8 weeks after the first dose. The third dose should be obtained 24 weeks after the first dose and 16 weeks after the second dose.  Zoster  vaccine. One dose is recommended for adults aged 70 years or older unless certain conditions are present.  Measles, mumps, and rubella (MMR) vaccine. Adults born before [birth year] generally are considered immune to measles and mumps. Adults born in [birth year] or later should have 1 or more doses of MMR vaccine unless there is a contraindication to the vaccine or there is laboratory evidence of immunity to each of the three diseases. A  routine second dose of MMR vaccine should be obtained at least 28 days after the first dose for students attending postsecondary schools, health care workers, or international travelers. People who received inactivated measles vaccine or an unknown type of measles vaccine during 1963 1967 should receive 2 doses of MMR vaccine. People who received inactivated mumps vaccine or an unknown type of mumps vaccine before 1979 and are at high risk for mumps infection should consider immunization with 2 doses of MMR vaccine. For females of childbearing age, rubella immunity should be determined. If there is no evidence of immunity, females who are not pregnant should be vaccinated. If there is no evidence of immunity, females who are pregnant should delay immunization until after pregnancy. Unvaccinated health care workers born before 30 who lack laboratory evidence of measles, mumps, or rubella immunity or laboratory confirmation of disease should consider measles and mumps immunization with 2 doses of MMR vaccine or rubella immunization with 1 dose of MMR vaccine.  Pneumococcal 13-valent conjugate (PCV13) vaccine. When indicated, a person who is uncertain of her immunization history and has no record of immunization should receive the PCV13 vaccine. An adult aged 50 years or older who has certain medical conditions and has not been previously immunized should receive 1 dose of PCV13 vaccine. This PCV13 should be followed with a dose of pneumococcal polysaccharide (PPSV23) vaccine. The PPSV23 vaccine dose should be obtained at least 8 weeks after the dose of PCV13 vaccine. An adult aged 91 years or older who has certain medical conditions and previously received 1 or more doses of PPSV23 vaccine should receive 1 dose of PCV13. The PCV13 vaccine dose should be obtained 1 or more years after the last PPSV23 vaccine dose.  Pneumococcal polysaccharide (PPSV23) vaccine. When PCV13 is also indicated, PCV13 should be  obtained first. All adults aged 81 years and older should be immunized. An adult younger than age 45 years who has certain medical conditions should be immunized. Any person who resides in a nursing home or long-term care facility should be immunized. An adult smoker should be immunized. People with an immunocompromised condition and certain other conditions should receive both PCV13 and PPSV23 vaccines. People with human immunodeficiency virus (HIV) infection should be immunized as soon as possible after diagnosis. Immunization during chemotherapy or radiation therapy should be avoided. Routine use of PPSV23 vaccine is not recommended for American Indians, [place] Natives, or people younger than 65 years unless there are medical conditions that require PPSV23 vaccine. When indicated, people who have unknown immunization and have no record of immunization should receive PPSV23 vaccine. One-time revaccination 5 years after the first dose of PPSV23 is recommended for people aged 68 64 years who have chronic kidney failure, nephrotic syndrome, asplenia, or immunocompromised conditions. People who received 1 2 doses of PPSV23 before age 71 years should receive another dose of PPSV23 vaccine at age 71 years or later if at least 5 years have passed since the previous dose. Doses of PPSV23 are not needed for people immunized with PPSV23 at or after age 68 years.  Meningococcal vaccine. Adults with asplenia or persistent complement component deficiencies should receive 2 doses of quadrivalent meningococcal conjugate (MenACWY-D) vaccine. The doses should be obtained at least 2 months apart. Microbiologists working with certain meningococcal bacteria, [employer] recruits, people at risk during an outbreak, and people who travel to or live in countries with a high rate of meningitis should be immunized. A first-year college student up through age 48 years who is living in a residence hall should receive a dose if she did not  receive a dose on or after her 16th birthday. Adults who have certain high-risk conditions should receive one or more doses of vaccine.  Hepatitis A vaccine. Adults who wish to be protected from this disease, have certain high-risk conditions, work with hepatitis A-infected animals, work in hepatitis A research labs, or travel to or work in countries with a high rate of hepatitis A should be immunized. Adults who were previously unvaccinated and who anticipate close contact with an international adoptee during the first 60 days after arrival in the [place] from a country with a high rate of hepatitis A should be immunized.  Hepatitis B vaccine.  Adults who wish to be protected from this disease, have certain high-risk conditions, may be exposed to blood or other infectious body fluids, are household contacts or sex partners of hepatitis B positive people, are clients or workers in certain care facilities, or travel to or work in countries with a high rate of hepatitis B should be immunized.  Haemophilus influenzae type b (Hib) vaccine. A previously unvaccinated person with asplenia or sickle cell disease or having a scheduled splenectomy should receive 1 dose of Hib vaccine. Regardless of previous immunization, a recipient of a hematopoietic stem cell transplant should receive a 3-dose series 6 12 months after her successful transplant. Hib vaccine is not recommended for adults with HIV infection.  Preventive Services / Frequency Ages 33 to 39years  Blood pressure check.** / Every 1 to 2 years.  Lipid and cholesterol check.** / Every 5 years beginning at age 40.  Clinical breast exam.** / Every 3 years for women in their 8s and 43s.  BRCA-related cancer risk assessment.** / For women who have family members with a BRCA-related cancer (breast, ovarian, tubal, or peritoneal cancers).  Pap test.** / Every 2 years from ages 41 through 12. Every 3 years starting at age 61 through age 102 or 61  with a history of 3 consecutive normal Pap tests.  HPV screening.** / Every 3 years from ages 85 through ages 78 to 69 with a history of 3 consecutive normal Pap tests.  Hepatitis C blood test.** / For any individual with known risks for hepatitis C.  Skin self-exam. / Monthly.  Influenza vaccine. / Every year.  Tetanus, diphtheria, and acellular pertussis (Tdap, Td) vaccine.** / Consult your health care provider. Pregnant women should receive 1 dose of Tdap vaccine during each pregnancy. 1 dose of Td every 10 years.  Varicella vaccine.** / Consult your health care provider. Pregnant females who do not have evidence of immunity should receive the first dose after pregnancy.  HPV vaccine. / 3 doses over 6 months, if 32 and younger. The vaccine is not recommended for use in pregnant females. However, pregnancy testing is not needed before receiving a dose.  Measles, mumps, rubella (MMR) vaccine.** / You need at least 1 dose of MMR if you were born in 1957 or later. You may also need a 2nd dose. For females of childbearing age,  rubella immunity should be determined. If there is no evidence of immunity, females who are not pregnant should be vaccinated. If there is no evidence of immunity, females who are pregnant should delay immunization until after pregnancy.  Pneumococcal 13-valent conjugate (PCV13) vaccine.** / Consult your health care provider.  Pneumococcal polysaccharide (PPSV23) vaccine.** / 1 to 2 doses if you smoke cigarettes or if you have certain conditions.  Meningococcal vaccine.** / 1 dose if you are age 55 to 36 years and a Market researcher living in a residence hall, or have one of several medical conditions, you need to get vaccinated against meningococcal disease. You may also need additional booster doses.  Hepatitis A vaccine.** / Consult your health care provider.  Hepatitis B vaccine.** / Consult your health care provider.  Haemophilus influenzae type b (Hib)  vaccine.** / Consult your health care provider.  Ages 6 to 64years  Blood pressure check.** / Every 1 to 2 years.  Lipid and cholesterol check.** / Every 5 years beginning at age 71 years.  Lung cancer screening. / Every year if you are aged 26 80 years and have a 30-pack-year history of smoking and currently smoke or have quit within the past 15 years. Yearly screening is stopped once you have quit smoking for at least 15 years or develop a health problem that would prevent you from having lung cancer treatment.  Clinical breast exam.** / Every year after age 80 years.  BRCA-related cancer risk assessment.** / For women who have family members with a BRCA-related cancer (breast, ovarian, tubal, or peritoneal cancers).  Mammogram.** / Every year beginning at age 51 years and continuing for as long as you are in good health. Consult with your health care provider.  Pap test.** / Every 3 years starting at age 16 years through age 42 or 5 years with a history of 3 consecutive normal Pap tests.  HPV screening.** / Every 3 years from ages 70 years through ages 37 to 22 years with a history of 3 consecutive normal Pap tests.  Fecal occult blood test (FOBT) of stool. / Every year beginning at age 20 years and continuing until age 42 years. You may not need to do this test if you get a colonoscopy every 10 years.  Flexible sigmoidoscopy or colonoscopy.** / Every 5 years for a flexible sigmoidoscopy or every 10 years for a colonoscopy beginning at age 26 years and continuing until age 80 years.  Hepatitis C blood test.** / For all people born from [birth year] through 1965 and any individual with known risks for hepatitis C.  Skin self-exam. / Monthly.  Influenza vaccine. / Every year.  Tetanus, diphtheria, and acellular pertussis (Tdap/Td) vaccine.** / Consult your health care provider. Pregnant women should receive 1 dose of Tdap vaccine during each pregnancy. 1 dose of Td every 10  years.  Varicella vaccine.** / Consult your health care provider. Pregnant females who do not have evidence of immunity should receive the first dose after pregnancy.  Zoster vaccine.** / 1 dose for adults aged 60 years or older.  Measles, mumps, rubella (MMR) vaccine.** / You need at least 1 dose of MMR if you were born in 1957 or later. You may also need a 2nd dose. For females of childbearing age, rubella immunity should be determined. If there is no evidence of immunity, females who are not pregnant should be vaccinated. If there is no evidence of immunity, females who are pregnant should delay immunization until after pregnancy.  Pneumococcal  13-valent conjugate (PCV13) vaccine.** / Consult your health care provider.  Pneumococcal polysaccharide (PPSV23) vaccine.** / 1 to 2 doses if you smoke cigarettes or if you have certain conditions.  Meningococcal vaccine.** / Consult your health care provider.  Hepatitis A vaccine.** / Consult your health care provider.  Hepatitis B vaccine.** / Consult your health care provider.  Haemophilus influenzae type b (Hib) vaccine.** / Consult your health care provider.  Ages 14 years and over  Blood pressure check.** / Every 1 to 2 years.  Lipid and cholesterol check.** / Every 5 years beginning at age 47 years.  Lung cancer screening. / Every year if you are aged 66 80 years and have a 30-pack-year history of smoking and currently smoke or have quit within the past 15 years. Yearly screening is stopped once you have quit smoking for at least 15 years or develop a health problem that would prevent you from having lung cancer treatment.  Clinical breast exam.** / Every year after age 47 years.  BRCA-related cancer risk assessment.** / For women who have family members with a BRCA-related cancer (breast, ovarian, tubal, or peritoneal cancers).  Mammogram.** / Every year beginning at age 67 years and continuing for as long as you are in good  health. Consult with your health care provider.  Pap test.** / Every 3 years starting at age 49 years through age 24 or 72 years with 3 consecutive normal Pap tests. Testing can be stopped between 65 and 70 years with 3 consecutive normal Pap tests and no abnormal Pap or HPV tests in the past 10 years.  HPV screening.** / Every 3 years from ages 81 years through ages 63 or 52 years with a history of 3 consecutive normal Pap tests. Testing can be stopped between 65 and 70 years with 3 consecutive normal Pap tests and no abnormal Pap or HPV tests in the past 10 years.  Fecal occult blood test (FOBT) of stool. / Every year beginning at age 62 years and continuing until age 90 years. You may not need to do this test if you get a colonoscopy every 10 years.  Flexible sigmoidoscopy or colonoscopy.** / Every 5 years for a flexible sigmoidoscopy or every 10 years for a colonoscopy beginning at age 60 years and continuing until age 11 years.  Hepatitis C blood test.** / For all people born from [birth year] through 1965 and any individual with known risks for hepatitis C.  Osteoporosis screening.** / A one-time screening for women ages 11 years and over and women at risk for fractures or osteoporosis.  Skin self-exam. / Monthly.  Influenza vaccine. / Every year.  Tetanus, diphtheria, and acellular pertussis (Tdap/Td) vaccine.** / 1 dose of Td every 10 years.  Varicella vaccine.** / Consult your health care provider.  Zoster vaccine.** / 1 dose for adults aged 46 years or older.  Pneumococcal 13-valent conjugate (PCV13) vaccine.** / Consult your health care provider.  Pneumococcal polysaccharide (PPSV23) vaccine.** / 1 dose for all adults aged 38 years and older.  Meningococcal vaccine.** / Consult your health care provider.  Hepatitis A vaccine.** / Consult your health care provider.  Hepatitis B vaccine.** / Consult your health care provider.  Haemophilus influenzae type b (Hib) vaccine.** /  Consult your health care provider. ** Family history and personal history of risk and conditions may change your health care provider's recommendations. Document Released: 12/14/2001 Document Revised: 08/08/2013  [facility] Patient Information 2014 [place], [place].   EXERCISE AND DIET:  We recommended that  you start or continue a regular exercise program for good health. Regular exercise means any activity that makes your heart beat faster and makes you sweat.  We recommend exercising at least 30 minutes per day at least 3 days a week, preferably 5.  We also recommend a diet low in fat and sugar / carbohydrates.  Inactivity, poor dietary choices and obesity can cause diabetes, heart attack, stroke, and kidney damage, among others.     ALCOHOL AND SMOKING:  Women should limit their alcohol intake to no more than 7 drinks/beers/glasses of wine (combined, not each!) per week. Moderation of alcohol intake to this level decreases your risk of breast cancer and liver damage.  ( And of course, no recreational drugs are part of a healthy lifestyle.)  Also, you should not be smoking at all or even being exposed to second hand smoke. Most people know smoking can cause cancer, and various heart and lung diseases, but did you know it also contributes to weakening of your bones?  Aging of your skin?  Yellowing of your teeth and nails?   CALCIUM AND VITAMIN D:  Adequate intake of calcium and Vitamin D are recommended.  The recommendations for exact amounts of these supplements seem to change often, but generally speaking 600 mg of calcium (either carbonate or citrate) and 800 units of Vitamin D per day seems prudent. Certain women may benefit from higher intake of Vitamin D.  If you are among these women, your doctor will have told you during your visit.     PAP SMEARS:  Pap smears, to check for cervical cancer or precancers,  have traditionally been done yearly, although recent scientific advances have shown that  most women can have pap smears less often.  However, every woman still should have a physical exam from her gynecologist or primary care physician every year. It will include a breast check, inspection of the vulva and vagina to check for abnormal growths or skin changes, a visual exam of the cervix, and then an exam to evaluate the size and shape of the uterus and ovaries.  And after 43 years of age, a rectal exam is indicated to check for rectal cancers. We will also provide age appropriate advice regarding health maintenance, like when you should have certain vaccines, screening for sexually transmitted diseases, bone density testing, colonoscopy, mammograms, etc.    MAMMOGRAMS:  All women over 69 years old should have a yearly mammogram. Many facilities now offer a "3D" mammogram, which may cost around $50 extra out of pocket. If possible,  we recommend you accept the option to have the 3D mammogram performed.  It both reduces the number of women who will be called back for extra views which then turn out to be normal, and it is better than the routine mammogram at detecting truly abnormal areas.     COLONOSCOPY:  Colonoscopy to screen for colon cancer is recommended for all women at age 79.  We know, you hate the idea of the prep.  We agree, BUT, having colon cancer and not knowing it is worse!!  Colon cancer so often starts as a polyp that can be seen and removed at colonscopy, which can quite literally save your life!  And if your first colonoscopy is normal and you have no family history of colon cancer, most women don't have to have it again for 10 years.  Once every ten years, you can do something that may end up saving your life, right?  We will be happy to help you get it scheduled when you are ready.  Be sure to check your insurance coverage so you understand how much it will cost.  It may be covered as a preventative service at no cost, but you should check your particular policy.

## 2018-10-23 ENCOUNTER — Ambulatory Visit (INDEPENDENT_AMBULATORY_CARE_PROVIDER_SITE_OTHER): Payer: BC Managed Care – PPO | Admitting: Family Medicine

## 2018-10-23 ENCOUNTER — Encounter: Payer: Self-pay | Admitting: Family Medicine

## 2018-10-23 VITALS — BP 105/75 | HR 73 | Temp 98.0°F | Ht 63.25 in | Wt 117.7 lb

## 2018-10-23 DIAGNOSIS — Z1239 Encounter for other screening for malignant neoplasm of breast: Secondary | ICD-10-CM

## 2018-10-23 DIAGNOSIS — Z719 Counseling, unspecified: Secondary | ICD-10-CM

## 2018-10-23 DIAGNOSIS — Z0000 Encounter for general adult medical examination without abnormal findings: Secondary | ICD-10-CM | POA: Diagnosis not present

## 2018-10-23 DIAGNOSIS — Z0100 Encounter for examination of eyes and vision without abnormal findings: Secondary | ICD-10-CM | POA: Diagnosis not present

## 2018-10-23 MED ORDER — LEVALBUTEROL TARTRATE 45 MCG/ACT IN AERO: 1.0000 | INHALATION_SPRAY | Freq: Four times a day (QID) | RESPIRATORY_TRACT | 2 refills | Status: DC | PRN

## 2018-10-23 NOTE — Addendum Note (Signed)
Addended by: Leda MinPULLIAM, MELISSA D on: 10/23/2018 09:03 AM   Modules accepted: Orders

## 2018-10-23 NOTE — Progress Notes (Signed)
Impression and Recommendations:    1. Encounter for wellness examination   2. Health education/counseling   3. Screening for breast cancer      1) Anticipatory Guidance: Discussed importance of wearing a seatbelt while driving, not texting while driving; sunscreen when outside along with yearly skin surveillance; eating a well balanced and modest diet; physical activity at least 25 minutes per day or 150 min/ week of moderate to intense activity.  2) Immunizations / Screenings / Labs:  All immunizations and screenings that patient agrees to, are up-to-date per recommendations or will be updated today.  Patient understands the needs for q 26mo dental and yearly vision screens which pt will schedule independently. Obtain CBC, CMP, HgA1c, Lipid panel, TSH and vit D when fasting if not already done recently.   3) Weight:   Discussed goal of maintaining weight as her BMI is perfect at 20.69.  Encouraged patient to engage in regular exercise of 30 minutes at least 5 days or more per week.   Improve nutrient density of diet through increasing intake of fruits and vegetables and decreasing saturated/trans fats, white flour products and refined sugar products.   4) Environmental allergies: We refill her albuterol which he uses seldomly for allergies and exposure to cats, dogs, pine trees etc.  She only needs a refill because hers is out of date.  Meds ordered this encounter  Medications  . levalbuterol (XOPENEX HFA) 45 MCG/ACT inhaler    Sig: Inhale 1-2 puffs into the lungs every 6 (six) hours as needed for wheezing or shortness of breath. prn    Dispense:  1 Inhaler    Refill:  2    Orders Placed This Encounter  Procedures  . MM Digital Screening  . CBC with Differential/Platelet  . Comprehensive metabolic panel  . Hemoglobin A1c  . Lipid panel  . T4, free  . TSH  . VITAMIN D 25 Hydroxy (Vit-D Deficiency, Fractures)  . HIV antibody (with reflex)  . Hepatitis C Antibody    Gross  side effects, risk and benefits, and alternatives of medications discussed with patient.  Patient is aware that all medications have potential side effects and we are unable to predict every side effect or drug-drug interaction that may occur.  Expresses verbal understanding and consents to current therapy plan and treatment regimen.  F-up preventative CPE in 1 year. F/up sooner for chronic care management as discussed and/or prn.  Please see orders placed and AVS handed out to patient at the end of our visit for further patient instructions/ counseling done pertaining to today's office visit.   Thomasene Lot, DO 8:44 AM     Subjective:    Chief Complaint  Patient presents with  . Annual Exam   CC: Patient does talk about her coccydynia in which she saw Dr. and avoid tach the orthopedist for.  I encouraged her to follow-up if she still having problems as she may need further imaging beyond plain film.  Currently, her symptoms are well controlled and she uses a pillow.  HPI: JAALIYAH LUCATERO is a 42 y.o. female who presents to [facility] today a yearly health maintenance exam.  Health Maintenance Summary Reviewed and updated, unless pt declines services.  Colonoscopy:     (Unnecessary secondary to < 47 or > 27 years old.) Tobacco History Reviewed:   Y -none, no secondary exposure. CT scan for screening lung CA:   Not applicable Abdominal Ultrasound:     (  Unnecessary secondary to < 5965 or > 43 years old) Alcohol:    No concerns, no excessive use Exercise Habits:   Not meeting AHA guidelines-patient exercises during summer not during school year as she is a Interior and spatial designerbusy schoolteacher.  Encouraged her to do 30 minutes 5 days/week. STD concerns:   None; sees Dr. Valentina ShaggyMary Miller of GYN regularly for her female care.  Gets a Pap smear every 2 years or so. Drug Use:   None Birth control method:   n/a Menses regular:     Yes, Lumps or breast concerns:      No; [facility] and [facility] will call for appointment. Breast Cancer Family History:      No Bone/ DEXA scan:     ( Unnecessary due to < 65 and average risk)   Immunization History  Administered Date(s) Administered  . H1N1 11/04/2008  . Influenza Split 09/06/2011  . Influenza Whole 09/01/2009, 09/07/2010, 09/01/2012  . Influenza,inj,Quad PF,6+ Mos 09/17/2013, 11/02/2017, 10/10/2018  . Tdap 02/19/2008    Health Maintenance  Topic Date Due  . TETANUS/TDAP  02/28/2019 (Originally 02/18/2018)  . HIV Screening  10/24/2019 (Originally 02/18/1990)  . PAP SMEAR-Modifier  01/05/2021  . INFLUENZA VACCINE  Completed     Wt Readings from Last 3 Encounters:  10/23/18 117 lb 11.2 oz (53.4 kg)  08/25/18 117 lb 9.6 oz (53.3 kg)  02/27/18 116 lb (52.6 kg)   BP Readings from Last 3 Encounters:  10/23/18 105/75  10/10/18 112/71  08/25/18 118/66   Pulse Readings from Last 3 Encounters:  10/23/18 73  10/10/18 73  08/25/18 84     Past Medical History:  Diagnosis Date  . Allergic rhinitis   . Allergy   . Asthma   . Bronchitis    1-2 episodes yearly  . Environmental allergies    rash,swelling in throat,itching  . Headache(784.0)   . Laryngitis    2-3episodes yearly      Past Surgical History:  Procedure Laterality Date  . bulging disc C3 C4  2006  . EUS  10/13/2012   Procedure: UPPER ENDOSCOPIC ULTRASOUND (EUS) LINEAR;  Surgeon: Theda BelfastPatrick D Hung, MD;  Location: [facility];  Service: Endoscopy;  Laterality: N/A;  . NASAL SEPTUM SURGERY  11/01/2002  . UPPER GASTROINTESTINAL ENDOSCOPY  11/13; 12/13  . WISDOM TOOTH EXTRACTION        Family History  Problem Relation Age of Onset  . Hypertension Father   . Diabetes Other   . Heart disease Other   . Cancer Maternal Grandfather        skin cancer  . Heart attack Maternal Grandfather   . Emphysema Maternal Grandfather   . Parkinsonism Paternal Grandmother   . Heart attack Paternal Grandfather   . Cancer Maternal  Grandmother       Social History   Substance and Sexual Activity  Drug Use No  ,   Social History   Substance and Sexual Activity  Alcohol Use No  ,   Social History   Tobacco Use  Smoking Status Never Smoker  Smokeless Tobacco Never Used  ,   Social History   Substance and Sexual Activity  Sexual Activity Not Currently  . Partners: Male  . Birth control/protection: Abstinence    Current Outpatient Medications on File Prior to Visit  Medication Sig Dispense Refill  . cetirizine-pseudoephedrine (ZYRTEC-D) 5-120 MG tablet Take 1 tablet by mouth 2 (two) times daily. Take EVERY DAY not PRN 28 tablet 1  .  Lidocaine, Anorectal, 5 % CREA Apply topically daily as needed.    . Multiple Vitamin (MULTIVITAMIN) capsule Take 1 capsule daily by mouth.    . valACYclovir (VALTREX) 1000 MG tablet Take 1 tablet (1,000 mg total) by mouth daily. 30 tablet 6  . FLUoxetine (PROZAC) 10 MG tablet 1 tablet for 10 days every month perimenstrual 30 tablet 3  . [DISCONTINUED] Cetirizine HCl (ZYRTEC PO) Take by mouth as needed.      No current facility-administered medications on file prior to visit.     Allergies: Albuterol and Azithromycin  Review of Systems: General:   Denies fever, chills, unexplained weight loss.  Optho/Auditory:   Denies visual changes, blurred vision/LOV Respiratory:   Denies SOB, DOE more than baseline levels.  Cardiovascular:   Denies chest pain, palpitations, new onset peripheral edema  Gastrointestinal:   Denies nausea, vomiting, diarrhea.  Genitourinary: Denies dysuria, freq/ urgency, flank pain or discharge from genitals.  Endocrine:     Denies hot or cold intolerance, polyuria, polydipsia. Musculoskeletal:   Denies unexplained myalgias, joint swelling, unexplained arthralgias, gait problems.  Skin:  Denies rash, suspicious lesions Neurological:     Denies dizziness, unexplained weakness, numbness  Psychiatric/Behavioral:   Denies mood changes, suicidal or  homicidal ideations, hallucinations    Objective:    Blood pressure 105/75, pulse 73, temperature 98 F (36.7 C), height 5' 3.25" (1.607 m), weight 117 lb 11.2 oz (53.4 kg), last menstrual period 10/02/2018, SpO2 99 %. Body mass index is 20.69 kg/m. General Appearance:    Alert, cooperative, no distress, appears stated age  Head:    Normocephalic, without obvious abnormality, atraumatic  Eyes:    PERRL, conjunctiva/corneas clear, EOM's intact, fundi    benign, both eyes  Ears:    Normal TM's and external ear canals, both ears  Nose:   Nares normal, septum midline, mucosa normal, no drainage    or sinus tenderness  Throat:   Lips w/o lesion, mucosa moist, and tongue normal; teeth and   gums normal  Neck:   Supple, symmetrical, trachea midline, no adenopathy;    thyroid:  no enlargement/tenderness/nodules; no carotid   bruit or JVD  Back:     Symmetric, no curvature, ROM normal, no CVA tenderness  Lungs:     Clear to auscultation bilaterally, respirations unlabored, no       Wh/ R/ R  Chest Wall:    No tenderness or gross deformity; normal excursion   Heart:    Regular rate and rhythm, S1 and S2 normal, no murmur, rub   or gallop  Breast Exam:   Patient declined, deferred to GYN  Abdomen:     Soft, non-tender, bowel sounds active all four quadrants, NO   G/R/R, no masses, no organomegaly  Genitalia:   Deferred to GYN  Rectal:   Not done-deferred  Extremities:   Extremities normal, atraumatic, no cyanosis or gross edema  Pulses:   2+ and symmetric all extremities  Skin:   Warm, dry, Skin color, texture, turgor normal, no obvious rashes or lesions Psych: No HI/SI, judgement and insight good, Euthymic mood. Full Affect.  Neurologic:   CNII-XII intact, normal strength, sensation and reflexes    Throughout

## 2018-10-24 LAB — COMPREHENSIVE METABOLIC PANEL
ALBUMIN: 4.4 g/dL (ref 3.5–5.5)
ALT: 18 IU/L (ref 0–32)
AST: 20 IU/L (ref 0–40)
Albumin/Globulin Ratio: 1.8 (ref 1.2–2.2)
Alkaline Phosphatase: 46 IU/L (ref 39–117)
BUN / CREAT RATIO: 18 (ref 9–23)
BUN: 15 mg/dL (ref 6–24)
Bilirubin Total: 0.5 mg/dL (ref 0.0–1.2)
CO2: 21 mmol/L (ref 20–29)
CREATININE: 0.82 mg/dL (ref 0.57–1.00)
Calcium: 9.4 mg/dL (ref 8.7–10.2)
Chloride: 104 mmol/L (ref 96–106)
GFR calc Af Amer: 101 mL/min/{1.73_m2} (ref >59)
GFR calc non Af Amer: 88 mL/min/{1.73_m2} (ref >59)
Globulin, Total: 2.4 g/dL (ref 1.5–4.5)
Glucose: 88 mg/dL (ref 65–99)
Potassium: 4.4 mmol/L (ref 3.5–5.2)
Sodium: 139 mmol/L (ref 134–144)
TOTAL PROTEIN: 6.8 g/dL (ref 6.0–8.5)

## 2018-10-24 LAB — CBC WITH DIFFERENTIAL/PLATELET
BASOS ABS: 0.1 10*3/uL (ref 0.0–0.2)
Basos: 1 %
EOS (ABSOLUTE): 0.1 10*3/uL (ref 0.0–0.4)
Eos: 2 %
HEMOGLOBIN: 13.6 g/dL (ref 11.1–15.9)
Hematocrit: 40.2 % (ref 34.0–46.6)
IMMATURE GRANS (ABS): 0 10*3/uL (ref 0.0–0.1)
IMMATURE GRANULOCYTES: 0 %
LYMPHS: 25 %
Lymphocytes Absolute: 1.9 10*3/uL (ref 0.7–3.1)
MCH: 29.7 pg (ref 26.6–33.0)
MCHC: 33.8 g/dL (ref 31.5–35.7)
MCV: 88 fL (ref 79–97)
MONOCYTES: 10 %
Monocytes Absolute: 0.8 10*3/uL (ref 0.1–0.9)
Neutrophils Absolute: 4.7 10*3/uL (ref 1.4–7.0)
Neutrophils: 62 %
Platelets: 271 10*3/uL (ref 150–450)
RBC: 4.58 x10E6/uL (ref 3.77–5.28)
RDW: 12.8 % (ref 12.3–15.4)
WBC: 7.5 10*3/uL (ref 3.4–10.8)

## 2018-10-24 LAB — LIPID PANEL
Chol/HDL Ratio: 2.8 ratio (ref 0.0–4.4)
Cholesterol, Total: 163 mg/dL (ref 100–199)
HDL: 58 mg/dL (ref >39)
LDL Calculated: 84 mg/dL (ref 0–99)
TRIGLYCERIDES: 107 mg/dL (ref 0–149)
VLDL Cholesterol Cal: 21 mg/dL (ref 5–40)

## 2018-10-24 LAB — HIV ANTIBODY (ROUTINE TESTING W REFLEX): HIV Screen 4th Generation wRfx: NONREACTIVE

## 2018-10-24 LAB — VITAMIN D 25 HYDROXY (VIT D DEFICIENCY, FRACTURES): Vit D, 25-Hydroxy: 29.5 ng/mL — ABNORMAL LOW (ref 30.0–100.0)

## 2018-10-24 LAB — TSH: TSH: 2.09 u[IU]/mL (ref 0.450–4.500)

## 2018-10-24 LAB — HEMOGLOBIN A1C
Est. average glucose Bld gHb Est-mCnc: 108 mg/dL
HEMOGLOBIN A1C: 5.4 % (ref 4.8–5.6)

## 2018-10-24 LAB — HEPATITIS C ANTIBODY: Hep C Virus Ab: <0.1 s/co ratio (ref 0.0–0.9)

## 2018-10-24 LAB — T4, FREE: Free T4: 1.15 ng/dL (ref 0.82–1.77)

## 2018-11-02 ENCOUNTER — Ambulatory Visit: Payer: BC Managed Care – PPO | Admitting: Internal Medicine

## 2018-11-19 ENCOUNTER — Other Ambulatory Visit: Payer: Self-pay | Admitting: Internal Medicine

## 2018-11-20 NOTE — Telephone Encounter (Signed)
CY Please advise. Last OV: 11/2017 Pending OV: 12/2018. Appears patient has been receiving Rx from you since 2017 for occasional cold sores. Thanks.

## 2018-11-20 NOTE — Telephone Encounter (Signed)
Ok to refill x 1 year 

## 2018-11-28 ENCOUNTER — Encounter (HOSPITAL_COMMUNITY): Payer: Self-pay | Admitting: Emergency Medicine

## 2018-11-28 ENCOUNTER — Emergency Department (HOSPITAL_COMMUNITY)
Admission: EM | Admit: 2018-11-28 | Discharge: 2018-11-28 | Disposition: A | Discharge status: Home / Self Care | Payer: BC Managed Care – PPO | Attending: Emergency Medicine | Admitting: Emergency Medicine

## 2018-11-28 ENCOUNTER — Emergency Department (HOSPITAL_COMMUNITY): Payer: BC Managed Care – PPO

## 2018-11-28 ENCOUNTER — Other Ambulatory Visit: Payer: Self-pay

## 2018-11-28 DIAGNOSIS — Z79899 Other long term (current) drug therapy: Secondary | ICD-10-CM | POA: Diagnosis not present

## 2018-11-28 DIAGNOSIS — R0789 Other chest pain: Secondary | ICD-10-CM | POA: Insufficient documentation

## 2018-11-28 DIAGNOSIS — R079 Chest pain, unspecified: Secondary | ICD-10-CM

## 2018-11-28 DIAGNOSIS — J45909 Unspecified asthma, uncomplicated: Secondary | ICD-10-CM | POA: Diagnosis not present

## 2018-11-28 LAB — CBC
HCT: 39.8 % (ref 36.0–46.0)
Hemoglobin: 12.8 g/dL (ref 12.0–15.0)
MCH: 30 pg (ref 26.0–34.0)
MCHC: 32.2 g/dL (ref 30.0–36.0)
MCV: 93.2 fL (ref 80.0–100.0)
Platelets: 248 10*3/uL (ref 150–400)
RBC: 4.27 MIL/uL (ref 3.87–5.11)
RDW: 12.4 % (ref 11.5–15.5)
WBC: 11.5 10*3/uL — ABNORMAL HIGH (ref 4.0–10.5)
nRBC: 0 % (ref 0.0–0.2)

## 2018-11-28 LAB — POCT I-STAT TROPONIN I
Troponin i, poc: 0 ng/mL (ref 0.00–0.08)
Troponin i, poc: 0 ng/mL (ref 0.00–0.08)

## 2018-11-28 LAB — I-STAT BETA HCG BLOOD, ED (NOT ORDERABLE): I-stat hCG, quantitative: <5 m[IU]/mL (ref <5)

## 2018-11-28 LAB — BASIC METABOLIC PANEL
Anion gap: 8 (ref 5–15)
BUN: 22 mg/dL — ABNORMAL HIGH (ref 6–20)
CO2: 23 mmol/L (ref 22–32)
CREATININE: 0.72 mg/dL (ref 0.44–1.00)
Calcium: 9.2 mg/dL (ref 8.9–10.3)
Chloride: 108 mmol/L (ref 98–111)
GFR calc Af Amer: >60 mL/min (ref >60)
GFR calc non Af Amer: >60 mL/min (ref >60)
Glucose, Bld: 107 mg/dL — ABNORMAL HIGH (ref 70–99)
Potassium: 3.8 mmol/L (ref 3.5–5.1)
Sodium: 139 mmol/L (ref 135–145)

## 2018-11-28 MED ORDER — FAMOTIDINE 20 MG PO TABS: 20.0000 mg | ORAL_TABLET | Freq: Two times a day (BID) | ORAL | 0 refills | Status: DC

## 2018-11-28 MED ORDER — SODIUM CHLORIDE 0.9% FLUSH: 3.0000 mL | Freq: Once | INTRAVENOUS | Status: DC

## 2018-11-28 MED ORDER — LIDOCAINE VISCOUS HCL 2 % MT SOLN
15.0000 mL | Freq: Once | OROMUCOSAL | Status: AC
  Administered 2018-11-28: 15 mL via ORAL
  Filled 2018-11-28: qty 15

## 2018-11-28 MED ORDER — ALUM & MAG HYDROXIDE-SIMETH 200-200-20 MG/5ML PO SUSP
30.0000 mL | Freq: Once | ORAL | Status: AC
  Administered 2018-11-28: 30 mL via ORAL
  Filled 2018-11-28: qty 30

## 2018-11-28 MED ORDER — DICLOFENAC SODIUM 1 % TD GEL: 2.0000 g | Freq: Four times a day (QID) | TRANSDERMAL | 0 refills | Status: DC

## 2018-11-28 NOTE — ED Triage Notes (Signed)
Pt reports new onset of chest pain under left breast that radiates to back that started at midnight.

## 2018-11-28 NOTE — ED Notes (Signed)
Pt and visitor verbalized discharge instructions and follow up care. Alert and ambulatory  

## 2018-11-28 NOTE — ED Notes (Signed)
Bed: WA24 Expected date:  Expected time:  Means of arrival:  Comments: 

## 2018-11-28 NOTE — Discharge Instructions (Addendum)
Your work-up today was reassuring.  I suspect your symptoms are GI related, likely due to the diclofenac.   Would recommend to stop this, can switch to topical version. Can take tylenol for pain-- up to 2000mg  daily is fine.  Avoid NSAIDs (motrin, aleve, ibuprofen, naproxen, etc). Take the pepcid for at least 5-7 days, see if you are feeling back to normal.  If not, can continue for a few more days. Follow-up with your primary care doctor. Return to the ED for new or worsening symptoms.

## 2018-11-28 NOTE — ED Provider Notes (Signed)
[facility]-EMERGENCY DEPT Provider Note   CSN: [id number] Arrival date & time: 11/28/18  0159     History   Chief Complaint Chief Complaint  Patient presents with  . Chest Pain    HPI Morgan Wood is a 43 y.o. female.  The history is provided by the patient and medical records.  Chest Pain     43 year old female with history of seasonal allergies, headaches, anxiety, presenting to the ED with chest pain.  Reports this started around midnight and has been persistent since that time.  Pain midsternal in nature, is worse with lying flat, better with leaning forward.  States she does have a little bit of pain in her upper back.  She has noticed some increased pain with extremely deep breathing, no increased pain with normal breathing.  She denies any shortness of breath, diaphoresis, nausea, vomiting, or feelings of syncope.  She has no known cardiac history.  No significant family cardiac history.  She is not a smoker.  Patient reports she is not sure if this is acid reflux, but does have a history of digestive issues.  She was also recently started on diclofenac about 2 weeks ago for pain relief related to chronic tailbone issue.  Mother reports she did take this medication just prior to symptom onset.  Past Medical History:  Diagnosis Date  . Allergic rhinitis   . Allergy   . Asthma   . Bronchitis    1-2 episodes yearly  . Environmental allergies    rash,swelling in throat,itching  . Headache(784.0)   . Laryngitis    2-3episodes yearly    Patient Active Problem List   Diagnosis Date Noted  . Epicondylitis elbow, medial, right 11/08/2017  . Incomplete RBBB 10/01/2017  . Left anterior fascicular block (LAFB) 10/01/2017  . Sensation of chest tightness 10/01/2017  . GAD (generalized anxiety disorder) 09/19/2017  . Acute reaction to stress 09/19/2017  . Panic anxiety syndrome 09/19/2017  . Muscle spasm 03/10/2017  . Cervicalgia 03/10/2017  .  Dysmenorrhea 07/11/2016  . Goes for Allergy desensitization therapy 07/11/2016  . S/P correction of deviated nasal septum- chronic rhinitis and sinusitis 05/18/2016  . Environmental and seasonal allergies 05/18/2016  . h/o Abdominal pain, right upper quadrant 05/18/2016  . Neuropathy, cervical (radicular) 05/18/2016  . R shoulder tendinitis-  05/18/2016  . Constipation 06/16/2015  . Asthma, mild intermittent, well-controlled 04/29/2014  . Pain in right shin 05/03/2013  . Peroneal ganglion cyst 05/03/2013  . Chest pain 02/14/2013  . Allergic rhinitis due to pollen 03/14/2011    Past Surgical History:  Procedure Laterality Date  . bulging disc C3 C4  2006  . EUS  10/13/2012   Procedure: UPPER ENDOSCOPIC ULTRASOUND (EUS) LINEAR;  Surgeon: Theda BelfastPatrick D Hung, MD;  Location: [facility];  Service: Endoscopy;  Laterality: N/A;  . NASAL SEPTUM SURGERY  11/01/2002  . UPPER GASTROINTESTINAL ENDOSCOPY  11/13; 12/13  . WISDOM TOOTH EXTRACTION       OB History    Gravida  0   Para  0   Term  0   Preterm  0   AB  0   Living  0     SAB  0   TAB  0   Ectopic  0   Multiple  0   Live Births               Home Medications    Prior to Admission medications   Medication Sig Start Date End  Date Taking? Authorizing Provider  cetirizine-pseudoephedrine (ZYRTEC-D) 5-120 MG tablet Take 1 tablet by mouth 2 (two) times daily. Take EVERY DAY not PRN 05/18/16   Thomasene Lotpalski, Deborah, DO  FLUoxetine (PROZAC) 10 MG tablet 1 tablet for 10 days every month perimenstrual 02/27/18   Thomasene Lotpalski, Deborah, DO  levalbuterol [facility](XOPENEX HFA) 45 MCG/ACT inhaler Inhale 1-2 puffs into the lungs every 6 (six) hours as needed for wheezing or shortness of breath. prn 10/23/18   Opalski, Deborah, DO  Lidocaine, Anorectal, 5 % CREA Apply topically daily as needed.    [provider]  Multiple Vitamin (MULTIVITAMIN) capsule Take 1 capsule daily by mouth.    [provider]  valACYclovir (VALTREX) 1000  MG tablet TAKE 1 TABLET BY MOUTH EVERY DAY 11/20/18   Jetty DuhamelYoung, Clinton D, MD  Cetirizine HCl (ZYRTEC PO) Take by mouth as needed.   02/03/16  [provider]    Family History Family History  Problem Relation Age of Onset  . Hypertension Father   . Diabetes Other   . Heart disease Other   . Cancer Maternal Grandfather        skin cancer  . Heart attack Maternal Grandfather   . Emphysema Maternal Grandfather   . Parkinsonism Paternal Grandmother   . Heart attack Paternal Grandfather   . Cancer Maternal Grandmother     Social History Social History   Tobacco Use  . Smoking status: Never Smoker  . Smokeless tobacco: Never Used  Substance Use Topics  . Alcohol use: No  . Drug use: No     Allergies   Albuterol and Azithromycin   Review of Systems Review of Systems  Cardiovascular: Positive for chest pain.  All other systems reviewed and are negative.    Physical Exam Updated Vital Signs BP 120/72   Pulse 73   Temp 97.6 F (36.4 C) (Oral)   Resp 19   Ht 5\' 3"  (1.6 m)   Wt 53.5 kg   LMP 11/01/2018   SpO2 99%   BMI 20.90 kg/m   Physical Exam Vitals signs and nursing note reviewed.  Constitutional:      Appearance: She is well-developed.  HENT:     Head: Normocephalic and atraumatic.  Eyes:     Conjunctiva/sclera: Conjunctivae normal.     Pupils: Pupils are equal, round, and reactive to light.  Neck:     Musculoskeletal: Normal range of motion.  Cardiovascular:     Rate and Rhythm: Normal rate and regular rhythm.     Heart sounds: Normal heart sounds.  Pulmonary:     Effort: Pulmonary effort is normal.     Breath sounds: Normal breath sounds. No decreased breath sounds, wheezing or rhonchi.  Abdominal:     General: Bowel sounds are normal.     Palpations: Abdomen is soft.     Comments: Soft, nontender, no peritoneal signs  Musculoskeletal: Normal range of motion.  Skin:    General: Skin is warm and dry.  Neurological:     Mental Status: She  is alert and oriented to person, place, and time.      ED Treatments / Results  Labs (all labs ordered are listed, but only abnormal results are displayed) Labs Reviewed  BASIC METABOLIC PANEL - Abnormal; Notable for the following components:      Result Value   Glucose, Bld 107 (*)    BUN 22 (*)    All other components within normal limits  CBC - Abnormal; Notable for the following components:  WBC 11.5 (*)    All other components within normal limits  I-STAT TROPONIN, ED  I-STAT BETA HCG BLOOD, ED ([facility], [facility], [facility] ONLY)  POCT I-STAT TROPONIN I  I-STAT BETA HCG BLOOD, ED (NOT ORDERABLE)  I-STAT TROPONIN, ED  POCT I-STAT TROPONIN I    EKG EKG Interpretation  Date/Time:  Tuesday November 28 2018 02:07:19 EST Ventricular Rate:  80 PR Interval:    QRS Duration: 103 QT Interval:  368 QTC Calculation: 425 R Axis:   -67 Text Interpretation:  Sinus rhythm Short PR interval Left axis deviation Borderline T abnormalities, anterior leads Baseline wander in lead(s) V3 V4 V5 V6 No old tracing to compare Confirmed by Jacalyn Lefevre [phone]) on 11/28/2018 5:18:21 AM   Radiology Dg Chest 2 View  Result Date: 11/28/2018 CLINICAL DATA:  Sternal left-sided chest pain x2 hours. EXAM: CHEST - 2 VIEW COMPARISON:  Priors dating back 02/14/2013 FINDINGS: The heart size and mediastinal contours are within normal limits. No acute pulmonary consolidation, effusion or pneumothorax. Small nodular density is stable at the right base some which are likely related to pulmonary vessels seen on end. Given long-term stability, these appear to represent benign findings. The visualized skeletal structures are unremarkable. IMPRESSION: No active cardiopulmonary disease. Electronically Signed   By: Tollie Eth M.D.   On: 11/28/2018 02:43    Procedures Procedures (including critical care time)  Medications Ordered in ED Medications  sodium chloride flush (NS) 0.9 % injection 3 mL (has no administration in time  range)  alum & mag hydroxide-simeth (MAALOX/MYLANTA) 200-200-20 MG/5ML suspension 30 mL (30 mLs Oral Given 11/28/18 0427)    And  lidocaine (XYLOCAINE) 2 % viscous mouth solution 15 mL (15 mLs Oral Given 11/28/18 0427)     Initial Impression / Assessment and Plan / ED Course  I have reviewed the triage vital signs and the nursing notes.  Pertinent labs & imaging results that were available during my care of the patient were reviewed by me and considered in my medical decision making (see chart for details).  44 year old female here with chest pain.  Began around midnight has been persistent since that time.  Pain midsternal in nature without radiation.  Does feel little bit of pain in her back but unsure if this is related.  She has no known cardiac history, no family cardiac history and is not a smoker.  EKG is nonischemic.  Labs overall reassuring.  Chest x-ray is clear.  Patient does report recently starting diclofenac 2 weeks ago for chronic tailbone pain.  She does report having GI issues related to NSAIDs in the past.  Suspect this is likely etiology of her symptoms.  We will plan for GI cocktail and delta troponin.  Patient's delta troponin is negative.  She is feeling much improved after GI cocktail.  Vitals remained stable.  After negative evaluation, low suspicion for ACS, PE, dissection, acute cardiac event.  Symptoms seem more GI in nature, likely due to initiation of NSAID therapy.  Will have her stop this for now, can switch to topical voltaren.  Recommend pepcid for a few days, monitor diet for spicy/acidic foods.  Can switch to tylenol (advised on daily limitation of 2g) but avoid NSAIDs for now.  Close follow-up with PCP.  Return here for any new/acute changes.  Final Clinical Impressions(s) / ED Diagnoses   Final diagnoses:  Chest pain in adult    ED Discharge Orders         Ordered  11/28/18 0522    diclofenac sodium (VOLTAREN) 1 % GEL  4 times daily     11/28/18  0523    famotidine (PEPCID) 20 MG tablet  2 times daily     11/28/18 0523           Garlon Hatchet, PA-C 11/28/18 0530    Jacalyn Lefevre, MD 11/28/18 [phone]

## 2019-01-03 ENCOUNTER — Ambulatory Visit: Payer: BC Managed Care – PPO

## 2019-01-04 ENCOUNTER — Ambulatory Visit: Payer: BC Managed Care – PPO | Admitting: Internal Medicine

## 2019-01-04 ENCOUNTER — Encounter: Payer: Self-pay | Admitting: Internal Medicine

## 2019-01-04 VITALS — BP 116/70 | HR 102 | Temp 98.8°F | Ht 63.0 in | Wt 123.8 lb

## 2019-01-04 DIAGNOSIS — J111 Influenza due to unidentified influenza virus with other respiratory manifestations: Secondary | ICD-10-CM | POA: Diagnosis not present

## 2019-01-04 DIAGNOSIS — J4520 Mild intermittent asthma, uncomplicated: Secondary | ICD-10-CM | POA: Diagnosis not present

## 2019-01-04 DIAGNOSIS — J101 Influenza due to other identified influenza virus with other respiratory manifestations: Secondary | ICD-10-CM

## 2019-01-04 LAB — POCT INFLUENZA A/B
Influenza A, POC: POSITIVE — AB
Influenza B, POC: NEGATIVE

## 2019-01-04 MED ORDER — OSELTAMIVIR PHOSPHATE 75 MG PO CAPS: 75.0000 mg | ORAL_CAPSULE | Freq: Two times a day (BID) | ORAL | 0 refills | Status: DC

## 2019-01-04 NOTE — Patient Instructions (Signed)
Order- nasal flu swab  Script sent for Tamiflu  Ok to use Zinc lozenges like Cold-eze, Zicam  Ok to use otc cold and flu remedies  Stay well- hydrated

## 2019-01-04 NOTE — Assessment & Plan Note (Signed)
She has her routine med, which is, so far, sufficient. Discussed contact if needs more help.

## 2019-01-04 NOTE — Assessment & Plan Note (Signed)
Flu syndrome management reviewed- No work till better. Tamiflu. Symptom therapies as needed. Zinc lozenges. Supportive care- adequate hydration.

## 2019-01-04 NOTE — Progress Notes (Signed)
Patient ID: Morgan Wood, female    DOB: [birth year], 43 y.o.   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 Pt states she feels better than she did in November. Got prednisone taper for asthma exacerbation in November. PCP treated with Augmentin December 1 for sinusitis. Xopenex HFA, Arnuity Ellipta, Flonase, Zyrtec-D Still works as Chartered loss adjuster with associated exposures. GI has put her on Prilosec for heartburn, recognizing potential for airway involvement for reflux. Currently feeling nearly back to baseline after sustained chest tightness with respiratory infection in November. Never started Arnuity Ellipta. We don't remember why Singulair was stopped in the past unless not needed. Currently not aware of wheezing or sleep disturbance. Asks refill valacyclovir for occasional cold sores.  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 She did get flu shot. Yesterday onset of PNdrip, malaise, fever 100 deg, muscle aches, bitemporal HA. Some cough- scant white. No GI. CXR 11/28/2018-  IMPRESSION: No active cardiopulmonary disease.   ROS-see HPI + = positive Constitutional:   No-   weight loss, night sweats, +fevers, chills,  fatigue, lassitude. HEENT:   + headaches, difficulty swallowing, tooth/dental problems, +sore throat,       No-  sneezing, itching, ear ache, +nasal congestion, +post nasal drip,  CV:  No-   chest pain, orthopnea, PND, swelling in lower extremities, anasarca, dizziness, palpitations Resp: No-   shortness of breath with exertion or at rest.              No-   productive cough,  + non-productive cough,  No- coughing up of blood.              No-   change in color of mucus.  No- wheezing.   Skin: No-   rash or lesions. GI:  No-   heartburn, indigestion, abdominal pain, nausea, vomiting,  GU: . MS:  No-   joint pain or swelling.   Neuro-     nothing unusual Psych:  No- change in mood or affect. No depression or anxiety.  No memory loss.  Objective:   Physical Exam amb wf nad General- Alert, Oriented, Affect-appropriate, Distress- none acute, slender Skin- rash-none, lesions- none, excoriation- none Lymphadenopathy- none Head- atraumatic            Eyes- Gross vision intact, PERRLA, conjunctivae clear secretions            Ears- Hearing, canals-normal, TMs clear            Nose-  turbinate edema, no-Septal dev, polyps, erosion, perforation             Throat- Mallampati II , mucosa clear , drainage- none, tonsils- present Neck- flexible , trachea midline, no stridor , thyroid nl, carotid no bruit Chest - symmetrical excursion , unlabored           Heart/CV- RRR , no murmur , no gallop  , no rub, nl s1 s2  JVD- trace/ fills from above , edema- none, stasis changes- none, varices- none           Lung- clear to P&A, wheeze- none, cough- none , dullness-none, rub- none           Chest wall-  Abd-  Soft, no tenderness, with particular attention to left upper quadrant Extrem- cyanosis- none, clubbing, none, atrophy- none, strength- nl

## 2019-01-17 ENCOUNTER — Telehealth: Payer: Self-pay | Admitting: Internal Medicine

## 2019-01-17 MED ORDER — AMOXICILLIN 500 MG PO TABS: 500.0000 mg | ORAL_TABLET | Freq: Two times a day (BID) | ORAL | 0 refills | Status: DC

## 2019-01-17 NOTE — Telephone Encounter (Signed)
Offer amoxacillin 500 mg, # 14, 1 twice dialy  Ok for her to use Zyrtec-D   Combination antihistamine and decongestant she has used before.

## 2019-01-17 NOTE — Telephone Encounter (Signed)
Called and spoke with Patient.  Dr Maple Hudson recommendations given.  Understanding stated.  Prescription sent to requested pharmacy, [facility] [address].  Nothing further at this time.

## 2019-01-17 NOTE — Telephone Encounter (Signed)
Called and spoke with patient she stated that she was in a cold room on Monday morning and since Monday night she has had symptoms of bilateral ear pain and "fullness" she has a headache and head pressure, she has pain under her eyes and her cheek bones. Patient denies fever, no body aches and no chills. Patient has had some sneezing and congestion. No productive cough. Patient would like something to be called in. No recent travel or around anyone who has.   CY please advise, thank you.   Current Outpatient Medications on File Prior to Visit  Medication Sig Dispense Refill  . cetirizine-pseudoephedrine (ZYRTEC-D) 5-120 MG tablet Take 1 tablet by mouth 2 (two) times daily. Take EVERY DAY not PRN (Patient taking differently: Take 1 tablet by mouth daily. ) 28 tablet 1  . diclofenac sodium (VOLTAREN) 1 % GEL Apply 2 g topically 4 (four) times daily. 100 g 0  . famotidine (PEPCID) 20 MG tablet Take 1 tablet (20 mg total) by mouth 2 (two) times daily. 30 tablet 0  . levalbuterol (XOPENEX HFA) 45 MCG/ACT inhaler Inhale 1-2 puffs into the lungs every 6 (six) hours as needed for wheezing or shortness of breath. prn 1 Inhaler 2  . Multiple Vitamin (MULTIVITAMIN) capsule Take 1 capsule daily by mouth.    . oseltamivir (TAMIFLU) 75 MG capsule Take 1 capsule (75 mg total) by mouth 2 (two) times daily. 10 capsule 0  . valACYclovir (VALTREX) 1000 MG tablet TAKE 1 TABLET BY MOUTH EVERY DAY (Patient taking differently: Take 1,000 mg by mouth daily as needed (outbreak). ) 30 tablet 11  . [DISCONTINUED] Cetirizine HCl (ZYRTEC PO) Take by mouth as needed.      No current facility-administered medications on file prior to visit.

## 2019-03-12 ENCOUNTER — Ambulatory Visit: Payer: BC Managed Care – PPO | Admitting: Obstetrics & Gynecology

## 2019-04-18 ENCOUNTER — Telehealth: Payer: Self-pay | Admitting: Obstetrics & Gynecology

## 2019-04-18 NOTE — Telephone Encounter (Signed)
Patient has an appointment tomorrow 6/18. States she woke up with a cold sore this morning. No other symptoms. Typically gets them from being out in the sun. Did not want to come in without letting us know first.

## 2019-04-18 NOTE — Telephone Encounter (Signed)
Spoke with patient. Hx of HSV, current outbreak, takes valtrex. Covid 19 prescreen negative, precautions reviewed. Advised patient ok to keep AEX as scheduled for 04/19/19 with Dr. Sabra Heck.   Routing to provider for final review. Patient is agreeable to disposition. Will close encounter.

## 2019-04-19 ENCOUNTER — Other Ambulatory Visit (HOSPITAL_COMMUNITY)
Admission: RE | Admit: 2019-04-19 | Discharge: 2019-04-19 | Disposition: A | Discharge status: Home / Self Care | Payer: BC Managed Care – PPO | Source: Ambulatory Visit | Attending: Obstetrics & Gynecology | Admitting: Obstetrics & Gynecology

## 2019-04-19 ENCOUNTER — Ambulatory Visit: Payer: BC Managed Care – PPO | Admitting: Obstetrics & Gynecology

## 2019-04-19 ENCOUNTER — Encounter: Payer: Self-pay | Admitting: Obstetrics & Gynecology

## 2019-04-19 ENCOUNTER — Other Ambulatory Visit: Payer: Self-pay

## 2019-04-19 VITALS — BP 110/76 | HR 88 | Temp 97.8°F | Ht 63.5 in | Wt 118.0 lb

## 2019-04-19 DIAGNOSIS — Z124 Encounter for screening for malignant neoplasm of cervix: Secondary | ICD-10-CM

## 2019-04-19 DIAGNOSIS — Z01419 Encounter for gynecological examination (general) (routine) without abnormal findings: Secondary | ICD-10-CM | POA: Diagnosis not present

## 2019-04-19 NOTE — Progress Notes (Signed)
43 y.o. G0P0000 Single White or Caucasian female here for annual exam.  Had MRI on tailbone that did not show anything.  Was started on diclofenac for inflammation but had a reaction to it and ended up in the ER due to chest pain.  Cardiac work up was negative.  Diclofenac added as a sensitivity to list today.    Cycles are regular.  Flow lasts 4-5 days.  Has two heavier days.    Patient's last menstrual period was 04/13/2019 (exact date).          Sexually active: No.  The current method of family planning is abstinence.    Exercising: Yes.    walking Smoker:  no  Health Maintenance: Pap:  01/05/18 Neg   06/16/15 neg  History of abnormal Pap:  Yes, 2013 ASCUS MMG:  09/22/16 BIRADS1:neg  Colonoscopy:  Never BMD:   Never TDaP:  ~2015 Hep C testing: 10/23/18 neg  Screening Labs: PCP   reports that she has never smoked. She has never used smokeless tobacco. She reports that she does not drink alcohol or use drugs.  Past Medical History:  Diagnosis Date  . Allergic rhinitis   . Allergy   . Asthma   . Bronchitis    1-2 episodes yearly  . Environmental allergies    rash,swelling in throat,itching  . Headache(784.0)   . Laryngitis    2-3episodes yearly    Past Surgical History:  Procedure Laterality Date  . bulging disc C3 C4  2006  . EUS  10/13/2012   Procedure: UPPER ENDOSCOPIC ULTRASOUND (EUS) LINEAR;  Surgeon: Theda BelfastPatrick D Hung, MD;  Location: [facility];  Service: Endoscopy;  Laterality: N/A;  . NASAL SEPTUM SURGERY  11/01/2002  . UPPER GASTROINTESTINAL ENDOSCOPY  11/13; 12/13  . WISDOM TOOTH EXTRACTION      Current Outpatient Medications  Medication Sig Dispense Refill  . cetirizine-pseudoephedrine (ZYRTEC-D) 5-120 MG tablet Take 1 tablet by mouth 2 (two) times daily. Take EVERY DAY not PRN (Patient taking differently: Take 1 tablet by mouth daily. ) 28 tablet 1  . famotidine (PEPCID) 20 MG tablet Take 1 tablet (20 mg total) by mouth 2 (two) times daily. 30 tablet 0  .  levalbuterol (XOPENEX HFA) 45 MCG/ACT inhaler Inhale 1-2 puffs into the lungs every 6 (six) hours as needed for wheezing or shortness of breath. prn 1 Inhaler 2  . Multiple Vitamin (MULTIVITAMIN) capsule Take 1 capsule daily by mouth.    . valACYclovir (VALTREX) 1000 MG tablet TAKE 1 TABLET BY MOUTH EVERY DAY (Patient taking differently: Take 1,000 mg by mouth daily as needed (outbreak). ) 30 tablet 11   No current facility-administered medications for this visit.     Family History  Problem Relation Age of Onset  . Hypertension Father   . Diabetes Other   . Heart disease Other   . Cancer Maternal Grandfather        skin cancer  . Heart attack Maternal Grandfather   . Emphysema Maternal Grandfather   . Parkinsonism Paternal Grandmother   . Heart attack Paternal Grandfather   . Cancer Maternal Grandmother     Review of Systems  All other systems reviewed and are negative.   Exam:   BP 110/76   Pulse 88   Temp 97.8 F (36.6 C) (Temporal)   Ht 5' 3.5" (1.613 m)   Wt 118 lb (53.5 kg)   LMP 04/13/2019 (Exact Date)   BMI 20.57 kg/m    Height: 5' 3.5" (161.3 cm)  Ht Readings from Last 3 Encounters:  04/19/19 5' 3.5" (1.613 m)  01/04/19 5\' 3"  (1.6 m)  11/28/18 5\' 3"  (1.6 m)    General appearance: alert, cooperative and appears stated age Head: Normocephalic, without obvious abnormality, atraumatic Neck: no adenopathy, supple, symmetrical, trachea midline and thyroid normal to inspection and palpation Lungs: clear to auscultation bilaterally Breasts: normal appearance, no masses or tenderness Heart: regular rate and rhythm Abdomen: soft, non-tender; bowel sounds normal; no masses,  no organomegaly Extremities: extremities normal, atraumatic, no cyanosis or edema Skin: Skin color, texture, turgor normal. No rashes or lesions Lymph nodes: Cervical, supraclavicular, and axillary nodes normal. No abnormal inguinal nodes palpated Neurologic: Grossly normal   Pelvic: External  genitalia:  no lesions              Urethra:  normal appearing urethra with no masses, tenderness or lesions              Bartholins and Skenes: normal                 Vagina: normal appearing vagina with normal color and discharge, no lesions              Cervix: no lesions              Pap taken: Yes.   Bimanual Exam:  Uterus:  normal size, contour, position, consistency, mobility, non-tender              Adnexa: normal adnexa and no mass, fullness, tenderness               Rectovaginal: Confirms               Anus:  normal sphincter tone, no lesions  Chaperone was present for exam.  A:  Well Woman with normal exam Not SA H/O asthma Coccydynia Facial fever blisters Hemorrhoids  P:   Mammogram guidelines reviewed.  She is aware this is due.  Doing 3D MMGs due to breast density pap smear with HR HPV Lab work done with Dr. Raliegh Scarlet in December return annually or prn

## 2019-04-20 LAB — CYTOLOGY - PAP: Diagnosis: NEGATIVE

## 2019-05-03 ENCOUNTER — Other Ambulatory Visit: Payer: Self-pay | Admitting: Obstetrics & Gynecology

## 2019-05-03 DIAGNOSIS — Z1231 Encounter for screening mammogram for malignant neoplasm of breast: Secondary | ICD-10-CM

## 2019-06-19 ENCOUNTER — Other Ambulatory Visit: Payer: Self-pay

## 2019-06-19 ENCOUNTER — Ambulatory Visit
Admission: RE | Admit: 2019-06-19 | Discharge: 2019-06-19 | Disposition: A | Discharge status: Home / Self Care | Payer: BC Managed Care – PPO | Source: Ambulatory Visit | Attending: Obstetrics & Gynecology | Admitting: Obstetrics & Gynecology

## 2019-06-19 DIAGNOSIS — Z1231 Encounter for screening mammogram for malignant neoplasm of breast: Secondary | ICD-10-CM

## 2019-06-21 ENCOUNTER — Other Ambulatory Visit: Payer: Self-pay | Admitting: Obstetrics & Gynecology

## 2019-06-21 DIAGNOSIS — R928 Other abnormal and inconclusive findings on diagnostic imaging of breast: Secondary | ICD-10-CM

## 2019-06-27 ENCOUNTER — Ambulatory Visit
Admission: RE | Admit: 2019-06-27 | Discharge: 2019-06-27 | Disposition: A | Discharge status: Home / Self Care | Payer: BC Managed Care – PPO | Source: Ambulatory Visit | Attending: Obstetrics & Gynecology | Admitting: Obstetrics & Gynecology

## 2019-06-27 ENCOUNTER — Other Ambulatory Visit: Payer: Self-pay

## 2019-06-27 DIAGNOSIS — R928 Other abnormal and inconclusive findings on diagnostic imaging of breast: Secondary | ICD-10-CM

## 2019-09-25 ENCOUNTER — Encounter: Payer: Self-pay | Admitting: Pulmonary Disease

## 2019-09-25 ENCOUNTER — Telehealth: Payer: Self-pay | Admitting: Internal Medicine

## 2019-09-25 ENCOUNTER — Telehealth (INDEPENDENT_AMBULATORY_CARE_PROVIDER_SITE_OTHER): Payer: BC Managed Care – PPO | Admitting: Pulmonary Disease

## 2019-09-25 DIAGNOSIS — J3089 Other allergic rhinitis: Secondary | ICD-10-CM | POA: Diagnosis not present

## 2019-09-25 DIAGNOSIS — Z9889 Other specified postprocedural states: Secondary | ICD-10-CM | POA: Diagnosis not present

## 2019-09-25 DIAGNOSIS — J4520 Mild intermittent asthma, uncomplicated: Secondary | ICD-10-CM

## 2019-09-25 DIAGNOSIS — J301 Allergic rhinitis due to pollen: Secondary | ICD-10-CM

## 2019-09-25 MED ORDER — PREDNISONE 10 MG PO TABS: ORAL_TABLET | ORAL | 0 refills | Status: DC

## 2019-09-25 MED ORDER — DOXYCYCLINE HYCLATE 100 MG PO TABS: 100.0000 mg | ORAL_TABLET | Freq: Two times a day (BID) | ORAL | 0 refills | Status: DC

## 2019-09-25 NOTE — Assessment & Plan Note (Signed)
Plan: Continue Zyrtec Prednisone taper today Doxycycline Start nasal saline rinses twice daily Patient can consider Flonase or Astelin nasal spray after nasal saline rinses to help with chronic rhinitis symptoms Referral back to ear nose and throat

## 2019-09-25 NOTE — Assessment & Plan Note (Signed)
Plan: Continue Zyrtec Start nasal saline rinses twice daily Patient likely needs to consider Astelin nasal spray or Flonase nasal spray after nasal saline rinses to help with chronic rhinitis

## 2019-09-25 NOTE — Assessment & Plan Note (Signed)
Plan: Referral back to ear nose and throat, former patient of Dr. Ardis Hughs

## 2019-09-25 NOTE — Progress Notes (Signed)
Virtual Visit via Video Note  I connected with Morgan Wood on 09/25/19 at  3:30 PM EST by a video enabled telemedicine application and verified that I am speaking with the correct person using two identifiers.  Location: Patient: Home Provider: Office - [facility] - [address] [place], [address], [place], [place] [address]  I discussed the limitations of evaluation and management by telemedicine and the availability of in person appointments. The patient expressed understanding and agreed to proceed. I also discussed with the patient that there may be a patient responsible charge related to this service. The patient expressed understanding and agreed to proceed.  Patient consented to consult via telephone: Yes People present and their role in pt care: Pt   History of Present Illness:  43 year old female never smoker followed in our office for asthmatic bronchitis  Past medical history: Anxiety history of environmental and seasonal allergies, incomplete right bundle branch block, former patient of Dr. Christella Hartigan status post correction of deviated nasal septum, chronic rhinitis and sinusitis Smoking history: Never smoker Maintenance: None Patient of Dr. Maple Hudson  Immunization History  Administered Date(s) Administered  . H1N1 11/04/2008  . Influenza Split 09/06/2011  . Influenza Whole 09/01/2009, 09/07/2010, 09/01/2012  . Influenza,inj,Quad PF,6+ Mos 09/17/2013, 11/02/2017, 10/10/2018  . Tdap 02/19/2008    Social History   Tobacco Use  Smoking Status Never Smoker  Smokeless Tobacco Never Used     Chief complaint: Congestion, increased nasal drip  43 year old female never smoker followed in our office for asthmatic bronchitis.  Patient contacted our office on 09/25/2019 reporting increased nasal congestion, postnasal drip, occasional cough.  Patient is maintained on Zyrtec D daily.  She does not do nasal saline rinses.  She has a history of a prior surgical correction of  deviated nasal septum over 15 years ago.  Former patient of Dr. Christella Hartigan.  She does not have routine follow-up with the ear nose and throat.  She is wondering if she needs to reestablish with them.  She continues to have persistent chronic rhinitis symptoms.  She has a history of receiving allergy injections.  History of seasonal and environmental allergies.  She does report that she has a right-sided globus sensation occasionally.  It comes and goes.  She thinks this may be from postnasal drip.  She denies any issues swallowing or breathing.  She does have some occasional wheezing which she says is her baseline.  She does not have a maintenance inhaler.  She does continue to work for [employer].  She is currently remote teaching.  She does not have any known sick contacts that she is aware of.  Denies fevers, body aches, chills, loss of sense of smell or taste.    Observations/Objective:  Calm Able to speak in sentences No audible wheezing No visible distress No visible hypoxia No cough  Assessment and Plan:  Asthma, mild intermittent, well-controlled Plan: Doxycycline today Prednisone today Continue rescue inhaler as needed  Seasonal allergic rhinitis due to pollen Plan: Continue Zyrtec Prednisone taper today Doxycycline Start nasal saline rinses twice daily Patient can consider Flonase or Astelin nasal spray after nasal saline rinses to help with chronic rhinitis symptoms Referral back to ear nose and throat  Environmental and seasonal allergies Plan: Continue Zyrtec Start nasal saline rinses twice daily Patient likely needs to consider Astelin nasal spray or Flonase nasal spray after nasal saline rinses to help with chronic rhinitis  S/P correction of deviated nasal septum- chronic rhinitis and sinusitis Plan: Referral back  to ear nose and throat, former patient of Dr. Ardis Hughs   Follow Up Instructions:  Return in about 3 months (around 12/26/2019), or if  symptoms worsen or fail to improve, for Follow up with Dr. Annamaria Boots.    I discussed the assessment and treatment plan with the patient. The patient was provided an opportunity to ask questions and all were answered. The patient agreed with the plan and demonstrated an understanding of the instructions.   The patient was advised to call back or seek an in-person evaluation if the symptoms worsen or if the condition fails to improve as anticipated.  I provided 26 minutes of non-face-to-face time during this encounter.   Lauraine Rinne, NP

## 2019-09-25 NOTE — Assessment & Plan Note (Signed)
Plan: Doxycycline today Prednisone today Continue rescue inhaler as needed

## 2019-09-25 NOTE — Telephone Encounter (Signed)
Called the patient back and she stated this has been going on for several weeks. The congestion has been yellow with a little green in color to it. Has a runny nose, which has been clear. No fever, no SOB. Stated that she feels it more on the right side of throat then the left. The feeling in her throat will come and go.  Has the feeling in her throat regardless of body position or what she eats or drinks. If she drinks something hot it will resolve and then come back .Patient has been taking her allergy medication as directed.  Patient last visit was 01/04/19 with Dr. Annamaria Boots. Patient set up for video visit today at 3:30 with Wyn Quaker, NP.  Confirmed patient has mychart access and familiar with use of it. Advised for her to log in 10 - 15 mintues prior to 3:30 appointment time. Nothing further needed.

## 2019-09-25 NOTE — Patient Instructions (Addendum)
You were seen today by Coral Ceo, NP  for:   1. Seasonal allergic rhinitis due to pollen  - doxycycline (VIBRA-TABS) 100 MG tablet; Take 1 tablet (100 mg total) by mouth 2 (two) times daily.  Dispense: 14 tablet; Refill: 0 - predniSONE (DELTASONE) 10 MG tablet; 4 tabs for 2 days, then 3 tabs for 2 days, 2 tabs for 2 days, then 1 tab for 2 days, then stop  Dispense: 20 tablet; Refill: 0 - Ambulatory referral to ENT  Start nasal saline rinses twice daily  Can consider starting Flonase or Astelin nasal spray after nasal saline rinses to help with nasal congestion   2. Asthma, mild intermittent, well-controlled  - doxycycline (VIBRA-TABS) 100 MG tablet; Take 1 tablet (100 mg total) by mouth 2 (two) times daily.  Dispense: 14 tablet; Refill: 0 - predniSONE (DELTASONE) 10 MG tablet; 4 tabs for 2 days, then 3 tabs for 2 days, 2 tabs for 2 days, then 1 tab for 2 days, then stop  Dispense: 20 tablet; Refill: 0  Only use your albuterol as a rescue medication to be used if you can't catch your breath by resting or doing a relaxed purse lip breathing pattern.  - The less you use it, the better it will work when you need it. - Ok to use up to 2 puffs  every 4 hours if you must but call for immediate appointment if use goes up over your usual need - Don't leave home without it !!  (think of it like the spare tire for your car)   3. Environmental and seasonal allergies  - predniSONE (DELTASONE) 10 MG tablet; 4 tabs for 2 days, then 3 tabs for 2 days, 2 tabs for 2 days, then 1 tab for 2 days, then stop  Dispense: 20 tablet; Refill: 0  Start nasal saline rinses twice daily  Can consider starting Flonase or Astelin nasal spray after nasal saline rinses to help with nasal congestion  4. S/P correction of deviated nasal septum- chronic rhinitis and sinusitis  - Ambulatory referral to ENT   We recommend today:  Orders Placed This Encounter  Procedures  . Ambulatory referral to ENT    Referral  Priority:   Routine    Referral Type:   Consultation    Referral Reason:   Specialty Services Required    Requested Specialty:   Otolaryngology    Number of Visits Requested:   1   Orders Placed This Encounter  Procedures  . Ambulatory referral to ENT   Meds ordered this encounter  Medications  . doxycycline (VIBRA-TABS) 100 MG tablet    Sig: Take 1 tablet (100 mg total) by mouth 2 (two) times daily.    Dispense:  14 tablet    Refill:  0  . predniSONE (DELTASONE) 10 MG tablet    Sig: 4 tabs for 2 days, then 3 tabs for 2 days, 2 tabs for 2 days, then 1 tab for 2 days, then stop    Dispense:  20 tablet    Refill:  0    Follow Up:    Return in about 3 months (around 12/26/2019), or if symptoms worsen or fail to improve, for Follow up with Dr. Maple Hudson.   Please do your part to reduce the spread of COVID-19:      Reduce your risk of any infection  and COVID19 by using the similar precautions used for avoiding the common cold or flu:  Marland Kitchen Wash your hands often with  soap and warm water for at least 20 seconds.  If soap and water are not readily available, use an alcohol-based hand sanitizer with at least 60% alcohol.  . If coughing or sneezing, cover your mouth and nose by coughing or sneezing into the elbow areas of your shirt or coat, into a tissue or into your sleeve (not your hands). Langley Gauss A MASK when in public  . Avoid shaking hands with others and consider head nods or verbal greetings only. . Avoid touching your eyes, nose, or mouth with unwashed hands.  . Avoid close contact with people who are sick. . Avoid places or events with large numbers of people in one location, like concerts or sporting events. . If you have some symptoms but not all symptoms, continue to monitor at home and seek medical attention if your symptoms worsen. . If you are having a medical emergency, call 911.   [place] / e-Visit:  [email or web address]         [facility]: [facility]: [phone]                   [facility]: [phone]     It is flu season:   >>> Best ways to protect herself from the flu: Receive the yearly flu vaccine, practice good hand hygiene washing with soap and also using hand sanitizer when available, eat a nutritious meals, get adequate rest, hydrate appropriately   Please contact the office if your symptoms worsen or you have concerns that you are not improving.   Thank you for choosing [facility] for your healthcare, and for allowing [place] to partner with you on your healthcare journey. I am thankful to be able to provide care to you today.   Wyn Quaker FNP-C

## 2019-10-05 DIAGNOSIS — K219 Gastro-esophageal reflux disease without esophagitis: Secondary | ICD-10-CM | POA: Insufficient documentation

## 2020-01-03 ENCOUNTER — Encounter: Payer: Self-pay | Admitting: Internal Medicine

## 2020-01-03 ENCOUNTER — Other Ambulatory Visit: Payer: Self-pay

## 2020-01-03 ENCOUNTER — Ambulatory Visit: Payer: BC Managed Care – PPO | Admitting: Internal Medicine

## 2020-01-03 DIAGNOSIS — J4520 Mild intermittent asthma, uncomplicated: Secondary | ICD-10-CM | POA: Diagnosis not present

## 2020-01-03 DIAGNOSIS — K219 Gastro-esophageal reflux disease without esophagitis: Secondary | ICD-10-CM

## 2020-01-03 DIAGNOSIS — J301 Allergic rhinitis due to pollen: Secondary | ICD-10-CM | POA: Diagnosis not present

## 2020-01-03 MED ORDER — LEVALBUTEROL TARTRATE 45 MCG/ACT IN AERO: 1.0000 | INHALATION_SPRAY | Freq: Four times a day (QID) | RESPIRATORY_TRACT | 12 refills | Status: DC | PRN

## 2020-01-03 NOTE — Patient Instructions (Signed)
Script sent refilling Xopenex rescue inhaler  Ok to resume flonase and antihistamines when needed  Please call if we can help

## 2020-01-03 NOTE — Progress Notes (Signed)
Patient ID: Morgan Wood, female    DOB: [birth year], 44 y.o.   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 She did get flu shot. Yesterday onset of PNdrip, malaise, fever 100 deg, muscle aches, bitemporal HA. Some cough- scant white. No GI. CXR 11/28/2018-  IMPRESSION: No active cardiopulmonary disease.  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 Breathing is at patient baseline.    Asks Xopenex refill to keep available, but has done well through winter w/o exacerbation.  Controls rhinitis with saline rinse and can add flonase and antihistamines as needed seasonally.  Now on acid blocker. Discussed reflux precautions.    ROS-see HPI + = positive Constitutional:   No-   weight loss, night sweats, +fevers, chills, fatigue, lassitude. HEENT:   + headaches, difficulty swallowing, tooth/dental problems, +sore throat,       No-  sneezing, itching, ear ache, +nasal congestion, +post nasal drip,  CV:  No-   chest pain, orthopnea, PND, swelling in lower extremities, anasarca, dizziness, palpitations Resp: No-   shortness of breath with exertion or at rest.              No-   productive  cough,  + non-productive cough,  No- coughing up of blood.              No-   change in color of mucus.  No- wheezing.   Skin: No-   rash or lesions. GI:  No-   heartburn, indigestion, abdominal pain, nausea, vomiting,  GU: . MS:  No-   joint pain or swelling.   Neuro-     nothing unusual Psych:  No- change in mood or affect. No depression or anxiety.  No memory loss.  Objective:   Physical Exam amb wf nad General- Alert, Oriented, Affect-appropriate, Distress- none acute, slender Skin- rash-none, lesions- none, excoriation- none Lymphadenopathy- none Head- atraumatic            Eyes- Gross vision intact, PERRLA, conjunctivae clear secretions            Ears- Hearing, canals-normal, TMs clear            Nose-  turbinate edema, no-Septal dev, polyps, erosion, perforation             Throat- Mallampati II , mucosa clear , drainage- none, tonsils- present Neck- flexible , trachea midline, no stridor , thyroid nl, carotid no bruit Chest - symmetrical excursion , unlabored           Heart/CV- RRR , no murmur , no gallop  , no rub, nl s1 s2                            JVD- trace/ fills from above , edema- none, stasis changes- none, varices- none           Lung- clear to P&A, wheeze- none, cough-  none , dullness-none, rub- none           Chest wall-  Abd-  Soft, no tenderness, with particular attention to left upper quadrant Extrem- cyanosis- none, clubbing, none, atrophy- none, strength- nl

## 2020-01-15 DIAGNOSIS — K219 Gastro-esophageal reflux disease without esophagitis: Secondary | ICD-10-CM | POA: Insufficient documentation

## 2020-01-15 NOTE — Assessment & Plan Note (Signed)
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 Plan- continue flonase/ antihistamine when needed

## 2020-01-15 NOTE — Assessment & Plan Note (Signed)
Emphasized role of acid blocker and reflux precautions.

## 2020-01-15 NOTE — Assessment & Plan Note (Signed)
Remains well controlled with rare need for rescue inhaler

## 2020-03-13 ENCOUNTER — Emergency Department (HOSPITAL_COMMUNITY)
Admission: EM | Admit: 2020-03-13 | Discharge: 2020-03-13 | Disposition: A | Discharge status: Home / Self Care | Payer: BC Managed Care – PPO | Attending: Emergency Medicine | Admitting: Emergency Medicine

## 2020-03-13 ENCOUNTER — Emergency Department (HOSPITAL_COMMUNITY): Payer: BC Managed Care – PPO

## 2020-03-13 ENCOUNTER — Other Ambulatory Visit: Payer: Self-pay

## 2020-03-13 ENCOUNTER — Encounter (HOSPITAL_COMMUNITY): Payer: Self-pay

## 2020-03-13 DIAGNOSIS — Z79899 Other long term (current) drug therapy: Secondary | ICD-10-CM | POA: Insufficient documentation

## 2020-03-13 DIAGNOSIS — N201 Calculus of ureter: Secondary | ICD-10-CM | POA: Insufficient documentation

## 2020-03-13 DIAGNOSIS — R109 Unspecified abdominal pain: Secondary | ICD-10-CM | POA: Diagnosis present

## 2020-03-13 DIAGNOSIS — J45909 Unspecified asthma, uncomplicated: Secondary | ICD-10-CM | POA: Diagnosis not present

## 2020-03-13 LAB — BASIC METABOLIC PANEL
Anion gap: 8 (ref 5–15)
BUN: 23 mg/dL — ABNORMAL HIGH (ref 6–20)
CO2: 25 mmol/L (ref 22–32)
Calcium: 9.2 mg/dL (ref 8.9–10.3)
Chloride: 106 mmol/L (ref 98–111)
Creatinine, Ser: 0.96 mg/dL (ref 0.44–1.00)
GFR calc Af Amer: >60 mL/min (ref >60)
GFR calc non Af Amer: >60 mL/min (ref >60)
Glucose, Bld: 125 mg/dL — ABNORMAL HIGH (ref 70–99)
Potassium: 3.7 mmol/L (ref 3.5–5.1)
Sodium: 139 mmol/L (ref 135–145)

## 2020-03-13 LAB — URINALYSIS, ROUTINE W REFLEX MICROSCOPIC
Bilirubin Urine: NEGATIVE
Glucose, UA: NEGATIVE mg/dL
Ketones, ur: NEGATIVE mg/dL
Leukocytes,Ua: NEGATIVE
Nitrite: NEGATIVE
Protein, ur: 30 mg/dL — AB
RBC / HPF: >50 RBC/hpf — ABNORMAL HIGH (ref 0–5)
Specific Gravity, Urine: 1.017 (ref 1.005–1.030)
pH: 7 (ref 5.0–8.0)

## 2020-03-13 LAB — CBC
HCT: 38 % (ref 36.0–46.0)
Hemoglobin: 12.5 g/dL (ref 12.0–15.0)
MCH: 30 pg (ref 26.0–34.0)
MCHC: 32.9 g/dL (ref 30.0–36.0)
MCV: 91.1 fL (ref 80.0–100.0)
Platelets: 248 10*3/uL (ref 150–400)
RBC: 4.17 MIL/uL (ref 3.87–5.11)
RDW: 12 % (ref 11.5–15.5)
WBC: 10.6 10*3/uL — ABNORMAL HIGH (ref 4.0–10.5)
nRBC: 0 % (ref 0.0–0.2)

## 2020-03-13 LAB — I-STAT BETA HCG BLOOD, ED (MC, WL, AP ONLY): I-stat hCG, quantitative: <5 m[IU]/mL (ref <5)

## 2020-03-13 MED ORDER — HYDROCODONE-ACETAMINOPHEN 5-325 MG PO TABS: 1.0000 | ORAL_TABLET | Freq: Four times a day (QID) | ORAL | 0 refills | Status: DC | PRN

## 2020-03-13 MED ORDER — TAMSULOSIN HCL 0.4 MG PO CAPS: 0.4000 mg | ORAL_CAPSULE | Freq: Every day | ORAL | 0 refills | Status: AC

## 2020-03-13 MED ORDER — ONDANSETRON 4 MG PO TBDP
4.0000 mg | ORAL_TABLET | Freq: Three times a day (TID) | ORAL | 0 refills | Status: DC | PRN
Start: 2020-03-13 — End: 2020-04-21

## 2020-03-13 MED ORDER — SODIUM CHLORIDE 0.9 % IV BOLUS
1000.0000 mL | Freq: Once | INTRAVENOUS | Status: AC
  Administered 2020-03-13: 1000 mL via INTRAVENOUS

## 2020-03-13 NOTE — ED Triage Notes (Signed)
Per ems: Pt coming in c/o right flank pain that has been ongoing for a week. Pain was a 10 and is now a 5. Endorses feeling of increased urination as well    150 mcg of fentanyl given by ems

## 2020-03-13 NOTE — Discharge Instructions (Addendum)
Your CT scan showed a 4 mm stone on the right side. Please follow up with [facility] regarding your stone.   I have prescribed medications for you to take to help with your symptoms.  Please take the Flomax as prescribed - this helps you urinate to flush out the stone. Please increase your water intake with this medication as they work in tandem.  Take the zofran as needed for nausea.  Take the pain medication as needed for severe pain. I would recommend taking Ibuprofen first and if you have breakthrough pain to take the narcotic at that time.   Return to the ED for any worsening symptoms including worsening pain, fevers > 100.4, inability to urinate.

## 2020-03-13 NOTE — ED Provider Notes (Signed)
[facility] DEPT Provider Note   CSN: [id number] Arrival date & time: 03/13/20  0002     History Chief Complaint  Patient presents with   Flank Pain    Morgan Wood is a 44 y.o. female who presents to the ED today with complaint of sudden onset, constant, improving, R flank pain that began around 10 PM tonight.  States it slowly began wrapping around to her right lower abdomen.  Patient reports she had similar pain about a week ago however it was not this severe.  He also complains of some difficulty urinating.  She was given 150 mcg of fentanyl in route with improvement in pain.  Denies fevers, chills, nausea, vomiting, diarrhea, constipation, dysuria, urinary frequency, vaginal discharge, pelvic pain, any other associated symptoms.  The history is provided by the patient and medical records.       Past Medical History:  Diagnosis Date   Allergic rhinitis    Allergy    Asthma    Bronchitis    1-2 episodes yearly   Environmental allergies    rash,swelling in throat,itching   Headache(784.0)    Laryngitis    2-3episodes yearly    Patient Active Problem List   Diagnosis Date Noted   GERD (gastroesophageal reflux disease) 01/15/2020   Influenza A 01/04/2019   Epicondylitis elbow, medial, right 11/08/2017   Incomplete RBBB 10/01/2017   Left anterior fascicular block (LAFB) 10/01/2017   GAD (generalized anxiety disorder) 09/19/2017   Acute reaction to stress 09/19/2017   Panic anxiety syndrome 09/19/2017   Muscle spasm 03/10/2017   Cervicalgia 03/10/2017   Dysmenorrhea 07/11/2016   Goes for Allergy desensitization therapy 07/11/2016   S/P correction of deviated nasal septum- chronic rhinitis and sinusitis 05/18/2016   Environmental and seasonal allergies 05/18/2016   h/o Abdominal pain, right upper quadrant 05/18/2016   Neuropathy, cervical (radicular) 05/18/2016   R shoulder tendinitis-  05/18/2016    Constipation 06/16/2015   Asthma, mild intermittent, well-controlled 04/29/2014   Pain in right shin 05/03/2013   Peroneal ganglion cyst 05/03/2013   Seasonal allergic rhinitis due to pollen 03/14/2011    Past Surgical History:  Procedure Laterality Date   bulging disc C3 C4  2006   EUS  10/13/2012   Procedure: UPPER ENDOSCOPIC ULTRASOUND (EUS) LINEAR;  Surgeon: Beryle Beams, MD;  Location: [facility];  Service: Endoscopy;  Laterality: N/A;   NASAL SEPTUM SURGERY  11/01/2002   UPPER GASTROINTESTINAL ENDOSCOPY  11/13; 12/13   WISDOM TOOTH EXTRACTION       OB History    Gravida  0   Para  0   Term  0   Preterm  0   AB  0   Living  0     SAB  0   TAB  0   Ectopic  0   Multiple  0   Live Births              Family History  Problem Relation Age of Onset   Hypertension Father    Diabetes Other    Heart disease Other    Cancer Maternal Grandfather        skin cancer   Heart attack Maternal Grandfather    Emphysema Maternal Grandfather    Parkinsonism Paternal Grandmother    Heart attack Paternal Grandfather    Cancer Maternal Grandmother     Social History   Tobacco Use   Smoking status: Never Smoker   Smokeless tobacco: Never Used  Substance Use Topics   Alcohol use: No   Drug use: No    Home Medications Prior to Admission medications   Medication Sig Start Date End Date Taking? Authorizing Provider  cetirizine (ZYRTEC) 10 MG tablet Take 10 mg by mouth daily.   Yes [provider]  levalbuterol Pauline Aus HFA) 45 MCG/ACT inhaler Inhale 1-2 puffs into the lungs every 6 (six) hours as needed for wheezing or shortness of breath. prn 01/03/20  Yes Young, Clinton D, MD  cetirizine-pseudoephedrine (ZYRTEC-D) 5-120 MG tablet Take 1 tablet by mouth 2 (two) times daily. Take EVERY DAY not PRN Patient not taking: Reported on 03/13/2020 05/18/16   Thomasene Lot, DO  famotidine (PEPCID) 20 MG tablet Take 1 tablet (20 mg total)  by mouth 2 (two) times daily. Patient not taking: Reported on 03/13/2020 11/28/18   Garlon Hatchet, PA-C  HYDROcodone-acetaminophen (NORCO/VICODIN) 5-325 MG tablet Take 1 tablet by mouth every 6 (six) hours as needed for severe pain. 03/13/20   Hyman Hopes, Javaughn Opdahl, PA-C  ondansetron (ZOFRAN ODT) 4 MG disintegrating tablet Take 1 tablet (4 mg total) by mouth every 8 (eight) hours as needed for nausea or vomiting. 03/13/20   Hyman Hopes, Maddie Brazier, PA-C  tamsulosin (FLOMAX) 0.4 MG CAPS capsule Take 1 capsule (0.4 mg total) by mouth daily for 7 days. 03/13/20 03/20/20  Tanda Rockers, PA-C  valACYclovir (VALTREX) 1000 MG tablet TAKE 1 TABLET BY MOUTH EVERY DAY Patient not taking: No sig reported 11/20/18   Jetty Duhamel D, MD    Allergies    Albuterol, Diclofenac, and Azithromycin  Review of Systems   Review of Systems  Constitutional: Negative for chills and fever.  Gastrointestinal: Positive for abdominal pain. Negative for constipation, diarrhea, nausea and vomiting.  Genitourinary: Positive for difficulty urinating and flank pain.  All other systems reviewed and are negative.   Physical Exam Updated Vital Signs BP 111/69 (BP Location: Right Arm)    Pulse 80    Temp (!) 97.5 F (36.4 C) (Oral)    Resp 18    Ht 5\' 3"  (1.6 m)    Wt 56.2 kg    SpO2 100%    BMI 21.97 kg/m   Physical Exam Vitals and nursing note reviewed.  Constitutional:      Appearance: She is not ill-appearing.  HENT:     Head: Normocephalic and atraumatic.  Eyes:     Conjunctiva/sclera: Conjunctivae normal.  Cardiovascular:     Rate and Rhythm: Normal rate and regular rhythm.     Pulses: Normal pulses.  Pulmonary:     Effort: Pulmonary effort is normal.     Breath sounds: Normal breath sounds. No wheezing, rhonchi or rales.  Abdominal:     Palpations: Abdomen is soft.     Tenderness: There is abdominal tenderness. There is no guarding or rebound.     Comments: Soft, + mild suprapubic abdominal TTP, +BS throughout, no  r/g/r, neg murphy's, neg mcburney's, mild right CVA TTP   Musculoskeletal:     Cervical back: Neck supple.  Skin:    General: Skin is warm and dry.  Neurological:     Mental Status: She is alert.     ED Results / Procedures / Treatments   Labs (all labs ordered are listed, but only abnormal results are displayed) Labs Reviewed  URINALYSIS, ROUTINE W REFLEX MICROSCOPIC - Abnormal; Notable for the following components:      Result Value   APPearance HAZY (*)    Hgb urine dipstick LARGE (*)  Protein, ur 30 (*)    RBC / HPF >50 (*)    Bacteria, UA RARE (*)    All other components within normal limits  BASIC METABOLIC PANEL - Abnormal; Notable for the following components:   Glucose, Bld 125 (*)    BUN 23 (*)    All other components within normal limits  CBC - Abnormal; Notable for the following components:   WBC 10.6 (*)    All other components within normal limits  I-STAT BETA HCG BLOOD, ED ([facility], [facility], [facility] ONLY)    EKG None  Radiology CT Renal Stone Study  Result Date: 03/13/2020 CLINICAL DATA:  Right flank pain for 1 week, microhematuria EXAM: CT ABDOMEN AND PELVIS WITHOUT CONTRAST TECHNIQUE: Multidetector CT imaging of the abdomen and pelvis was performed following the standard protocol without IV contrast. COMPARISON:  03/01/2014 FINDINGS: Lower chest: No acute pleural or parenchymal lung disease. Hepatobiliary: No focal liver abnormality is seen. No gallstones, gallbladder wall thickening, or biliary dilatation. Pancreas: Unremarkable. No pancreatic ductal dilatation or surrounding inflammatory changes. Spleen: Normal in size without focal abnormality. Adrenals/Urinary Tract: There is a 4 mm obstructing calculus at the right UVJ, with mild right-sided obstructive uropathy. The left kidney is unremarkable. The bladder is normal. The adrenals are unremarkable. Stomach/Bowel: No bowel obstruction or ileus. No bowel wall thickening or inflammatory change. Normal appendix right lower  quadrant. Vascular/Lymphatic: No significant vascular findings are present. No enlarged abdominal or pelvic lymph nodes. Reproductive: Uterus and bilateral adnexa are unremarkable. Other: No abdominal wall hernia or abnormality. No abdominopelvic ascites. Musculoskeletal: No acute or destructive bony lesions. Reconstructed images demonstrate no additional findings. IMPRESSION: 1. Right-sided obstructive uropathy related to a 4 mm right UVJ calculus. 2. Otherwise unremarkable exam. Electronically Signed   By: Sharlet Salina M.D.   On: 03/13/2020 02:32    Procedures Procedures (including critical care time)  Medications Ordered in ED Medications  sodium chloride 0.9 % bolus 1,000 mL (1,000 mLs Intravenous New Bag/Given 03/13/20 0104)    ED Course  I have reviewed the triage vital signs and the nursing notes.  Pertinent labs & imaging results that were available during my care of the patient were reviewed by me and considered in my medical decision making (see chart for details).    MDM Rules/Calculators/A&P                      44 year old female presents to the ED today complaining of right flank pain radiating into lower abdomen that began earlier today.  Similar symptoms 1 week ago.  No history of kidney stones in the past.  Given 150 grams fentanyl in route.  On arrival to the ED patient is afebrile, nontachycardic nontachypneic.  She appears to be in no acute distress.  Reports her pain is currently under control.  Will obtain abdominal labs, UA reassess.  Fluids given.  Will hold off on additional pain medication at this time.   CBC with very mild leukocytosis 10.6. Doubt related to infectious etiology.  BMP with glucose 125. BUN 23. Creatinine 0.96.  U/A with large hgb and > 50 Rbcs per HPF. Suspicious for kidney stone at this time. Pt without hx of same. Will obtain CT renal stone study.   CT renal stone study with 4 mm stone at UVJ with mild obstructive uropathy.  IMPRESSION:  1.  Right-sided obstructive uropathy related to a 4 mm right UVJ  calculus.  2. Otherwise unremarkable exam.   On reassessment  pt still pain free. Receiving fluids at this time. Has been able to urinate in the ED without difficulty. Will discharge after fluids with zofran, short course of pain meds, and flomax. Pt to follow up with [facility]. She is in agreement with plan and will be stable for discharge after fluids.    Final Clinical Impression(s) / ED Diagnoses Final diagnoses:  Right flank pain  Ureterolithiasis    Rx / DC Orders ED Discharge Orders         Ordered    tamsulosin (FLOMAX) 0.4 MG CAPS capsule  Daily     03/13/20 0308    HYDROcodone-acetaminophen (NORCO/VICODIN) 5-325 MG tablet  Every 6 hours PRN     03/13/20 0308    ondansetron (ZOFRAN ODT) 4 MG disintegrating tablet  Every 8 hours PRN     03/13/20 0308           Discharge Instructions     Your CT scan showed a 4 mm stone on the right side. Please follow up with [facility] regarding your stone.   I have prescribed medications for you to take to help with your symptoms.  Please take the Flomax as prescribed - this helps you urinate to flush out the stone. Please increase your water intake with this medication as they work in tandem.  Take the zofran as needed for nausea.  Take the pain medication as needed for severe pain. I would recommend taking Ibuprofen first and if you have breakthrough pain to take the narcotic at that time.   Return to the ED for any worsening symptoms including worsening pain, fevers > 100.4, inability to urinate.        Tanda Rockers, PA-C 03/13/20 0310    Molpus, Jonny Ruiz, MD 03/13/20 [phone]

## 2020-03-23 ENCOUNTER — Encounter (HOSPITAL_COMMUNITY): Payer: Self-pay

## 2020-03-23 ENCOUNTER — Other Ambulatory Visit: Payer: Self-pay

## 2020-03-23 ENCOUNTER — Emergency Department (HOSPITAL_COMMUNITY): Payer: BC Managed Care – PPO

## 2020-03-23 ENCOUNTER — Emergency Department (HOSPITAL_COMMUNITY)
Admission: EM | Admit: 2020-03-23 | Discharge: 2020-03-24 | Disposition: A | Discharge status: Home / Self Care | Payer: BC Managed Care – PPO | Source: Home / Self Care | Attending: Emergency Medicine | Admitting: Emergency Medicine

## 2020-03-23 DIAGNOSIS — F411 Generalized anxiety disorder: Secondary | ICD-10-CM | POA: Diagnosis present

## 2020-03-23 DIAGNOSIS — N201 Calculus of ureter: Secondary | ICD-10-CM | POA: Insufficient documentation

## 2020-03-23 DIAGNOSIS — R112 Nausea with vomiting, unspecified: Secondary | ICD-10-CM

## 2020-03-23 DIAGNOSIS — R1031 Right lower quadrant pain: Secondary | ICD-10-CM | POA: Insufficient documentation

## 2020-03-23 DIAGNOSIS — Z79899 Other long term (current) drug therapy: Secondary | ICD-10-CM | POA: Diagnosis not present

## 2020-03-23 DIAGNOSIS — N200 Calculus of kidney: Secondary | ICD-10-CM

## 2020-03-23 DIAGNOSIS — J4520 Mild intermittent asthma, uncomplicated: Secondary | ICD-10-CM | POA: Diagnosis present

## 2020-03-23 DIAGNOSIS — Z881 Allergy status to other antibiotic agents status: Secondary | ICD-10-CM | POA: Diagnosis not present

## 2020-03-23 DIAGNOSIS — Z20822 Contact with and (suspected) exposure to covid-19: Secondary | ICD-10-CM | POA: Diagnosis not present

## 2020-03-23 DIAGNOSIS — R109 Unspecified abdominal pain: Secondary | ICD-10-CM

## 2020-03-23 DIAGNOSIS — K219 Gastro-esophageal reflux disease without esophagitis: Secondary | ICD-10-CM | POA: Diagnosis not present

## 2020-03-23 DIAGNOSIS — N132 Hydronephrosis with renal and ureteral calculous obstruction: Secondary | ICD-10-CM | POA: Diagnosis not present

## 2020-03-23 DIAGNOSIS — M1990 Unspecified osteoarthritis, unspecified site: Secondary | ICD-10-CM | POA: Diagnosis not present

## 2020-03-23 DIAGNOSIS — J45909 Unspecified asthma, uncomplicated: Secondary | ICD-10-CM | POA: Diagnosis not present

## 2020-03-23 LAB — CBC WITH DIFFERENTIAL/PLATELET
Abs Immature Granulocytes: 0.03 10*3/uL (ref 0.00–0.07)
Basophils Absolute: 0.1 10*3/uL (ref 0.0–0.1)
Basophils Relative: 1 %
Eosinophils Absolute: 0.1 10*3/uL (ref 0.0–0.5)
Eosinophils Relative: 1 %
HCT: 40 % (ref 36.0–46.0)
Hemoglobin: 13 g/dL (ref 12.0–15.0)
Immature Granulocytes: 0 %
Lymphocytes Relative: 13 %
Lymphs Abs: 1.4 10*3/uL (ref 0.7–4.0)
MCH: 30.2 pg (ref 26.0–34.0)
MCHC: 32.5 g/dL (ref 30.0–36.0)
MCV: 93 fL (ref 80.0–100.0)
Monocytes Absolute: 1 10*3/uL (ref 0.1–1.0)
Monocytes Relative: 9 %
Neutro Abs: 8.2 10*3/uL — ABNORMAL HIGH (ref 1.7–7.7)
Neutrophils Relative %: 76 %
Platelets: 206 10*3/uL (ref 150–400)
RBC: 4.3 MIL/uL (ref 3.87–5.11)
RDW: 12.2 % (ref 11.5–15.5)
WBC: 10.8 10*3/uL — ABNORMAL HIGH (ref 4.0–10.5)
nRBC: 0 % (ref 0.0–0.2)

## 2020-03-23 LAB — COMPREHENSIVE METABOLIC PANEL
ALT: 16 U/L (ref 0–44)
AST: 36 U/L (ref 15–41)
Albumin: 4.1 g/dL (ref 3.5–5.0)
Alkaline Phosphatase: 39 U/L (ref 38–126)
Anion gap: 10 (ref 5–15)
BUN: 22 mg/dL — ABNORMAL HIGH (ref 6–20)
CO2: 19 mmol/L — ABNORMAL LOW (ref 22–32)
Calcium: 9.3 mg/dL (ref 8.9–10.3)
Chloride: 109 mmol/L (ref 98–111)
Creatinine, Ser: 1.13 mg/dL — ABNORMAL HIGH (ref 0.44–1.00)
GFR calc Af Amer: >60 mL/min (ref >60)
GFR calc non Af Amer: 59 mL/min — ABNORMAL LOW (ref >60)
Glucose, Bld: 110 mg/dL — ABNORMAL HIGH (ref 70–99)
Potassium: 4.2 mmol/L (ref 3.5–5.1)
Sodium: 138 mmol/L (ref 135–145)
Total Bilirubin: 1.1 mg/dL (ref 0.3–1.2)
Total Protein: 6.9 g/dL (ref 6.5–8.1)

## 2020-03-23 LAB — URINALYSIS, ROUTINE W REFLEX MICROSCOPIC
Bilirubin Urine: NEGATIVE
Glucose, UA: NEGATIVE mg/dL
Hgb urine dipstick: NEGATIVE
Ketones, ur: 80 mg/dL — AB
Leukocytes,Ua: NEGATIVE
Nitrite: NEGATIVE
Protein, ur: NEGATIVE mg/dL
Specific Gravity, Urine: 1.015 (ref 1.005–1.030)
pH: 8 (ref 5.0–8.0)

## 2020-03-23 LAB — I-STAT BETA HCG BLOOD, ED (MC, WL, AP ONLY): I-stat hCG, quantitative: <5 m[IU]/mL (ref <5)

## 2020-03-23 MED ORDER — TAMSULOSIN HCL 0.4 MG PO CAPS: 0.4000 mg | ORAL_CAPSULE | Freq: Every day | ORAL | 0 refills | Status: AC

## 2020-03-23 MED ORDER — KETOROLAC TROMETHAMINE 15 MG/ML IJ SOLN
15.0000 mg | Freq: Once | INTRAMUSCULAR | Status: AC
  Administered 2020-03-23: 15 mg via INTRAVENOUS
  Filled 2020-03-23: qty 1

## 2020-03-23 MED ORDER — ONDANSETRON HCL 4 MG/2ML IJ SOLN
4.0000 mg | Freq: Once | INTRAMUSCULAR | Status: AC
  Administered 2020-03-23: 4 mg via INTRAVENOUS
  Filled 2020-03-23: qty 2

## 2020-03-23 MED ORDER — SODIUM CHLORIDE 0.9 % IV BOLUS
1000.0000 mL | Freq: Once | INTRAVENOUS | Status: AC
  Administered 2020-03-23: 1000 mL via INTRAVENOUS

## 2020-03-23 MED ORDER — MORPHINE SULFATE (PF) 4 MG/ML IV SOLN
4.0000 mg | Freq: Once | INTRAVENOUS | Status: AC
  Administered 2020-03-23: 4 mg via INTRAVENOUS
  Filled 2020-03-23: qty 1

## 2020-03-23 NOTE — Discharge Instructions (Addendum)
Your laboratory results were within normal limits aside from your creatinine function which was mildly elevated today at 1.13.  Your CT is unchanged and continues to show a 4 mm right renal stone.  Discussed follow-up with [facility], please take your pain medication as prescribed, you may also take Zofran to help with your nausea along with Flomax to help with stone passage.  If you experience any fever, worsening pain please return to the emergency department.

## 2020-03-23 NOTE — ED Triage Notes (Signed)
Pt BIB [facility] for R flank pain. Hx of kidney stones. She was seen 2 weeks ago and has still not passed this stone. Has not used any of her prescribed pain medication until today. Reports that she took a hydrocodone at 6p without relief. Endorses nausea.

## 2020-03-23 NOTE — ED Provider Notes (Signed)
[facility]-EMERGENCY DEPT Provider Note   CSN: [id number] Arrival date & time: 03/23/20  2002     History Chief Complaint  Patient presents with  . Flank Pain    Morgan Wood is a 44 y.o. female.  44 y.o female with a PMH of kidney stones sent to the ED with a chief complaint of right flank pain x1 week.  Patient was seen about a week ago in the ED for the similar complaint, reports pain has been constant, feels like it is worsening.  Symptoms exacerbated this morning, has taken 1 hydrocodone at 6 PM without improvement in her symptoms.  Does report 2 episodes of nonbloody, nonbilious emesis.  States she finished the medication which according to her records is likely Flomax, pain is worsened with movement.  Alleviated with holding of her right abdomen.  No fevers, hematuria, chest pain, shortness of breath.   The history is provided by the patient and medical records.  Flank Pain This is a recurrent problem. The current episode started 3 to 5 hours ago. The problem occurs constantly. The problem has been gradually worsening. Associated symptoms include abdominal pain. Pertinent negatives include no chest pain, no headaches and no shortness of breath. Nothing aggravates the symptoms. She has tried acetaminophen for the symptoms.       Past Medical History:  Diagnosis Date  . Allergic rhinitis   . Allergy   . Asthma   . Bronchitis    1-2 episodes yearly  . Environmental allergies    rash,swelling in throat,itching  . Headache(784.0)   . Laryngitis    2-3episodes yearly    Patient Active Problem List   Diagnosis Date Noted  . Renal stone 03/23/2020  . Hydronephrosis with renal and ureteral calculus obstruction 03/23/2020  . GERD (gastroesophageal reflux disease) 01/15/2020  . Influenza A 01/04/2019  . Epicondylitis elbow, medial, right 11/08/2017  . Incomplete RBBB 10/01/2017  . Left anterior fascicular block (LAFB) 10/01/2017  . GAD  (generalized anxiety disorder) 09/19/2017  . Acute reaction to stress 09/19/2017  . Panic anxiety syndrome 09/19/2017  . Muscle spasm 03/10/2017  . Cervicalgia 03/10/2017  . Dysmenorrhea 07/11/2016  . Goes for Allergy desensitization therapy 07/11/2016  . S/P correction of deviated nasal septum- chronic rhinitis and sinusitis 05/18/2016  . Environmental and seasonal allergies 05/18/2016  . h/o Abdominal pain, right upper quadrant 05/18/2016  . Neuropathy, cervical (radicular) 05/18/2016  . R shoulder tendinitis-  05/18/2016  . Constipation 06/16/2015  . Asthma, mild intermittent, well-controlled 04/29/2014  . Pain in right shin 05/03/2013  . Peroneal ganglion cyst 05/03/2013  . Seasonal allergic rhinitis due to pollen 03/14/2011    Past Surgical History:  Procedure Laterality Date  . bulging disc C3 C4  2006  . EUS  10/13/2012   Procedure: UPPER ENDOSCOPIC ULTRASOUND (EUS) LINEAR;  Surgeon: Theda Belfast, MD;  Location: [facility] ENDOSCOPY;  Service: Endoscopy;  Laterality: N/A;  . NASAL SEPTUM SURGERY  11/01/2002  . UPPER GASTROINTESTINAL ENDOSCOPY  11/13; 12/13  . WISDOM TOOTH EXTRACTION       OB History    Gravida  0   Para  0   Term  0   Preterm  0   AB  0   Living  0     SAB  0   TAB  0   Ectopic  0   Multiple  0   Live Births  Family History  Problem Relation Age of Onset  . Hypertension Father   . Diabetes Other   . Heart disease Other   . Cancer Maternal Grandfather        skin cancer  . Heart attack Maternal Grandfather   . Emphysema Maternal Grandfather   . Parkinsonism Paternal Grandmother   . Heart attack Paternal Grandfather   . Cancer Maternal Grandmother     Social History   Tobacco Use  . Smoking status: Never Smoker  . Smokeless tobacco: Never Used  Substance Use Topics  . Alcohol use: No  . Drug use: No    Home Medications Prior to Admission medications   Medication Sig Start Date End Date Taking? Authorizing  Provider  cetirizine (ZYRTEC) 10 MG tablet Take 10 mg by mouth daily.   Yes [provider]  cholecalciferol (VITAMIN D3) 25 MCG (1000 UNIT) tablet Take 1,000 Units by mouth daily.   Yes [provider]  ELDERBERRY PO Take 1 tablet by mouth daily.   Yes [provider]  HYDROcodone-acetaminophen (NORCO/VICODIN) 5-325 MG tablet Take 1 tablet by mouth every 6 (six) hours as needed for severe pain. 03/13/20  Yes Venter, Margaux, PA-C  Multiple Vitamin (MULTIVITAMIN WITH MINERALS) TABS tablet Take 1 tablet by mouth daily.   Yes [provider]  omeprazole (PRILOSEC) 40 MG capsule Take 40 mg by mouth daily.   Yes [provider]  valACYclovir (VALTREX) 1000 MG tablet TAKE 1 TABLET BY MOUTH EVERY DAY Patient taking differently: Take 1,000 mg by mouth daily.  11/20/18  Yes Young, Joni Fears D, MD  cetirizine-pseudoephedrine (ZYRTEC-D) 5-120 MG tablet Take 1 tablet by mouth 2 (two) times daily. Take EVERY DAY not PRN Patient not taking: Reported on 03/13/2020 05/18/16   Thomasene Lot, DO  famotidine (PEPCID) 20 MG tablet Take 1 tablet (20 mg total) by mouth 2 (two) times daily. Patient not taking: Reported on 03/13/2020 11/28/18   Garlon Hatchet, PA-C  levalbuterol [facility] HFA) 45 MCG/ACT inhaler Inhale 1-2 puffs into the lungs every 6 (six) hours as needed for wheezing or shortness of breath. prn Patient not taking: Reported on 03/23/2020 01/03/20   Waymon Budge, MD  ondansetron (ZOFRAN ODT) 4 MG disintegrating tablet Take 1 tablet (4 mg total) by mouth every 8 (eight) hours as needed for nausea or vomiting. Patient not taking: Reported on 03/23/2020 03/13/20   Tanda Rockers, PA-C  tamsulosin (FLOMAX) 0.4 MG CAPS capsule Take 1 capsule (0.4 mg total) by mouth daily for 14 days. 03/23/20 04/06/20  Claude Manges, PA-C    Allergies    Albuterol, Diclofenac, and Azithromycin  Review of Systems   Review of Systems  Constitutional: Negative for fever.  HENT:  Negative for sinus pressure and sore throat.   Respiratory: Negative for shortness of breath.   Cardiovascular: Negative for chest pain.  Gastrointestinal: Positive for abdominal pain, nausea and vomiting. Negative for blood in stool, constipation and diarrhea.  Genitourinary: Positive for flank pain. Negative for difficulty urinating and hematuria.  Musculoskeletal: Negative for back pain.  Skin: Negative for pallor.  Neurological: Negative for headaches.  All other systems reviewed and are negative.   Physical Exam Updated Vital Signs BP 113/65 (BP Location: Left Arm)   Pulse 68   Temp 97.6 F (36.4 C) (Oral)   Resp 19   Ht 5\' 3"  (1.6 m)   Wt 56.2 kg   SpO2 100%   BMI 21.97 kg/m   Physical Exam Vitals and nursing note  reviewed.  Constitutional:      Comments: Appears very uncomfortable.  HENT:     Head: Normocephalic and atraumatic.     Nose: Nose normal.     Mouth/Throat:     Mouth: Mucous membranes are moist.  Eyes:     Pupils: Pupils are equal, round, and reactive to light.  Cardiovascular:     Rate and Rhythm: Normal rate.  Pulmonary:     Effort: Pulmonary effort is normal.     Breath sounds: No wheezing.  Abdominal:     General: Abdomen is flat.     Tenderness: There is abdominal tenderness. There is right CVA tenderness. There is no left CVA tenderness.     Comments: Abdomen is soft, tender to palpation along the right lower quadrant.  Right CVA tenderness.  Musculoskeletal:     Cervical back: Normal range of motion and neck supple.  Skin:    General: Skin is warm and dry.  Neurological:     Mental Status: She is alert and oriented to person, place, and time.     ED Results / Procedures / Treatments   Labs (all labs ordered are listed, but only abnormal results are displayed) Labs Reviewed  URINALYSIS, ROUTINE W REFLEX MICROSCOPIC - Abnormal; Notable for the following components:      Result Value   APPearance CLOUDY (*)    Ketones, ur 80 (*)     All other components within normal limits  CBC WITH DIFFERENTIAL/PLATELET - Abnormal; Notable for the following components:   WBC 10.8 (*)    Neutro Abs 8.2 (*)    All other components within normal limits  COMPREHENSIVE METABOLIC PANEL - Abnormal; Notable for the following components:   CO2 19 (*)    Glucose, Bld 110 (*)    BUN 22 (*)    Creatinine, Ser 1.13 (*)    GFR calc non Af Amer 59 (*)    All other components within normal limits  HCG, SERUM, QUALITATIVE  I-STAT BETA HCG BLOOD, ED ([facility], [facility], [facility] ONLY)    EKG None  Radiology CT Renal Stone Study  Result Date: 03/23/2020 CLINICAL DATA:  Right flank pain. EXAM: CT ABDOMEN AND PELVIS WITHOUT CONTRAST TECHNIQUE: Multidetector CT imaging of the abdomen and pelvis was performed following the standard protocol without IV contrast. COMPARISON:  Mar 13, 2020 FINDINGS: Lower chest: No acute abnormality. Hepatobiliary: No focal liver abnormality is seen. No gallstones, gallbladder wall thickening, or biliary dilatation. Pancreas: Unremarkable. No pancreatic ductal dilatation or surrounding inflammatory changes. Spleen: Normal in size without focal abnormality. Adrenals/Urinary Tract: Adrenal glands are unremarkable. The left kidney is normal in appearance, without renal calculi, focal lesion, or hydronephrosis. A 4 mm obstructing renal stone is seen at the right UVJ with mild right-sided hydronephrosis and hydroureter. Positioning of the obstructing renal stone is stable when compared to the prior study. Bladder is unremarkable. Stomach/Bowel: Stomach is within normal limits. Appendix appears normal. No evidence of bowel wall thickening, distention, or inflammatory changes. Vascular/Lymphatic: No significant vascular findings are present. No enlarged abdominal or pelvic lymph nodes. Reproductive: Uterus and bilateral adnexa are unremarkable. Other: No abdominal wall hernia or abnormality. No abdominopelvic ascites. Musculoskeletal: No acute or  significant osseous findings. IMPRESSION: 4 mm obstructing renal stone at the right UVJ with mild right-sided hydronephrosis and hydroureter. Electronically Signed   By: Aram Candelahaddeus  Houston M.D.   On: 03/23/2020 21:53    Procedures Procedures (including critical care time)  Medications Ordered in ED Medications  ondansetron (  ZOFRAN) injection 4 mg (has no administration in time range)  HYDROcodone-acetaminophen (NORCO/VICODIN) 5-325 MG per tablet 1 tablet (has no administration in time range)  morphine 4 MG/ML injection 4 mg (4 mg Intravenous Given 03/23/20 2125)  sodium chloride 0.9 % bolus 1,000 mL (0 mLs Intravenous Stopped 03/23/20 2350)  ondansetron (ZOFRAN) injection 4 mg (4 mg Intravenous Given 03/23/20 2125)  ketorolac (TORADOL) 15 MG/ML injection 15 mg (15 mg Intravenous Given 03/23/20 2318)  ondansetron (ZOFRAN) injection 4 mg (4 mg Intravenous Given 03/23/20 2318)  ketorolac (TORADOL) 15 MG/ML injection 15 mg (15 mg Intravenous Given 03/23/20 2348)    ED Course  I have reviewed the triage vital signs and the nursing notes.  Pertinent labs & imaging results that were available during my care of the patient were reviewed by me and considered in my medical decision making (see chart for details).  Clinical Course as of Mar 24 18  Sun Mar 23, 2020  2224 Slightly increased from previous  Creatinine(!): 1.13 [JS]    Clinical Course User Index [JS] Janeece Fitting, PA-C   MDM Rules/Calculators/A&P  She with a past medical history of kidney stones presents to the ED with complaints of right-sided flank pain x1 week.  Patient was originally seen in the ED diagnosed with a kidney stone, sent home on Flomax along with hydrocodone which she completed Flomax therapy.  She reports sudden onset of worsening pain along the right flank.  He can 1 hydrocodone without improvement in her symptoms.  During primary evaluation patient appears very uncomfortable, actively vomiting during her patient  encounter.  Tachypneic in the 20s.  Blood pressure is stable.  She reports she has not been running any fevers, has not noted any blood in her urine.  A repeat blood work was obtained, creatinine level is worsening since previous at 1.13, left these are within normal limits.  No electrolyte abnormality.  CBC remarkable for mild leukocytosis of 10.8, suspect this is likely due to the vomiting episode.  Hemoglobin is stable.  hCG is negative.  UA appears to be cloudy with some ketones but no nitrites or leukocytes.  CT Renal showed: 4 mm obstructing renal stone at the right UVJ with mild right-sided  hydronephrosis and hydroureter.   Patient received Zofran, morphine, fluids to help with symptoms.  11:09 PM patient reevaluated by me, reports no improvement in her symptoms after morphine.  Will attempt Toradol.  As patient has had the same pain for the past week.  Will consult urology for further recommendations.  11:23 PM  Spoke to On call urology Sharlot Gowda, who recommended additional 15 mg of Toradol, patient may also go home on additional Flomax.  She has completed a week of Flomax and he is aware of this.  Creatinine clearance is acceptable.  11:50 PM discussed results of CT imaging with patient at length, we discussed treatment with continuous therapy of Flomax, she reports improvement in her pain after Toradol, will give additional 50 mg dose.  She is to call [facility] in the morning.  She does report she has current pain medication at home along with nausea medication.  12:18 AM patient reassessed by me continues to have pain alone the right lower quadrant, we will switch now to oral therapy in order to have patient likely discharge.  She also endorses some nausea.  Provided with more Zofran.  Patient is stable for discharge.   Portions of this note were generated with [employer]. Dictation errors may  occur despite best attempts at proofreading.  Final Clinical  Impression(s) / ED Diagnoses Final diagnoses:  Right flank pain  Non-intractable vomiting with nausea, unspecified vomiting type    Rx / DC Orders ED Discharge Orders         Ordered    tamsulosin (FLOMAX) 0.4 MG CAPS capsule  Daily     03/23/20 2327           Claude Manges, PA-C 03/24/20 0040    Charlynne Pander, MD 03/24/20 [phone]

## 2020-03-24 ENCOUNTER — Inpatient Hospital Stay (HOSPITAL_COMMUNITY): Payer: BC Managed Care – PPO | Admitting: Anesthesiology

## 2020-03-24 ENCOUNTER — Encounter (HOSPITAL_COMMUNITY)
Admission: RE | Disposition: A | Discharge status: Home / Self Care | Payer: Self-pay | Source: Ambulatory Visit | Attending: Urology

## 2020-03-24 ENCOUNTER — Inpatient Hospital Stay (HOSPITAL_COMMUNITY): Payer: BC Managed Care – PPO

## 2020-03-24 ENCOUNTER — Encounter (HOSPITAL_COMMUNITY): Payer: Self-pay | Admitting: Urology

## 2020-03-24 ENCOUNTER — Other Ambulatory Visit: Payer: Self-pay | Admitting: Urology

## 2020-03-24 ENCOUNTER — Ambulatory Visit (HOSPITAL_COMMUNITY)
Admission: RE | Admit: 2020-03-24 | Discharge: 2020-03-24 | Disposition: A | Discharge status: Home / Self Care | Payer: BC Managed Care – PPO | Source: Ambulatory Visit | Attending: Urology | Admitting: Urology

## 2020-03-24 DIAGNOSIS — Z20822 Contact with and (suspected) exposure to covid-19: Secondary | ICD-10-CM | POA: Insufficient documentation

## 2020-03-24 DIAGNOSIS — M1990 Unspecified osteoarthritis, unspecified site: Secondary | ICD-10-CM | POA: Insufficient documentation

## 2020-03-24 DIAGNOSIS — N132 Hydronephrosis with renal and ureteral calculous obstruction: Secondary | ICD-10-CM | POA: Insufficient documentation

## 2020-03-24 DIAGNOSIS — J45909 Unspecified asthma, uncomplicated: Secondary | ICD-10-CM | POA: Insufficient documentation

## 2020-03-24 DIAGNOSIS — Z79899 Other long term (current) drug therapy: Secondary | ICD-10-CM | POA: Insufficient documentation

## 2020-03-24 DIAGNOSIS — K219 Gastro-esophageal reflux disease without esophagitis: Secondary | ICD-10-CM | POA: Insufficient documentation

## 2020-03-24 DIAGNOSIS — Z881 Allergy status to other antibiotic agents status: Secondary | ICD-10-CM | POA: Insufficient documentation

## 2020-03-24 HISTORY — PX: CYSTOSCOPY/URETEROSCOPY/HOLMIUM LASER/STENT PLACEMENT: SHX6546

## 2020-03-24 LAB — SARS CORONAVIRUS 2 BY RT PCR (HOSPITAL ORDER, PERFORMED IN ~~LOC~~ HOSPITAL LAB): SARS Coronavirus 2: NEGATIVE

## 2020-03-24 SURGERY — CYSTOSCOPY/URETEROSCOPY/HOLMIUM LASER/STENT PLACEMENT
Anesthesia: General | Laterality: Right

## 2020-03-24 MED ORDER — FENTANYL CITRATE (PF) 100 MCG/2ML IJ SOLN
INTRAMUSCULAR | Status: DC | PRN
  Administered 2020-03-24: 25 ug via INTRAVENOUS

## 2020-03-24 MED ORDER — SODIUM CHLORIDE 0.9 % IR SOLN
Status: DC | PRN
  Administered 2020-03-24: 3000 mL

## 2020-03-24 MED ORDER — HYDROCODONE-ACETAMINOPHEN 5-325 MG PO TABS
1.0000 | ORAL_TABLET | Freq: Once | ORAL | Status: AC
  Administered 2020-03-24: 1 via ORAL
  Filled 2020-03-24: qty 1

## 2020-03-24 MED ORDER — ONDANSETRON HCL 4 MG/2ML IJ SOLN
INTRAMUSCULAR | Status: AC
  Filled 2020-03-24: qty 2

## 2020-03-24 MED ORDER — FENTANYL CITRATE (PF) 100 MCG/2ML IJ SOLN
INTRAMUSCULAR | Status: AC
  Administered 2020-03-24: 50 ug via INTRAVENOUS
  Filled 2020-03-24: qty 2

## 2020-03-24 MED ORDER — PROMETHAZINE HCL 25 MG/ML IJ SOLN: 6.2500 mg | INTRAMUSCULAR | Status: DC | PRN

## 2020-03-24 MED ORDER — CEPHALEXIN 500 MG PO CAPS: 500.0000 mg | ORAL_CAPSULE | Freq: Once | ORAL | 0 refills | Status: AC

## 2020-03-24 MED ORDER — PROPOFOL 10 MG/ML IV BOLUS
INTRAVENOUS | Status: DC | PRN
  Administered 2020-03-24: 150 mg via INTRAVENOUS

## 2020-03-24 MED ORDER — OXYCODONE HCL 5 MG PO TABS: 5.0000 mg | ORAL_TABLET | Freq: Once | ORAL | Status: DC | PRN

## 2020-03-24 MED ORDER — CHLORHEXIDINE GLUCONATE 0.12 % MT SOLN
15.0000 mL | Freq: Once | OROMUCOSAL | Status: AC
  Administered 2020-03-24: 15 mL via OROMUCOSAL

## 2020-03-24 MED ORDER — FENTANYL CITRATE (PF) 100 MCG/2ML IJ SOLN: 25.0000 ug | INTRAMUSCULAR | Status: DC | PRN

## 2020-03-24 MED ORDER — SODIUM CHLORIDE 0.9 % IV SOLN
2.0000 g | INTRAVENOUS | Status: AC
  Administered 2020-03-24: 2 g via INTRAVENOUS
  Filled 2020-03-24: qty 20

## 2020-03-24 MED ORDER — ONDANSETRON HCL 4 MG/2ML IJ SOLN
4.0000 mg | Freq: Once | INTRAMUSCULAR | Status: AC
  Administered 2020-03-24: 4 mg via INTRAVENOUS
  Filled 2020-03-24: qty 2

## 2020-03-24 MED ORDER — FENTANYL CITRATE (PF) 100 MCG/2ML IJ SOLN: 50.0000 ug | INTRAMUSCULAR | Status: AC

## 2020-03-24 MED ORDER — ONDANSETRON HCL 4 MG/2ML IJ SOLN
INTRAMUSCULAR | Status: DC | PRN
  Administered 2020-03-24: 4 mg via INTRAVENOUS

## 2020-03-24 MED ORDER — DEXAMETHASONE SODIUM PHOSPHATE 10 MG/ML IJ SOLN
INTRAMUSCULAR | Status: DC | PRN
  Administered 2020-03-24: 10 mg via INTRAVENOUS

## 2020-03-24 MED ORDER — MIDAZOLAM HCL 2 MG/2ML IJ SOLN
INTRAMUSCULAR | Status: AC
  Filled 2020-03-24: qty 2

## 2020-03-24 MED ORDER — MIDAZOLAM HCL 5 MG/5ML IJ SOLN
INTRAMUSCULAR | Status: DC | PRN
  Administered 2020-03-24: 2 mg via INTRAVENOUS

## 2020-03-24 MED ORDER — OXYCODONE HCL 5 MG/5ML PO SOLN: 5.0000 mg | Freq: Once | ORAL | Status: DC | PRN

## 2020-03-24 MED ORDER — 0.9 % SODIUM CHLORIDE (POUR BTL) OPTIME
TOPICAL | Status: DC | PRN
  Administered 2020-03-24: 1000 mL

## 2020-03-24 MED ORDER — LACTATED RINGERS IV SOLN: INTRAVENOUS | Status: DC

## 2020-03-24 MED ORDER — LIDOCAINE 2% (20 MG/ML) 5 ML SYRINGE
INTRAMUSCULAR | Status: DC | PRN
  Administered 2020-03-24: 60 mg via INTRAVENOUS

## 2020-03-24 MED ORDER — DEXAMETHASONE SODIUM PHOSPHATE 10 MG/ML IJ SOLN
INTRAMUSCULAR | Status: AC
  Filled 2020-03-24: qty 1

## 2020-03-24 MED ORDER — PHENAZOPYRIDINE HCL 200 MG PO TABS
200.0000 mg | ORAL_TABLET | Freq: Three times a day (TID) | ORAL | 0 refills | Status: DC | PRN
Start: 2020-03-24 — End: 2020-04-21

## 2020-03-24 MED ORDER — FENTANYL CITRATE (PF) 100 MCG/2ML IJ SOLN
INTRAMUSCULAR | Status: AC
  Filled 2020-03-24: qty 2

## 2020-03-24 MED ORDER — OXYBUTYNIN CHLORIDE 5 MG PO TABS: 5.0000 mg | ORAL_TABLET | Freq: Three times a day (TID) | ORAL | 0 refills | Status: DC | PRN

## 2020-03-24 MED ORDER — LIDOCAINE 2% (20 MG/ML) 5 ML SYRINGE
INTRAMUSCULAR | Status: AC
  Filled 2020-03-24: qty 5

## 2020-03-24 SURGICAL SUPPLY — 18 items
BAG URO CATCHER STRL LF (MISCELLANEOUS) ×2 IMPLANT
BASKET ZERO TIP NITINOL 2.4FR (BASKET) ×1 IMPLANT
BSKT STON RTRVL ZERO TP 2.4FR (BASKET) ×1
CATH URET 5FR 28IN OPEN ENDED (CATHETERS) ×3 IMPLANT
CLOTH BEACON ORANGE TIMEOUT ST (SAFETY) ×2 IMPLANT
DRSG TEGADERM 2-3/8X2-3/4 SM (GAUZE/BANDAGES/DRESSINGS) ×2 IMPLANT
EXTRACTOR STONE 1.7FRX115CM (UROLOGICAL SUPPLIES) IMPLANT
GLOVE BIO SURGEON STRL SZ 6.5 (GLOVE) ×2 IMPLANT
GOWN STRL REUS W/TWL LRG LVL3 (GOWN DISPOSABLE) ×2 IMPLANT
GUIDEWIRE STR DUAL SENSOR (WIRE) ×2 IMPLANT
KIT TURNOVER KIT A (KITS) IMPLANT
MANIFOLD NEPTUNE II (INSTRUMENTS) ×2 IMPLANT
SHEATH URETERAL 12FRX28CM (UROLOGICAL SUPPLIES) IMPLANT
SHEATH URETERAL 12FRX35CM (MISCELLANEOUS) IMPLANT
STENT URET 6FRX24 CONTOUR (STENTS) ×1 IMPLANT
TRAY CYSTO PACK (CUSTOM PROCEDURE TRAY) ×2 IMPLANT
TUBING CONNECTING 10 (TUBING) ×2 IMPLANT
TUBING UROLOGY SET (TUBING) ×2 IMPLANT

## 2020-03-24 NOTE — Transfer of Care (Signed)
Immediate Anesthesia Transfer of Care Note  Patient: Morgan Wood  Procedure(s) Performed: CYSTOSCOPY/URETEROSCOPY/HOLMIUM LASER/STENT PLACEMENT, RETROGRADE PYLEGRAM. (Right )  Patient Location: PACU  Anesthesia Type:General  Level of Consciousness: awake, drowsy and responds to stimulation  Airway & Oxygen Therapy: Patient Spontanous Breathing and Patient connected to face mask oxygen  Post-op Assessment: Report given to RN and Post -op Vital signs reviewed and stable  Post vital signs: Reviewed and stable  Last Vitals:  Vitals Value Taken Time  BP 107/75 03/24/20 1758  Temp    Pulse 90 03/24/20 1759  Resp 19 03/24/20 1759  SpO2 100 % 03/24/20 1759  Vitals shown include unvalidated device data.  Last Pain:  Vitals:   03/24/20 1700  TempSrc:   PainSc: 9       Patients Stated Pain Goal: 4 (03/24/20 1700)  Complications: No apparent anesthesia complications

## 2020-03-24 NOTE — Interval H&P Note (Signed)
History and Physical Interval Note:  03/24/2020 4:27 PM  Morgan Wood  has presented today for surgery, with the diagnosis of RIGHT RENAL CALCULI.  The various methods of treatment have been discussed with the patient and family. After consideration of risks, benefits and other options for treatment, the patient has consented to  Procedure(s): CYSTOSCOPY/URETEROSCOPY/HOLMIUM LASER/STENT PLACEMENT, RETROGRADE PYLEGRAM. (Right) as a surgical intervention.  The patient's history has been reviewed, patient examined, no change in status, stable for surgery.  I have reviewed the patient's chart and labs.  Questions were answered to the patient's satisfaction.     Mylissa [name]

## 2020-03-24 NOTE — H&P (Signed)
CC/HPI: cc: ureteral calculus   03/24/20: 44 year old woman who presented to the ER 03/13/2020 with right flank pain found have a 4 mm right UVJ calculus with mild right hydronephrosis. No sign of infection on urinalysis. She returned to the ER yesterday with worsening symptoms and CT showed stone unchanged location. This is her 1st kidney stone episode. She has also had severe nausea and vomiting. No fever or chills.     ALLERGIES: Albuterol Azithromycin Diclofenac    MEDICATIONS: Omeprazole 40 mg capsule,delayed release  Tamsulosin Hcl 0.4 mg capsule  Zyrtec 10 mg tablet  Black Elderberry  Hydrocodone-Acetaminophen 5 mg-325 mg tablet  Levalbuterol Tartrate Hfa 45 mcg/actuation hfa aerosol with adapter  Multivitamin  Ondansetron Hcl 4 mg tablet  Valacyclovir 1,000 mg tablet  Vitamin D3     GU PSH: None   NON-GU PSH: Deviated Septum Surgery     GU PMH: Urinary Calculus, Unspec    NON-GU PMH: Anxiety Asthma GERD    FAMILY HISTORY: Hypertension - Father, Mother Kidney Failure - Grandmother Kidney Stones - Father   SOCIAL HISTORY: Marital Status: Single Preferred Language: English; Ethnicity: Not Hispanic Or Latino; Race: White Current Smoking Status: Patient has never smoked.   Tobacco Use Assessment Completed: Used Tobacco in last 30 days? Does not use smokeless tobacco. Has never drank.  Does not use drugs. Drinks 1 caffeinated drink per day.    REVIEW OF SYSTEMS:    GU Review Female:   Patient reports frequent urination, hard to postpone urination, get up at night to urinate, and leakage of urine. Patient denies burning /pain with urination, stream starts and stops, trouble starting your stream, have to strain to urinate, and being pregnant.  Gastrointestinal (Upper):   Patient reports nausea, vomiting, and indigestion/ heartburn.   Gastrointestinal (Lower):   Patient reports diarrhea and constipation.   Constitutional:   Patient denies fever, night sweats,  weight loss, and fatigue.  Skin:   Patient denies skin rash/ lesion and itching.  Eyes:   Patient denies blurred vision and double vision.  Ears/ Nose/ Throat:   Patient reports sinus problems. Patient denies sore throat.  Hematologic/Lymphatic:   Patient reports easy bruising. Patient denies swollen glands.  Cardiovascular:   Patient denies chest pains and leg swelling.  Respiratory:   Patient denies cough and shortness of breath.  Endocrine:   Patient denies excessive thirst.  Musculoskeletal:   Patient reports back pain. Patient denies joint pain.  Neurological:   Patient denies headaches and dizziness.  Psychologic:   Patient denies depression and anxiety.   VITAL SIGNS:      03/24/2020 11:22 AM  Weight 120 lb / 54.43 kg  Height 63 in / 160.02 cm  BP 109/72 mmHg  Pulse 87 /min  Temperature 97.7 F / 36.5 C  BMI 21.3 kg/m   MULTI-SYSTEM PHYSICAL EXAMINATION:    Constitutional: Well-nourished. No physical deformities. Normally developed. Good grooming.  Neck: Neck symmetrical, not swollen. Normal tracheal position.  Respiratory: No labored breathing, no use of accessory muscles.   Cardiovascular: Normal temperature  Skin: No paleness, no jaundice, no cyanosis. No lesion, no ulcer, no rash.  Neurologic / Psychiatric: Oriented to time, oriented to place, oriented to person. No depression, no anxiety, no agitation.  Gastrointestinal: No rigidity, non obese abdomen.   Eyes: Normal conjunctivae. Normal eyelids.  Ears, Nose, Mouth, and Throat: Left ear no scars, no lesions, no masses. Right ear no scars, no lesions, no masses. Nose no scars, no lesions, no masses. Normal  hearing. Normal lips.  Musculoskeletal: Normal gait and station of head and neck.     Complexity of Data:  Records Review:   Previous Hospital Records, POC Tool  Urine Test Review:   Urinalysis  X-Ray Review: C.T. Abdomen/Pelvis: Reviewed Films. Reviewed Report. Discussed With Patient.    Notes:                      03/13/2020: BUN 23, creatinine 0.96  03/24/2020: Creatinine 1.14   PROCEDURES:          Urinalysis w/Scope Dipstick Dipstick Cont'd Micro  Color: Yellow Bilirubin: Neg mg/dL WBC/hpf: 0 - 5/hpf  Appearance: Clear Ketones: Neg mg/dL RBC/hpf: 3 - 10/hpf  Specific Gravity: 1.015 Blood: 2+ ery/uL Bacteria: Rare (0-9/hpf)  pH: 6.5 Protein: Neg mg/dL Cystals: NS (Not Seen)  Glucose: Neg mg/dL Urobilinogen: 0.2 mg/dL Casts: NS (Not Seen)    Nitrites: Neg Trichomonas: Not Present    Leukocyte Esterase: Trace leu/uL Mucous: Present      Epithelial Cells: 0 - 5/hpf      Yeast: NS (Not Seen)      Sperm: Not Present         Ketoralac 45m - [id number]: 60 Adm. By: ASidonie Dickens Unit: mg Lot No [id number] Route: IM Exp. Date 01/30/2021  Freq: None Mfgr.:   Site: Right Buttock   ASSESSMENT:      ICD-10 Details  1 GU:   Ureteral calculus - N20.1 Right, Acute, Uncomplicated - Patient has failed trial the of MET. There has been no change in symptoms or CT scan over the last 2 weeks. Patient is in severe pain with nausea and is ready to proceed with surgical intervention. I discussed with patient proceeding with cystoscopy, right ureteroscopy with laser lithotripsy and stent placement. We discussed the risks and benefits of the procedure including bleeding, pain, infection, stent discomfort, inability removed stone, need for staged procedure, damage to surrounding structures. She wishes to proceed with surgery as soon as possible. Given her degree of discomfort will give her Toradol in the office and schedule her for surgery later today.   2   Ureteral obstruction secondary to calculous - N13.2 Right, Acute, Uncomplicated

## 2020-03-24 NOTE — Anesthesia Procedure Notes (Signed)
Procedure Name: LMA Insertion Date/Time: 03/24/2020 5:28 PM Performed by: Elyn Peers, CRNA Pre-anesthesia Checklist: Patient identified, Emergency Drugs available, Suction available, Patient being monitored and Timeout performed Patient Re-evaluated:Patient Re-evaluated prior to induction Oxygen Delivery Method: Circle system utilized Preoxygenation: Pre-oxygenation with 100% oxygen Induction Type: IV induction Ventilation: Mask ventilation without difficulty LMA: LMA inserted LMA Size: 4.0 Number of attempts: 1 Placement Confirmation: positive ETCO2 and breath sounds checked- equal and bilateral Tube secured with: Tape Dental Injury: Teeth and Oropharynx as per pre-operative assessment

## 2020-03-24 NOTE — Op Note (Signed)
Preoperative diagnosis: right ureteral calculus  Postoperative diagnosis: right ureteral calculus  Procedure:  1. Cystoscopy 2. right ureteroscopy and stone removal 3. Ureteroscopic laser lithotripsy 4. right 43F x 24 ureteral stent placement  5.  Surgeon: Kasandra Knudsen, MD  Anesthesia: General  Complications: None  Intraoperative findings:  1. Normal urethra 2. Normal bladder with orthotopic ureteral orifices 3. 80mm distal right ureteral calculus fragmented with laser and basket stone extracted 4. 43Fr x 24 cmm right JJ stent with string  EBL: Minimal  Specimens: 1. right ureteral calculus  Disposition of specimens: [facility] for stone analysis  Indication: Morgan Wood is a 44 y.o.   patient with a  right ureteral stone and associated right symptoms. After reviewing the management options for treatment, the patient elected to proceed with the above surgical procedure(s). We have discussed the potential benefits and risks of the procedure, side effects of the proposed treatment, the likelihood of the patient achieving the goals of the procedure, and any potential problems that might occur during the procedure or recuperation. Informed consent has been obtained.   Description of procedure:  The patient was taken to the operating room and general anesthesia was induced.  The patient was placed in the dorsal lithotomy position, prepped and draped in the usual sterile fashion, and preoperative antibiotics were administered. A preoperative time-out was performed.   Cystourethroscopy was performed.  The patient's urethra was examined and was normal. The bladder was then systematically examined in its entirety. There was no evidence for any bladder tumors, stones, or other mucosal pathology.    Attention then turned to the right ureteral orifice and a 0.38 sensor guidewire was then advanced up the right ureter into the renal pelvis under fluoroscopic  guidance. The 4,5 Fr semirigid ureteroscope was then advanced into the ureter next to the guidewire and the calculus was identified.   The stone was then fragmented with the 200 micron holmium laser fiber until the stone was fragmented.  All stones were then removed from the ureter with an o tipl basket.  Reinspection of the ureter revealed no remaining visible stones or fragments.   The wire was then backloaded through the cystoscope and a ureteral stent was advance over the wire using Seldinger technique.  The stent was positioned appropriately under fluoroscopic and cystoscopic guidance.  The wire was then removed with an adequate stent curl noted in the renal pelvis as well as in the bladder.  The bladder was then emptied and the procedure ended.  The patient appeared to tolerate the procedure well and without complications.  The patient was able to be awakened and transferred to the recovery unit in satisfactory condition.   Disposition: The tether of the stent was left on and tucked inside the patient's vagina.  Instructions for removing the stent have been provided to the patient.  She will remove the stent at home on 03/27/20.

## 2020-03-24 NOTE — Anesthesia Preprocedure Evaluation (Addendum)
Anesthesia Evaluation  Patient identified by MRN, date of birth, ID band Patient awake    Reviewed: Allergy & Precautions, NPO status , Patient's Chart, lab work & pertinent test results  History of Anesthesia Complications Negative for: history of anesthetic complications  Airway Mallampati: II  TM Distance: >3 FB Neck ROM: Full    Dental  (+) Dental Advisory Given, Teeth Intact   Pulmonary asthma (mild) ,    Pulmonary exam normal        Cardiovascular negative cardio ROS Normal cardiovascular exam     Neuro/Psych  Headaches, PSYCHIATRIC DISORDERS Anxiety    GI/Hepatic Neg liver ROS, GERD  Medicated and Controlled,  Endo/Other  negative endocrine ROS  Renal/GU Renal disease (kidney stones)     Musculoskeletal  (+) Arthritis ,   Abdominal   Peds  Hematology negative hematology ROS (+)   Anesthesia Other Findings   Reproductive/Obstetrics                            Anesthesia Physical Anesthesia Plan  ASA: II  Anesthesia Plan: General   Post-op Pain Management:    Induction: Intravenous  PONV Risk Score and Plan: 3 and Treatment may vary due to age or medical condition, Ondansetron, Dexamethasone and Midazolam  Airway Management Planned: LMA  Additional Equipment: None  Intra-op Plan:   Post-operative Plan: Extubation in OR  Informed Consent: I have reviewed the patients History and Physical, chart, labs and discussed the procedure including the risks, benefits and alternatives for the proposed anesthesia with the patient or authorized representative who has indicated his/her understanding and acceptance.     Dental advisory given  Plan Discussed with: CRNA and Anesthesiologist  Anesthesia Plan Comments:        Anesthesia Quick Evaluation

## 2020-03-24 NOTE — Discharge Instructions (Signed)
DISCHARGE INSTRUCTIONS FOR KIDNEY STONE/URETERAL STENT   MEDICATIONS:  1. Resume all your other meds from home - except do not take any extra narcotic pain meds that you may have at home.  2. Pyridium is to help with the burning/stinging when you urinate. 3. Hydrocodone (patient received from ER) is for moderate/severe pain, otherwise taking upto 1000 mg every 6 hours of plainTylenol will help treat your pain.   4. Take Keflex one hour prior to removal of your stent.  5. Oxybutynin is to help with bladder cramping/pressure  ACTIVITY:  1. No strenuous activity x 1week  2. No driving while on narcotic pain medications  3. Drink plenty of water  4. Continue to walk at home - you can still get blood clots when you are at home, so keep active, but don't over do it.  5. May return to work/school tomorrow or when you feel ready   BATHING:  1. You can shower and we recommend daily showers  2. You have a string coming from your urethra: The stent string is attached to your ureteral stent. Do not pull on this.   SIGNS/SYMPTOMS TO CALL:  Please call us if you have a fever greater than 101.5, uncontrolled nausea/vomiting, uncontrolled pain, dizziness, unable to urinate, bloody urine, chest pain, shortness of breath, leg swelling, leg pain, redness around wound, drainage from wound, or any other concerns or questions.   You can reach [place] at [phone].   FOLLOW-UP:  1. You have a string attached to your stent, you may remove it on Thursday 03/27/20.  Take the antibiotic prior to doing this. To do this, pull the strings until the stents are completely removed. You may feel an odd sensation in your back. 2. Please call to schedule a follow up in 4 weeks.

## 2020-03-25 ENCOUNTER — Encounter: Payer: Self-pay | Admitting: *Deleted

## 2020-03-26 NOTE — Anesthesia Postprocedure Evaluation (Signed)
Anesthesia Post Note  Patient: Morgan Wood  Procedure(s) Performed: CYSTOSCOPY/URETEROSCOPY/HOLMIUM LASER/STENT PLACEMENT, RETROGRADE PYLEGRAM. (Right )     Patient location during evaluation: PACU Anesthesia Type: General Level of consciousness: awake and alert Pain management: pain level controlled Vital Signs Assessment: post-procedure vital signs reviewed and stable Respiratory status: spontaneous breathing, nonlabored ventilation and respiratory function stable Cardiovascular status: blood pressure returned to baseline and stable Postop Assessment: no apparent nausea or vomiting Anesthetic complications: no    Last Vitals:  Vitals:   03/24/20 1815 03/24/20 1841  BP: 108/72 112/79  Pulse: 79 76  Resp: 16 12  Temp: 37 C 37 C  SpO2: 100% 98%    Last Pain:  Vitals:   03/24/20 1841  TempSrc:   PainSc: 2                  Beryle Lathe

## 2020-04-18 NOTE — Progress Notes (Signed)
44 y.o. G0P0000 Single White or Caucasian female here for annual exam.  Has kidney stone in May.  Had to have lithotripsy and ureteroscopy with stone removal.  Has follow up with Dr. Arita Miss tomorrow.    Menstrual cycles are regular.  Flow lasts typically 3 days.  PCP:  Dr. Sharee Holster who is          Sexually active: No.  The current method of family planning is abstinence.    Exercising: Yes.    walking & softball Smoker:  no  Health Maintenance: Pap: 06-16-15 neg,  01-05-18 neg, 04-19-2019 neg History of abnormal Pap:  yes MMG:  06/2019 bilateral & rt breast u/s category c density birads 1:neg Colonoscopy:  Guidelines reviewed with pt.  She will call later this year for referral BMD:   none TDaP:  2015 Pneumonia vaccine(s):  no Shingrix:   no Hep C testing: neg 2019 Screening Labs: will be done today   reports that she has never smoked. She has never used smokeless tobacco. She reports that she does not drink alcohol and does not use drugs.  Past Medical History:  Diagnosis Date  . Allergic rhinitis   . Allergy   . Asthma   . Bronchitis    1-2 episodes yearly  . Environmental allergies    rash,swelling in throat,itching  . Headache(784.0)   . Kidney stone   . Laryngitis    2-3episodes yearly    Past Surgical History:  Procedure Laterality Date  . bulging disc C3 C4  2006  . CYSTOSCOPY/URETEROSCOPY/HOLMIUM LASER/STENT PLACEMENT Right 03/24/2020   Procedure: CYSTOSCOPY/URETEROSCOPY/HOLMIUM LASER/STENT PLACEMENT, RETROGRADE PYLEGRAM.;  Surgeon: Noel Christmas, MD;  Location: [facility];  Service: Urology;  Laterality: Right;  . EUS  10/13/2012   Procedure: UPPER ENDOSCOPIC ULTRASOUND (EUS) LINEAR;  Surgeon: Theda Belfast, MD;  Location: [facility];  Service: Endoscopy;  Laterality: N/A;  . NASAL SEPTUM SURGERY  11/01/2002  . UPPER GASTROINTESTINAL ENDOSCOPY  11/13; 12/13  . WISDOM TOOTH EXTRACTION      Current Outpatient Medications  Medication Sig Dispense Refill  .  cetirizine (ZYRTEC) 10 MG tablet Take 10 mg by mouth daily.    . cholecalciferol (VITAMIN D3) 25 MCG (1000 UNIT) tablet Take 1,000 Units by mouth daily.    Marland Kitchen ELDERBERRY PO Take 1 tablet by mouth daily.    Marland Kitchen levalbuterol (XOPENEX HFA) 45 MCG/ACT inhaler Inhale 1-2 puffs into the lungs every 6 (six) hours as needed for wheezing or shortness of breath. prn 1 Inhaler 12  . Multiple Vitamin (MULTIVITAMIN WITH MINERALS) TABS tablet Take 1 tablet by mouth daily.    Marland Kitchen omeprazole (PRILOSEC) 40 MG capsule Take 40 mg by mouth as needed.     . valACYclovir (VALTREX) 1000 MG tablet TAKE 1 TABLET BY MOUTH EVERY DAY (Patient taking differently: Take 1,000 mg by mouth as needed. ) 30 tablet 11   No current facility-administered medications for this visit.    Family History  Problem Relation Age of Onset  . Hypertension Father   . Diabetes Other   . Heart disease Other   . Cancer Maternal Grandfather        skin cancer  . Heart attack Maternal Grandfather   . Emphysema Maternal Grandfather   . Parkinsonism Paternal Grandmother   . Heart attack Paternal Grandfather   . Cancer Maternal Grandmother     Review of Systems  Constitutional: Negative.   HENT: Negative.   Eyes: Negative.   Respiratory: Negative.   Cardiovascular:  Negative.   Gastrointestinal: Negative.   Endocrine: Negative.   Genitourinary: Negative.   Musculoskeletal: Negative.   Skin: Negative.   Allergic/Immunologic: Negative.   Neurological: Negative.   Hematological: Negative.   Psychiatric/Behavioral: Negative.     Exam:   BP 110/70   Pulse 70   Temp (!) 97.5 F (36.4 C) (Skin)   Resp 16   Ht 5' 3.25" (1.607 m)   Wt 117 lb (53.1 kg)   LMP 04/20/2020 (Exact Date)   BMI 20.56 kg/m   Height: 5' 3.25" (160.7 cm)  General appearance: alert, cooperative and appears stated age Head: Normocephalic, without obvious abnormality, atraumatic Neck: no adenopathy, supple, symmetrical, trachea midline and thyroid normal to  inspection and palpation Lungs: clear to auscultation bilaterally Breasts: normal appearance, no masses or tenderness Heart: regular rate and rhythm Abdomen: soft, non-tender; bowel sounds normal; no masses,  no organomegaly Extremities: extremities normal, atraumatic, no cyanosis or edema Skin: Skin color, texture, turgor normal. No rashes or lesions Lymph nodes: Cervical, supraclavicular, and axillary nodes normal. No abnormal inguinal nodes palpated Neurologic: Grossly normal   Pelvic: External genitalia:  no lesions              Urethra:  normal appearing urethra with no masses, tenderness or lesions              Bartholins and Skenes: normal                 Vagina: normal appearing vagina with normal color and discharge, no lesions              Cervix: no lesions              Pap taken: Yes.   Bimanual Exam:  Uterus:  normal size, contour, position, consistency, mobility, non-tender              Adnexa: normal adnexa and no mass, fullness, tenderness               Rectovaginal: Confirms               Anus:  normal sphincter tone, no lesions  Chaperone, Terence Lux, CMA, was present for exam.  A:  Well Woman with normal exam Not SA Asthma hx Oral fever blisters H/o hemorrhoids Renal stone this year  P:   Mammogram guidelines reviewed.  Doing 3D MMGs pap smear with HR HPV obtained today CBC, CMP, Lipids, TSH and Vit D obtained today Valtrex 1 gram tablets, 2 tabs q 12 hours for two doses.  #30/2RF Colonoscopy guidelines reviewed.  She will let me know later this year when ready for referral Return annually or prn

## 2020-04-21 ENCOUNTER — Other Ambulatory Visit (HOSPITAL_COMMUNITY)
Admission: RE | Admit: 2020-04-21 | Discharge: 2020-04-21 | Disposition: A | Discharge status: Home / Self Care | Payer: BC Managed Care – PPO | Source: Ambulatory Visit | Attending: Obstetrics & Gynecology | Admitting: Obstetrics & Gynecology

## 2020-04-21 ENCOUNTER — Encounter: Payer: Self-pay | Admitting: Obstetrics & Gynecology

## 2020-04-21 ENCOUNTER — Other Ambulatory Visit: Payer: Self-pay

## 2020-04-21 ENCOUNTER — Ambulatory Visit: Payer: BC Managed Care – PPO | Admitting: Obstetrics & Gynecology

## 2020-04-21 VITALS — BP 110/70 | HR 70 | Temp 97.5°F | Resp 16 | Ht 63.25 in | Wt 117.0 lb

## 2020-04-21 DIAGNOSIS — Z0000 Encounter for general adult medical examination without abnormal findings: Secondary | ICD-10-CM

## 2020-04-21 DIAGNOSIS — Z01419 Encounter for gynecological examination (general) (routine) without abnormal findings: Secondary | ICD-10-CM | POA: Diagnosis not present

## 2020-04-21 DIAGNOSIS — Z124 Encounter for screening for malignant neoplasm of cervix: Secondary | ICD-10-CM

## 2020-04-21 MED ORDER — VALACYCLOVIR HCL 1 G PO TABS: ORAL_TABLET | ORAL | 2 refills | Status: DC

## 2020-04-22 LAB — COMPREHENSIVE METABOLIC PANEL
ALT: 14 IU/L (ref 0–32)
AST: 15 IU/L (ref 0–40)
Albumin/Globulin Ratio: 1.7 (ref 1.2–2.2)
Albumin: 4.2 g/dL (ref 3.8–4.8)
Alkaline Phosphatase: 54 IU/L (ref 48–121)
BUN/Creatinine Ratio: 18 (ref 9–23)
BUN: 14 mg/dL (ref 6–24)
Bilirubin Total: <0.2 mg/dL (ref 0.0–1.2)
CO2: 25 mmol/L (ref 20–29)
Calcium: 9.3 mg/dL (ref 8.7–10.2)
Chloride: 103 mmol/L (ref 96–106)
Creatinine, Ser: 0.76 mg/dL (ref 0.57–1.00)
GFR calc Af Amer: 110 mL/min/{1.73_m2} (ref >59)
GFR calc non Af Amer: 95 mL/min/{1.73_m2} (ref >59)
Globulin, Total: 2.5 g/dL (ref 1.5–4.5)
Glucose: 89 mg/dL (ref 65–99)
Potassium: 4 mmol/L (ref 3.5–5.2)
Sodium: 138 mmol/L (ref 134–144)
Total Protein: 6.7 g/dL (ref 6.0–8.5)

## 2020-04-22 LAB — TSH: TSH: 1.46 u[IU]/mL (ref 0.450–4.500)

## 2020-04-22 LAB — LIPID PANEL
Chol/HDL Ratio: 3 ratio (ref 0.0–4.4)
Cholesterol, Total: 152 mg/dL (ref 100–199)
HDL: 51 mg/dL (ref >39)
LDL Chol Calc (NIH): 87 mg/dL (ref 0–99)
Triglycerides: 70 mg/dL (ref 0–149)
VLDL Cholesterol Cal: 14 mg/dL (ref 5–40)

## 2020-04-22 LAB — CBC
Hematocrit: 37.7 % (ref 34.0–46.6)
Hemoglobin: 12.3 g/dL (ref 11.1–15.9)
MCH: 29.5 pg (ref 26.6–33.0)
MCHC: 32.6 g/dL (ref 31.5–35.7)
MCV: 90 fL (ref 79–97)
Platelets: 254 10*3/uL (ref 150–450)
RBC: 4.17 x10E6/uL (ref 3.77–5.28)
RDW: 12.9 % (ref 11.7–15.4)
WBC: 6.5 10*3/uL (ref 3.4–10.8)

## 2020-04-22 LAB — VITAMIN D 25 HYDROXY (VIT D DEFICIENCY, FRACTURES): Vit D, 25-Hydroxy: 78.3 ng/mL (ref 30.0–100.0)

## 2020-04-24 LAB — CYTOLOGY - PAP
Comment: NEGATIVE
Diagnosis: NEGATIVE
High risk HPV: NEGATIVE

## 2020-06-30 ENCOUNTER — Encounter: Payer: Self-pay | Admitting: Physician Assistant

## 2020-06-30 ENCOUNTER — Other Ambulatory Visit: Payer: Self-pay

## 2020-06-30 ENCOUNTER — Ambulatory Visit (INDEPENDENT_AMBULATORY_CARE_PROVIDER_SITE_OTHER): Payer: BC Managed Care – PPO | Admitting: Physician Assistant

## 2020-06-30 VITALS — BP 116/80 | HR 91 | Ht 63.25 in | Wt 123.9 lb

## 2020-06-30 DIAGNOSIS — G2581 Restless legs syndrome: Secondary | ICD-10-CM

## 2020-06-30 DIAGNOSIS — R202 Paresthesia of skin: Secondary | ICD-10-CM | POA: Diagnosis not present

## 2020-06-30 NOTE — Patient Instructions (Signed)

## 2020-06-30 NOTE — Progress Notes (Signed)
Acute Office Visit  Subjective:    Patient ID: Morgan Wood, female    DOB: [birth year], 44 y.o.   MRN: [id number]  Chief Complaint  Patient presents with   Foot Pain    HPI Patient is in today for right lower leg discomfort. Reports tingling sensation which occurs at night and sometimes during the day, especially when she has been on her feet all day. Denies swelling or erythema. Patient states she has restless leg syndrome and at night she has to keep her feet warm and usually sleeps with thin sheets. States she takes Tylenol which provides some relief. She was staying hydrated but since school started again she hasn't been as good with water hydration, and has noticed her symptoms are more frequent. Reports she is sensitive to medications and prefers to manage symptoms conservatively.   Past Medical History:  Diagnosis Date   Allergic rhinitis    Allergy    Asthma    Bronchitis    1-2 episodes yearly   Environmental allergies    rash,swelling in throat,itching   Headache(784.0)    Kidney stone    Laryngitis    2-3episodes yearly    Past Surgical History:  Procedure Laterality Date   bulging disc C3 C4  2006   CYSTOSCOPY/URETEROSCOPY/HOLMIUM LASER/STENT PLACEMENT Right 03/24/2020   Procedure: CYSTOSCOPY/URETEROSCOPY/HOLMIUM LASER/STENT PLACEMENT, RETROGRADE PYLEGRAM.;  Surgeon: Noel Christmas, MD;  Location: [facility];  Service: Urology;  Laterality: Right;   EUS  10/13/2012   Procedure: UPPER ENDOSCOPIC ULTRASOUND (EUS) LINEAR;  Surgeon: Theda Belfast, MD;  Location: [facility];  Service: Endoscopy;  Laterality: N/A;   NASAL SEPTUM SURGERY  11/01/2002   UPPER GASTROINTESTINAL ENDOSCOPY  11/13; 12/13   WISDOM TOOTH EXTRACTION      Family History  Problem Relation Age of Onset   Hypertension Father    Diabetes Other    Heart disease Other    Cancer Maternal Grandfather        skin cancer   Heart attack Maternal Grandfather    Emphysema  Maternal Grandfather    Parkinsonism Paternal Grandmother    Heart attack Paternal Grandfather    Cancer Maternal Grandmother     Social History   Socioeconomic History   Marital status: Single    Spouse name: Not on file   Number of children: 0   Years of education: Not on file   Highest education level: Not on file  Occupational History   Occupation: school teacher  Tobacco Use   Smoking status: Never Smoker   Smokeless tobacco: Never Used  Building services engineer Use: Never used  Substance and Sexual Activity   Alcohol use: No   Drug use: No   Sexual activity: Not Currently    Partners: Male    Birth control/protection: Abstinence  Other Topics Concern   Not on file  Social History Narrative   Not on file   Social Determinants of Health   Financial Resource Strain:    Difficulty of Paying Living Expenses: Not on file  Food Insecurity:    Worried About Programme researcher, broadcasting/film/video in the Last Year: Not on file   [employer] of Food in the Last Year: Not on file  Transportation Needs:    Lack of Transportation (Medical): Not on file   Lack of Transportation (Non-Medical): Not on file  Physical Activity:    Days of Exercise per Week: Not on file   Minutes of Exercise per Session: Not on  file  Stress:    Feeling of Stress : Not on file  Social Connections:    Frequency of Communication with Friends and Family: Not on file   Frequency of Social Gatherings with Friends and Family: Not on file   Attends Religious Services: Not on file   Active Member of Clubs or Organizations: Not on file   Attends Banker Meetings: Not on file   Marital Status: Not on file  Intimate Partner Violence:    Fear of Current or Ex-Partner: Not on file   Emotionally Abused: Not on file   Physically Abused: Not on file   Sexually Abused: Not on file    Outpatient Medications Prior to Visit  Medication Sig Dispense Refill   cetirizine (ZYRTEC) 10 MG  tablet Take 10 mg by mouth daily.     cholecalciferol (VITAMIN D3) 25 MCG (1000 UNIT) tablet Take 1,000 Units by mouth daily.     ELDERBERRY PO Take 1 tablet by mouth daily.     levalbuterol (XOPENEX HFA) 45 MCG/ACT inhaler Inhale 1-2 puffs into the lungs every 6 (six) hours as needed for wheezing or shortness of breath. prn 1 Inhaler 12   Multiple Vitamin (MULTIVITAMIN WITH MINERALS) TABS tablet Take 1 tablet by mouth daily.     omeprazole (PRILOSEC) 40 MG capsule Take 40 mg by mouth as needed.      valACYclovir (VALTREX) 1000 MG tablet Take 2 tabs and repeat in 12 hours.  Start with onset of symptoms. 30 tablet 2   No facility-administered medications prior to visit.    Allergies  Allergen Reactions   Albuterol Other (See Comments)    High Heart Rate Jitters   Diclofenac Other (See Comments)    Caused chest pain   Azithromycin Other (See Comments)    Stomach cramps    Review of Systems Review of Systems:  A fourteen system review of systems was performed and found to be positive as per HPI. Objective:    Physical Exam General: Well nourished, in no apparent distress. Eyes: PERRLA, EOMs, conjunctiva clr Resp: Respiratory effort- normal, ECTA B/L w/o W/R/R  Cardio: RRR w/o MRGs. Abdomen: no gross distention. Lymphatics:  less 2 sec cap RF, no lower extremity edema M-sk: Full ROM, good pedal pulses, no TTP of lower extremity, normal gait.  Skin: Warm, dry. No erythema of lower extremity.  Neuro: Alert, Oriented Psych: Normal affect, Insight and Judgment appropriate.   BP 116/80    Pulse 91    Ht 5' 3.25" (1.607 m)    Wt 123 lb 14.4 oz (56.2 kg)    LMP  (LMP Unknown)    SpO2 99%    BMI 21.77 kg/m  Wt Readings from Last 3 Encounters:  06/30/20 123 lb 14.4 oz (56.2 kg)  04/21/20 117 lb (53.1 kg)  03/24/20 117 lb 15.1 oz (53.5 kg)    Health Maintenance Due  Topic Date Due   COVID-19 Vaccine (2 - Pfizer 2-dose series) 01/18/2020   INFLUENZA VACCINE  06/01/2020     There are no preventive care reminders to display for this patient.   Lab Results  Component Value Date   TSH 1.460 04/21/2020   Lab Results  Component Value Date   WBC 6.5 04/21/2020   HGB 12.3 04/21/2020   HCT 37.7 04/21/2020   MCV 90 04/21/2020   PLT 254 04/21/2020   Lab Results  Component Value Date   NA 138 04/21/2020   K 4.0 04/21/2020   CO2 25  04/21/2020   GLUCOSE 89 04/21/2020   BUN 14 04/21/2020   CREATININE 0.76 04/21/2020   BILITOT <0.2 04/21/2020   ALKPHOS 54 04/21/2020   AST 15 04/21/2020   ALT 14 04/21/2020   PROT 6.7 04/21/2020   ALBUMIN 4.2 04/21/2020   CALCIUM 9.3 04/21/2020   ANIONGAP 10 03/23/2020   Lab Results  Component Value Date   CHOL 152 04/21/2020   Lab Results  Component Value Date   HDL 51 04/21/2020   Lab Results  Component Value Date   LDLCALC 87 04/21/2020   Lab Results  Component Value Date   TRIG 70 04/21/2020   Lab Results  Component Value Date   CHOLHDL 3.0 04/21/2020   Lab Results  Component Value Date   HGBA1C 5.4 10/23/2018       Assessment & Plan:   Problem List Items Addressed This Visit    None    Visit Diagnoses    Restless leg syndrome    -  Primary   Paresthesia of right lower extremity         Restless leg syndrome, Paresthesia of right lower extremity: -Reassurance provided no signs concerning for DVT. -Advised to increase hydration and take Tylenol as needed. -If symptoms worsen recommend further evaluation with labs and imaging. Pt verbalized understanding. -Recommend to avoid caffeine and do local massage or brisk walking to improve symptoms. -Advised to schedule a CPE before the end of the year    No orders of the defined types were placed in this encounter.    Mayer Masker, PA-C

## 2020-08-11 ENCOUNTER — Telehealth: Payer: Self-pay | Admitting: Internal Medicine

## 2020-08-12 NOTE — Telephone Encounter (Signed)
Attempted to call pt but unable to reach. Left message for her to return call. 

## 2020-08-12 NOTE — Telephone Encounter (Signed)
Spoke with the pt  She is wanting to know if okay to get her covid booster now It has been 6 months since last vaccine  Also asking if she can get the flu shot at the same time  Please advise, thanks!

## 2020-08-12 NOTE — Telephone Encounter (Signed)
Ok to go ahead and get Covid booster now.  I have seen published that it is ok to get Covid vaccine and flu shot at same time. Most people are choosing to separate them by a week or two, just being cautious.

## 2020-08-13 NOTE — Telephone Encounter (Signed)
LMTCB

## 2020-08-13 NOTE — Telephone Encounter (Signed)
Pt returning a phone call. Pt can be reached at [phone].

## 2020-08-13 NOTE — Telephone Encounter (Signed)
Spoke with pt and notified of recs per CDY  Pt verbalized understanding  Nothing further needed

## 2020-08-19 ENCOUNTER — Telehealth: Payer: Self-pay | Admitting: Physician Assistant

## 2020-08-19 NOTE — Telephone Encounter (Signed)
Patient had COVID booster Friday and has some clinical questions in regards to this booster. Please contact patient when available

## 2020-08-19 NOTE — Telephone Encounter (Signed)
Symptoms are most likely side effects from booster vaccine. If symptoms fail to improve or worsen and is concerned about Covid-19 infection then it is reasonable to get tested.   Thank you, Kandis Cocking

## 2020-08-19 NOTE — Telephone Encounter (Signed)
Pt states that she had the booster on 08/15/2020 afternoon.  Today pt has temp of 99-something, hurting in the armpit, nauseous this morning,  and mild case of body aches.  Please advise if pt should be tested for COVID.  Tiajuana Amass, CMA

## 2020-08-20 NOTE — Telephone Encounter (Signed)
08/20/2020  Pt informed.  Pt expressed understanding and is agreeable.  Tiajuana Amass, CMA

## 2020-08-21 ENCOUNTER — Encounter: Payer: Self-pay | Admitting: Obstetrics & Gynecology

## 2020-11-30 ENCOUNTER — Encounter: Payer: Self-pay | Admitting: Physician Assistant

## 2020-11-30 ENCOUNTER — Other Ambulatory Visit: Payer: Self-pay | Admitting: Physician Assistant

## 2020-11-30 DIAGNOSIS — Z1211 Encounter for screening for malignant neoplasm of colon: Secondary | ICD-10-CM

## 2020-12-01 ENCOUNTER — Encounter: Payer: Self-pay | Admitting: Urology

## 2020-12-03 NOTE — Telephone Encounter (Signed)
Mandi, please advise if you have located scans and report as pt stated she dropped them off at office this afternoon 2/2.

## 2020-12-04 NOTE — Telephone Encounter (Signed)
I have the CT and report. This is almost certainly a benign little scar. It would not cause any symptoms, so it would not be causing the back ache and chest tightness. I would always be happy to see Morgan Wood sooner if needed, but otherwise it is fine to keep the March appointment as planned.

## 2020-12-04 NOTE — Telephone Encounter (Signed)
I have located this and put it on Dr. Sinclair Ship laptop for him to review and notified the patient that we received it. Will forward to Dr. Maple Hudson as Lorain Childes to get recommendations.

## 2021-01-03 NOTE — Progress Notes (Signed)
Patient ID: Morgan Wood, female    DOB: [birth year], 45 y.o.   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 Breathing is at patient baseline.    Asks Xopenex refill to keep available, but has done well through winter w/o exacerbation.  Controls rhinitis with saline rinse and can add flonase and antihistamines as needed seasonally.  Now on acid blocker. Discussed reflux precautions.   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 Discussed incidental lung nodule noted on CT abd during w/u for kidney stone. Discussed pneumonia vaccine.   ROS-see HPI + = positive Constitutional:   No-   weight loss, night sweats, +fevers, chills, fatigue, lassitude. HEENT:   + headaches, difficulty swallowing, tooth/dental problems, +sore throat,       No-  sneezing, itching, ear ache, +nasal congestion, +post nasal drip,  CV:  No-   chest pain, orthopnea, PND, swelling in lower extremities, anasarca, dizziness, palpitations Resp: No-   shortness of breath with exertion or at rest.              No-   productive cough,  + non-productive cough,  No-  coughing up of blood.              No-   change in color of mucus.  No- wheezing.   Skin: No-   rash or lesions. GI:  No-   heartburn, indigestion, abdominal pain, nausea, vomiting,  GU: . MS:  No-   joint pain or swelling.   Neuro-     nothing unusual Psych:  No- change in mood or affect. No depression or anxiety.  No memory loss.  Objective:   Physical Exam amb wf nad General- Alert, Oriented, Affect-appropriate, Distress- none acute, slender Skin- rash-none, lesions- none, excoriation- none Lymphadenopathy- none Head- atraumatic            Eyes- Gross vision intact, PERRLA, conjunctivae clear secretions            Ears- Hearing, canals-normal, TMs clear            Nose-  turbinate edema, no-Septal dev, polyps, erosion, perforation             Throat- Mallampati II , mucosa clear , drainage- none, tonsils- present Neck- flexible , trachea midline, no stridor , thyroid nl, carotid no bruit Chest - symmetrical excursion , unlabored           Heart/CV- RRR , no murmur , no gallop  , no rub, nl s1 s2                            JVD- trace/ fills from above , edema- none, stasis changes- none, varices- none           Lung- clear to P&A, wheeze- none, cough- none , dullness-none, rub- none  Chest wall-  Abd-  Soft, no tenderness, with particular attention to left upper quadrant Extrem- cyanosis- none, clubbing, none, atrophy- none, strength- nl

## 2021-01-05 ENCOUNTER — Other Ambulatory Visit: Payer: Self-pay

## 2021-01-05 ENCOUNTER — Encounter: Payer: Self-pay | Admitting: Internal Medicine

## 2021-01-05 ENCOUNTER — Ambulatory Visit (INDEPENDENT_AMBULATORY_CARE_PROVIDER_SITE_OTHER): Payer: BC Managed Care – PPO | Admitting: Internal Medicine

## 2021-01-05 VITALS — BP 108/70 | HR 82 | Temp 98.2°F | Ht 60.0 in | Wt 118.4 lb

## 2021-01-05 DIAGNOSIS — R911 Solitary pulmonary nodule: Secondary | ICD-10-CM | POA: Diagnosis not present

## 2021-01-05 DIAGNOSIS — J4520 Mild intermittent asthma, uncomplicated: Secondary | ICD-10-CM | POA: Diagnosis not present

## 2021-01-05 MED ORDER — LEVALBUTEROL TARTRATE 45 MCG/ACT IN AERO: 1.0000 | INHALATION_SPRAY | Freq: Four times a day (QID) | RESPIRATORY_TRACT | 12 refills | Status: DC | PRN

## 2021-01-05 NOTE — Patient Instructions (Signed)
Order- schedule CT chest for January, 2023    Dx RLL nodule seen on Urology CT abd Jan, 2022 report scanned)  Refill sent for xopenex inhaler  Please call if we can help

## 2021-01-15 ENCOUNTER — Telehealth: Payer: Self-pay | Admitting: Internal Medicine

## 2021-01-15 MED ORDER — DOXYCYCLINE HYCLATE 100 MG PO TABS
ORAL_TABLET | ORAL | 0 refills | Status: DC
Start: 2021-01-15 — End: 2021-03-26

## 2021-01-15 NOTE — Telephone Encounter (Signed)
Called and spoke to pt. Pt states the weather changes have caused her to have a sinus infection. Pt c/o sinus congestion with yellow mucus with occasional scant amounts of blood, maxillary sinus pressure and BIL ear pressure, headache, cough with little mucus, wheezing x 1 week. Pt states she is occasionally taking tylenol with some help. Pt denies fever, sob, chest congestion, swelling, CP/tightness. Pt states she hasnt felt she has needed her Xopenex hfa. Pt has been vaccinated and boosted against covid. Pt denies known covid contacts Pt last seen on 01/05/2021 by Dr. Maple Hudson for lung nodule.   Dr. Maple Hudson please advise. Thanks.   Allergies  Allergen Reactions  . Albuterol Other (See Comments)    High Heart Rate Jitters  . Diclofenac Other (See Comments)    Caused chest pain  . Azithromycin Other (See Comments)    Stomach cramps    Current Outpatient Medications on File Prior to Visit  Medication Sig Dispense Refill  . cetirizine (ZYRTEC) 10 MG tablet Take 10 mg by mouth daily.    . cholecalciferol (VITAMIN D3) 25 MCG (1000 UNIT) tablet Take 1,000 Units by mouth daily.    Marland Kitchen ELDERBERRY PO Take 1 tablet by mouth daily.    Marland Kitchen levalbuterol (XOPENEX HFA) 45 MCG/ACT inhaler Inhale 1-2 puffs into the lungs every 6 (six) hours as needed for wheezing or shortness of breath. prn 1 each 12  . Multiple Vitamin (MULTIVITAMIN WITH MINERALS) TABS tablet Take 1 tablet by mouth daily.    Marland Kitchen omeprazole (PRILOSEC) 40 MG capsule Take 40 mg by mouth as needed.     . valACYclovir (VALTREX) 1000 MG tablet Take 2 tabs and repeat in 12 hours.  Start with onset of symptoms. 30 tablet 2   No current facility-administered medications on file prior to visit.

## 2021-01-15 NOTE — Telephone Encounter (Signed)
Suggest doxycycline 100mg , # 8    2 on day 1, then one daily An otc nasal saline rinse ( like NeilMed, AYR, etc) or Neti pot may help

## 2021-01-15 NOTE — Telephone Encounter (Signed)
Called and spoke to pt. Informed her of the recs per CY. Script sent to preferred pharmacy. Pt verbalized understanding and denied any further questions or concerns at this time.

## 2021-01-16 NOTE — Telephone Encounter (Signed)
Dr. Maple Hudson, Please see patient note and advise.  Thank you.

## 2021-01-16 NOTE — Telephone Encounter (Signed)
Yes - by all means- take the antibiotic with food. Hope it goes better.

## 2021-02-24 NOTE — Telephone Encounter (Signed)
Please advise on patient mychart message  My right gland is a little swollen and my right ear has been stopped up and bothering me. This started yesterday. Right now everything else seems alright.  I didn't know if I needed to come in and see you or someone else or what.  Juliene Pina

## 2021-02-24 NOTE — Telephone Encounter (Signed)
This may just be a mild, self-limited viral infection.  Suggest Sudafed decongestant from pharmacy counter and maybe warm compress to ear. If not better in a day or two, please let me know.

## 2021-03-03 IMAGING — US ULTRASOUND RIGHT BREAST LIMITED
1 series · 4 of 4 positions shown · non-contrast
Comparison: Previous exam(s).

CLINICAL DATA: Screening recall for a possible mass in the right
breast.

EXAM:
DIGITAL DIAGNOSTIC RIGHT MAMMOGRAM WITH CAD AND TOMO
ULTRASOUND RIGHT BREAST

[Series 1: ultrasound right breast limited · 0.06mm/px · 4 of 4 slices shown]
[im 1/4]
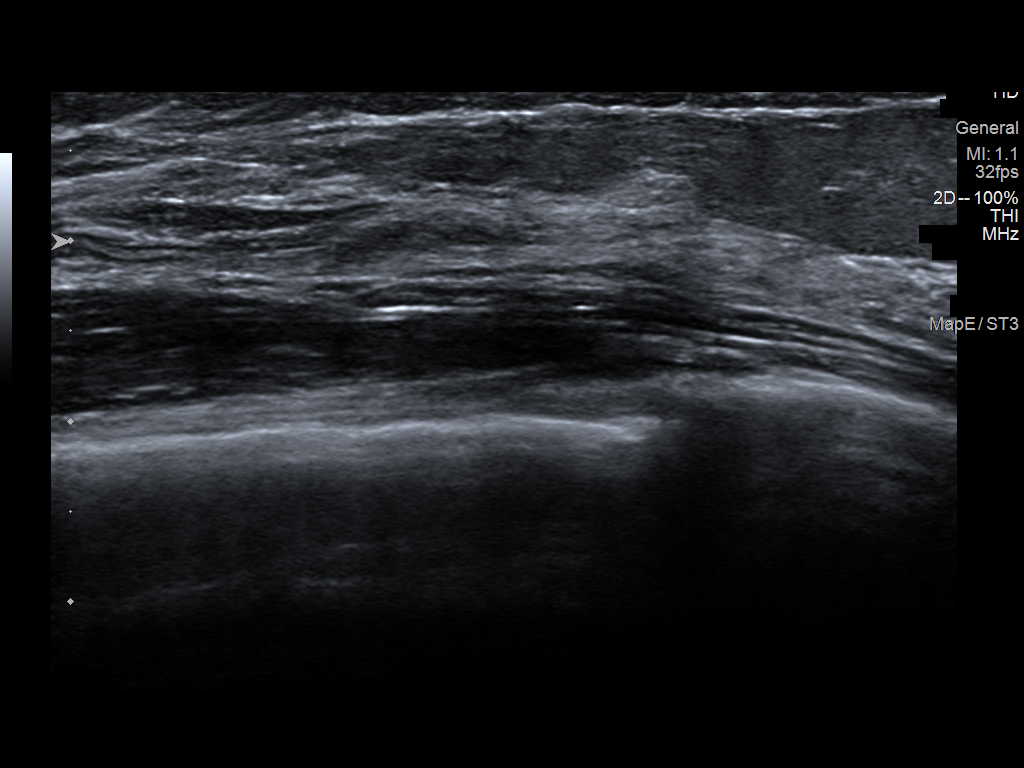
[im 2/4]
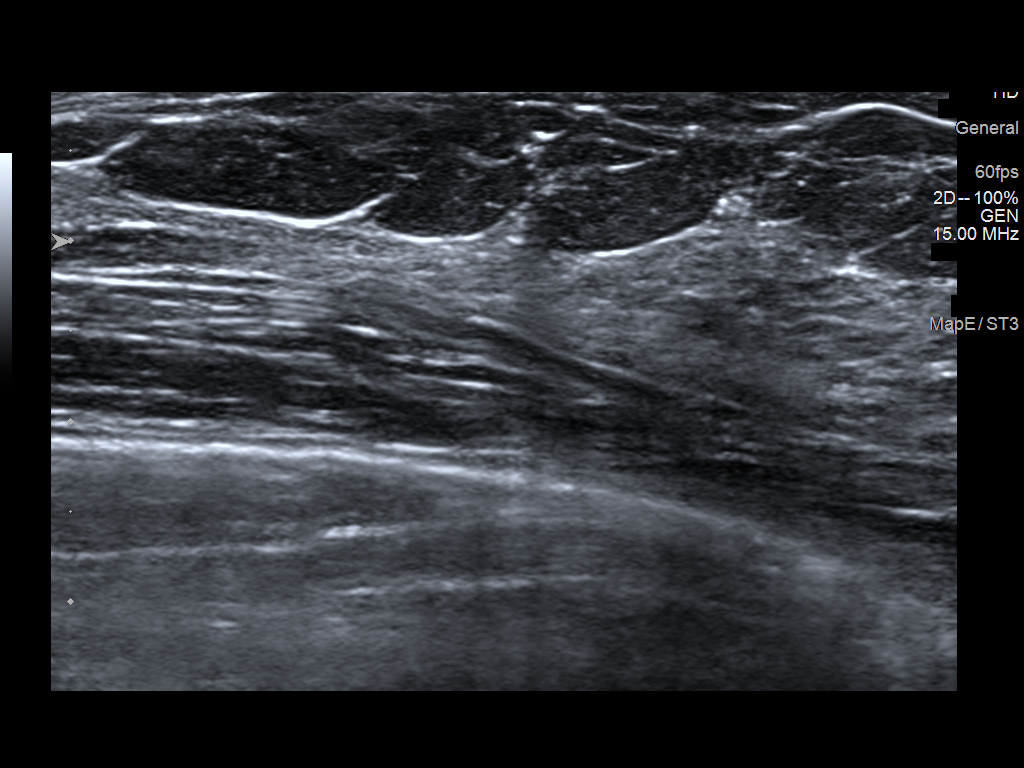
[im 3/4]
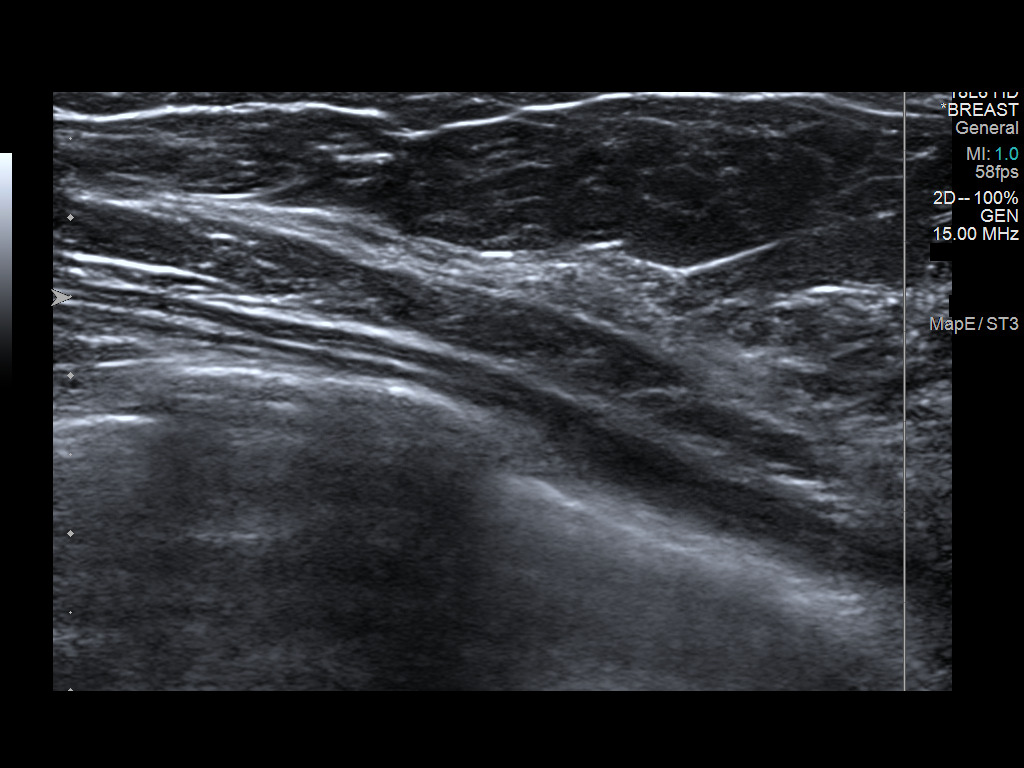
[im 4/4]
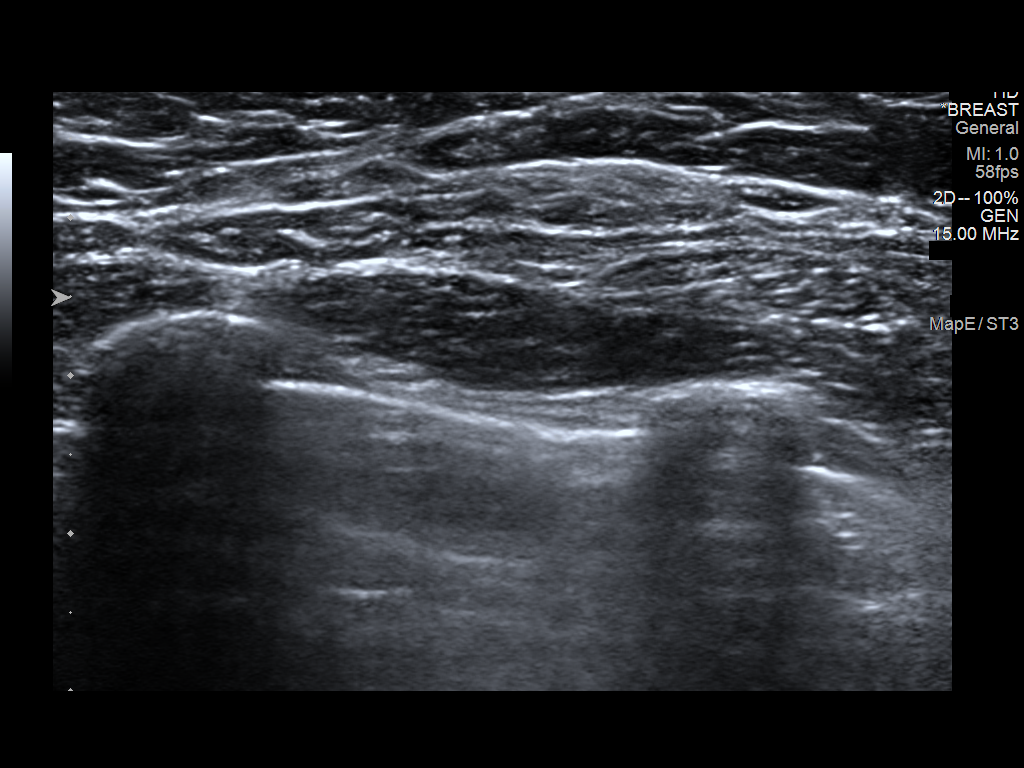

[4 of 4 positions shown; findings below may reference images not displayed]

ACR Breast Density Category c: The breast tissue is heterogeneously
dense, which may obscure small masses.
FINDINGS: On the diagnostic spot-compression images, the possible mass in the
upper outer, axillary tail region of the right breast disperses
consistent with superimposed fibroglandular tissue. There is no mass
or significant residual asymmetry. A small lymph node is noted in
this location, less than 3 mm in long axis. There are no areas of
architectural distortion and no suspicious calcifications.

Mammographic images were processed with CAD.

On physical exam, no mass is palpated in the far upper outer right
breast or right inferior axilla.

Targeted ultrasound is performed, showing normal tissue throughout
the axillary tail region of the right breast. No mass or suspicious
lesion.
IMPRESSION: Negative exam.  No evidence of breast malignancy.

RECOMMENDATION:
Screening mammogram in one year.(Code:[id number])

I have discussed the findings and recommendations with the patient.
Results were also provided in writing at the conclusion of the
visit. If applicable, a reminder letter will be sent to the patient
regarding the next appointment.

BI-RADS CATEGORY  1: Negative.

## 2021-03-26 MED ORDER — DOXYCYCLINE HYCLATE 100 MG PO TABS: ORAL_TABLET | ORAL | 0 refills | Status: DC

## 2021-03-26 NOTE — Telephone Encounter (Signed)
Received the following message from patient:   " Hello, I know it is late in the day, my sinus pressure has returned on the left side of my face above my eye and left ear. I have tried Sudafed, but it is not going away. I gland on that side doesn't feel swollen but at times feels sore. So far no fever, headache (just pressure), no drainage. Please advise.  Morgan Wood    sinus pressure      "  Dr. Maple Hudson, can you please advise? Thanks!

## 2021-03-26 NOTE — Addendum Note (Signed)
Addended by: Maurene Capes on: 03/26/2021 04:53 PM   Modules accepted: Orders

## 2021-03-26 NOTE — Telephone Encounter (Signed)
Doxycycline 100 mg, # 8, 2 today then one daily  Agree with sudafed. Otc nasal saline rinse or Neti Pot may also help

## 2021-03-26 NOTE — Telephone Encounter (Signed)
Called and spoke with patient. She is aware of CY's recommendations. Will go ahead and call in doxy.   Nothing further needed at time of call.

## 2021-04-19 DIAGNOSIS — R911 Solitary pulmonary nodule: Secondary | ICD-10-CM | POA: Insufficient documentation

## 2021-04-19 NOTE — Assessment & Plan Note (Signed)
Most likely benign Plan- CT chest January, 2023

## 2021-04-19 NOTE — Assessment & Plan Note (Signed)
Continues mild uncomplicated with rare need for rescue inhaler. Plan- continue to provide rescue inhaler as directed

## 2021-04-22 ENCOUNTER — Encounter (HOSPITAL_BASED_OUTPATIENT_CLINIC_OR_DEPARTMENT_OTHER): Payer: Self-pay

## 2021-04-24 NOTE — Telephone Encounter (Signed)
Called and spoke with pateint 04/23/2021. She states that her insurance would not pay for her to do the cologard testing. She is going to go through with having colonoscopy done. She will call her gastro physician to get that scheduled.   She also had some complaints about the discomfort in the urethra area. She denies having any discharge or bleeding. She has experienced some urgency and being unable to hold her urine. This symptom has gotten a little better now that school is out. She has an appointment in August for annual but wants to know if she can come in sooner to be evaluated. If not, she would like to seem if there are any recommendations that Dr. Hyacinth Meeker might suggest. tbw

## 2021-06-03 ENCOUNTER — Other Ambulatory Visit: Payer: Self-pay | Admitting: Obstetrics & Gynecology

## 2021-06-03 DIAGNOSIS — Z1231 Encounter for screening mammogram for malignant neoplasm of breast: Secondary | ICD-10-CM

## 2021-06-04 ENCOUNTER — Encounter (HOSPITAL_BASED_OUTPATIENT_CLINIC_OR_DEPARTMENT_OTHER): Payer: Self-pay

## 2021-06-04 ENCOUNTER — Other Ambulatory Visit: Payer: Self-pay | Admitting: *Deleted

## 2021-06-04 MED ORDER — VALACYCLOVIR HCL 1 G PO TABS: ORAL_TABLET | ORAL | 0 refills | Status: DC

## 2021-06-15 ENCOUNTER — Encounter: Payer: Self-pay | Admitting: Physician Assistant

## 2021-06-15 ENCOUNTER — Other Ambulatory Visit: Payer: Self-pay

## 2021-06-15 ENCOUNTER — Ambulatory Visit: Payer: BC Managed Care – PPO

## 2021-06-15 ENCOUNTER — Ambulatory Visit (INDEPENDENT_AMBULATORY_CARE_PROVIDER_SITE_OTHER): Payer: BC Managed Care – PPO | Admitting: Physician Assistant

## 2021-06-15 VITALS — BP 127/73 | HR 84 | Temp 98.6°F | Ht 60.0 in | Wt 124.0 lb

## 2021-06-15 DIAGNOSIS — M79661 Pain in right lower leg: Secondary | ICD-10-CM

## 2021-06-15 DIAGNOSIS — R238 Other skin changes: Secondary | ICD-10-CM | POA: Diagnosis not present

## 2021-06-15 DIAGNOSIS — R194 Change in bowel habit: Secondary | ICD-10-CM | POA: Insufficient documentation

## 2021-06-15 DIAGNOSIS — K602 Anal fissure, unspecified: Secondary | ICD-10-CM | POA: Insufficient documentation

## 2021-06-15 DIAGNOSIS — R233 Spontaneous ecchymoses: Secondary | ICD-10-CM

## 2021-06-15 DIAGNOSIS — R142 Eructation: Secondary | ICD-10-CM | POA: Insufficient documentation

## 2021-06-15 DIAGNOSIS — R141 Gas pain: Secondary | ICD-10-CM | POA: Insufficient documentation

## 2021-06-15 DIAGNOSIS — R1310 Dysphagia, unspecified: Secondary | ICD-10-CM | POA: Insufficient documentation

## 2021-06-15 DIAGNOSIS — K5904 Chronic idiopathic constipation: Secondary | ICD-10-CM | POA: Insufficient documentation

## 2021-06-15 NOTE — Progress Notes (Signed)
Established Patient Office Visit  Subjective:  Patient ID: Morgan Wood, female    DOB: 04/18/1976  Age: 45 y.o. MRN: [id number]  CC:  Chief Complaint  Patient presents with   Leg Pain    HPI Morgan Wood presents for c/o intermittent right lower extremity pain including calf area. Describes pain as a dull ache and sometimes it feels like pins and needles. Patient states she has noticed she bruises easily and recently noticed a triangular bruise at the top of her right foot and unsure how she got it, denies injury or trauma. Patient denies family hx of blood disorders but reports her grandfather has recurrent blood clots. Denies swelling, discoloration, redness, chest pain, shortness of breath, or palpitations. Has not started new medications  or supplements. Does not take aspirin or NSAIDs. No aggravating factors.  Past Medical History:  Diagnosis Date   Allergic rhinitis    Allergy    Asthma    Bronchitis    1-2 episodes yearly   Environmental allergies    rash,swelling in throat,itching   Headache(784.0)    Kidney stone    Laryngitis    2-3episodes yearly    Past Surgical History:  Procedure Laterality Date   bulging disc C3 C4  2006   CYSTOSCOPY/URETEROSCOPY/HOLMIUM LASER/STENT PLACEMENT Right 03/24/2020   Procedure: CYSTOSCOPY/URETEROSCOPY/HOLMIUM LASER/STENT PLACEMENT, RETROGRADE PYLEGRAM.;  Surgeon: Morgan Christmas, MD;  Location: [facility];  Service: Urology;  Laterality: Right;   EUS  10/13/2012   Procedure: UPPER ENDOSCOPIC ULTRASOUND (EUS) LINEAR;  Surgeon: Morgan Belfast, MD;  Location: [facility];  Service: Endoscopy;  Laterality: N/A;   NASAL SEPTUM SURGERY  11/01/2002   UPPER GASTROINTESTINAL ENDOSCOPY  11/13; 12/13   WISDOM TOOTH EXTRACTION      Family History  Problem Relation Age of Onset   Hypertension Father    Diabetes Other    Heart disease Other    Cancer Maternal Grandfather        skin cancer   Heart attack Maternal Grandfather     Emphysema Maternal Grandfather    Parkinsonism Paternal Grandmother    Heart attack Paternal Grandfather    Cancer Maternal Grandmother     Social History   Socioeconomic History   Marital status: Single    Spouse name: Not on file   Number of children: 0   Years of education: Not on file   Highest education level: Not on file  Occupational History   Occupation: school teacher  Tobacco Use   Smoking status: Never   Smokeless tobacco: Never  Vaping Use   Vaping Use: Never used  Substance and Sexual Activity   Alcohol use: No   Drug use: No   Sexual activity: Not Currently    Partners: Male    Birth control/protection: Abstinence  Other Topics Concern   Not on file  Social History Narrative   Not on file   Social Determinants of Health   Financial Resource Strain: Not on file  Food Insecurity: Not on file  Transportation Needs: Not on file  Physical Activity: Not on file  Stress: Not on file  Social Connections: Not on file  Intimate Partner Violence: Not on file    Outpatient Medications Prior to Visit  Medication Sig Dispense Refill   cetirizine (ZYRTEC) 10 MG tablet Take 10 mg by mouth daily.     cholecalciferol (VITAMIN D3) 25 MCG (1000 UNIT) tablet Take 1,000 Units by mouth daily.     ELDERBERRY PO Take 1  tablet by mouth daily.     levalbuterol (XOPENEX HFA) 45 MCG/ACT inhaler Inhale 1-2 puffs into the lungs every 6 (six) hours as needed for wheezing or shortness of breath. prn 1 each 12   Multiple Vitamin (MULTIVITAMIN WITH MINERALS) TABS tablet Take 1 tablet by mouth daily.     omeprazole (PRILOSEC) 40 MG capsule Take 40 mg by mouth as needed.      valACYclovir (VALTREX) 1000 MG tablet Take 2 tabs and repeat in 12 hours.  Start with onset of symptoms. 30 tablet 0   doxycycline (VIBRA-TABS) 100 MG tablet Take 2 tablets on the first day, then 1 tablet daily till gone. (Patient not taking: Reported on 06/15/2021) 8 tablet 0   No facility-administered  medications prior to visit.    Allergies  Allergen Reactions   Albuterol Other (See Comments)    High Heart Rate Jitters   Diclofenac Other (See Comments)    Caused chest pain   Azithromycin Other (See Comments)    Stomach cramps    ROS Review of Systems Review of Systems:  A fourteen system review of systems was performed and found to be positive as per HPI.   Objective:    Physical Exam General:  Well Developed, well nourished, appropriate for stated age.  Neuro:  Alert and oriented,  extra-ocular muscles intact  HEENT:  Normocephalic, atraumatic, neck supple  Skin:  no rash, warm, pink, no petechia or ecchymosis.  Cardiac:  RRR Respiratory:  CTA B/L and A/P, Not using accessory muscles, speaking in full sentences- unlabored. Extremities/Vascular:  Ext warm, no cyanosis apprec.; cap RF less 2 sec. No tenderness and good pedal pusles Psych:  No HI/SI, judgement and insight good, Euthymic mood. Full Affect.  BP 127/73   Pulse 84   Temp 98.6 F (37 C)   Ht 5' (1.524 m)   Wt 124 lb (56.2 kg)   LMP  (LMP Unknown)   SpO2 97%   BMI 24.22 kg/m  Wt Readings from Last 3 Encounters:  06/15/21 124 lb (56.2 kg)  01/05/21 118 lb 6.4 oz (53.7 kg)  06/30/20 123 lb 14.4 oz (56.2 kg)     Health Maintenance Due  Topic Date Due   COLONOSCOPY (Pts 45-24yrs Insurance coverage will need to be confirmed)  Never done   INFLUENZA VACCINE  06/01/2021    There are no preventive care reminders to display for this patient.  Lab Results  Component Value Date   TSH 1.460 04/21/2020   Lab Results  Component Value Date   WBC 6.5 04/21/2020   HGB 12.3 04/21/2020   HCT 37.7 04/21/2020   MCV 90 04/21/2020   PLT 254 04/21/2020   Lab Results  Component Value Date   NA 138 04/21/2020   K 4.0 04/21/2020   CO2 25 04/21/2020   GLUCOSE 89 04/21/2020   BUN 14 04/21/2020   CREATININE 0.76 04/21/2020   BILITOT <0.2 04/21/2020   ALKPHOS 54 04/21/2020   AST 15 04/21/2020   ALT 14  04/21/2020   PROT 6.7 04/21/2020   ALBUMIN 4.2 04/21/2020   CALCIUM 9.3 04/21/2020   ANIONGAP 10 03/23/2020   Lab Results  Component Value Date   CHOL 152 04/21/2020   Lab Results  Component Value Date   HDL 51 04/21/2020   Lab Results  Component Value Date   LDLCALC 87 04/21/2020   Lab Results  Component Value Date   TRIG 70 04/21/2020   Lab Results  Component Value Date  CHOLHDL 3.0 04/21/2020   Lab Results  Component Value Date   HGBA1C 5.4 10/23/2018      Assessment & Plan:   Problem List Items Addressed This Visit   None Visit Diagnoses     Easy bruising    -  Primary   Relevant Orders   CBC w/Diff   APTT   Protime-INR   Right calf pain       Relevant Orders   VAS [place] LOWER EXTREMITY VENOUS (DVT)   APTT   Protime-INR      Easy bruising, Right calf pain: -Reassurance provided. Given patient's concern will collect labs and venous doppler ultrasound for further evaluation.  No orders of the defined types were placed in this encounter.   Follow-up: Return if symptoms worsen or fail to improve.    Mayer Masker, PA-C

## 2021-06-16 ENCOUNTER — Ambulatory Visit (HOSPITAL_COMMUNITY)
Admission: RE | Admit: 2021-06-16 | Discharge: 2021-06-16 | Disposition: A | Discharge status: Home / Self Care | Payer: BC Managed Care – PPO | Source: Ambulatory Visit | Attending: Physician Assistant | Admitting: Physician Assistant

## 2021-06-16 DIAGNOSIS — M79661 Pain in right lower leg: Secondary | ICD-10-CM

## 2021-06-16 LAB — CBC WITH DIFFERENTIAL/PLATELET
Basophils Absolute: 0.1 10*3/uL (ref 0.0–0.2)
Basos: 1 %
EOS (ABSOLUTE): 0.2 10*3/uL (ref 0.0–0.4)
Eos: 2 %
Hematocrit: 40.3 % (ref 34.0–46.6)
Hemoglobin: 13.6 g/dL (ref 11.1–15.9)
Immature Grans (Abs): 0 10*3/uL (ref 0.0–0.1)
Immature Granulocytes: 0 %
Lymphocytes Absolute: 2.1 10*3/uL (ref 0.7–3.1)
Lymphs: 25 %
MCH: 28.9 pg (ref 26.6–33.0)
MCHC: 33.7 g/dL (ref 31.5–35.7)
MCV: 86 fL (ref 79–97)
Monocytes Absolute: 0.7 10*3/uL (ref 0.1–0.9)
Monocytes: 9 %
Neutrophils Absolute: 5.2 10*3/uL (ref 1.4–7.0)
Neutrophils: 63 %
Platelets: 280 10*3/uL (ref 150–450)
RBC: 4.7 x10E6/uL (ref 3.77–5.28)
RDW: 12.9 % (ref 11.7–15.4)
WBC: 8.3 10*3/uL (ref 3.4–10.8)

## 2021-06-16 LAB — PROTIME-INR
INR: 1 (ref 0.9–1.2)
Prothrombin Time: 10.5 s (ref 9.1–12.0)

## 2021-06-16 LAB — APTT: aPTT: 30 s (ref 24–33)

## 2021-06-17 ENCOUNTER — Other Ambulatory Visit: Payer: Self-pay

## 2021-06-17 ENCOUNTER — Ambulatory Visit (INDEPENDENT_AMBULATORY_CARE_PROVIDER_SITE_OTHER): Payer: BC Managed Care – PPO | Admitting: Obstetrics & Gynecology

## 2021-06-17 ENCOUNTER — Encounter (HOSPITAL_BASED_OUTPATIENT_CLINIC_OR_DEPARTMENT_OTHER): Payer: Self-pay | Admitting: Obstetrics & Gynecology

## 2021-06-17 VITALS — BP 131/89 | HR 81 | Ht 63.0 in | Wt 124.6 lb

## 2021-06-17 DIAGNOSIS — Z01419 Encounter for gynecological examination (general) (routine) without abnormal findings: Secondary | ICD-10-CM

## 2021-06-17 DIAGNOSIS — K069 Disorder of gingiva and edentulous alveolar ridge, unspecified: Secondary | ICD-10-CM | POA: Diagnosis not present

## 2021-06-17 DIAGNOSIS — J301 Allergic rhinitis due to pollen: Secondary | ICD-10-CM

## 2021-06-17 DIAGNOSIS — N200 Calculus of kidney: Secondary | ICD-10-CM | POA: Diagnosis not present

## 2021-06-17 DIAGNOSIS — B009 Herpesviral infection, unspecified: Secondary | ICD-10-CM

## 2021-06-17 MED ORDER — VALACYCLOVIR HCL 1 G PO TABS: ORAL_TABLET | ORAL | 2 refills | Status: DC

## 2021-06-17 NOTE — Progress Notes (Signed)
45 y.o. G0P0000 Single White or Caucasian female here for annual exam.  Doing well.    Had CT scan done 11/2020 and lung nodule was noted.  Saw pulmonologist and follow up 1 year imaging recommended.  Cycles are still regular.  Flow lasts 3 days.      Sexually active: Yes.    The current method of family planning is abstinence.    Exercising: Yes.    Walking and softball Smoker:  no  Health Maintenance: Pap:  04/21/2020 Negative History of abnormal Pap:  yes in 2014 MMG:  06/2019 Colonoscopy:  cologuard not covered, will have colonoscopy with Dr. Loreta Ave TDaP:  2015 Hep C testing: 2019 Screening Labs: has blood work planned for 07/01/2021   reports that she has never smoked. She has never used smokeless tobacco. She reports that she does not drink alcohol and does not use drugs.  Past Medical History:  Diagnosis Date   Allergic rhinitis    Allergy    Asthma    Bronchitis    1-2 episodes yearly   Environmental allergies    rash,swelling in throat,itching   Headache(784.0)    Kidney stone    Laryngitis    2-3episodes yearly    Past Surgical History:  Procedure Laterality Date   bulging disc C3 C4  2006   CYSTOSCOPY/URETEROSCOPY/HOLMIUM LASER/STENT PLACEMENT Right 03/24/2020   Procedure: CYSTOSCOPY/URETEROSCOPY/HOLMIUM LASER/STENT PLACEMENT, RETROGRADE PYLEGRAM.;  Surgeon: Noel Christmas, MD;  Location: [facility];  Service: Urology;  Laterality: Right;   EUS  10/13/2012   Procedure: UPPER ENDOSCOPIC ULTRASOUND (EUS) LINEAR;  Surgeon: Theda Belfast, MD;  Location: [facility];  Service: Endoscopy;  Laterality: N/A;   NASAL SEPTUM SURGERY  11/01/2002   UPPER GASTROINTESTINAL ENDOSCOPY  11/13; 12/13   WISDOM TOOTH EXTRACTION      Current Outpatient Medications  Medication Sig Dispense Refill   cetirizine (ZYRTEC) 10 MG tablet Take 10 mg by mouth daily.     cholecalciferol (VITAMIN D3) 25 MCG (1000 UNIT) tablet Take 1,000 Units by mouth daily.     ELDERBERRY PO Take 1 tablet by  mouth daily.     levalbuterol (XOPENEX HFA) 45 MCG/ACT inhaler Inhale 1-2 puffs into the lungs every 6 (six) hours as needed for wheezing or shortness of breath. prn 1 each 12   Multiple Vitamin (MULTIVITAMIN WITH MINERALS) TABS tablet Take 1 tablet by mouth daily.     omeprazole (PRILOSEC) 40 MG capsule Take 40 mg by mouth as needed.      valACYclovir (VALTREX) 1000 MG tablet Take 2 tabs and repeat in 12 hours.  Start with onset of symptoms. 30 tablet 2   No current facility-administered medications for this visit.    Family History  Problem Relation Age of Onset   Hypertension Father    Diabetes Other    Heart disease Other    Cancer Maternal Grandfather        skin cancer   Heart attack Maternal Grandfather    Emphysema Maternal Grandfather    Parkinsonism Paternal Grandmother    Heart attack Paternal Grandfather    Cancer Maternal Grandmother     Review of Systems  All other systems reviewed and are negative.  Exam:   BP 131/89 (BP Location: Right Arm, Patient Position: Sitting, Cuff Size: Small)   Pulse 81   Ht 5\' 3"  (1.6 m)   Wt 124 lb 9.6 oz (56.5 kg)   LMP 05/29/2021 (Approximate)   BMI 22.07 kg/m   Height: 5\' 3"  (  160 cm)  General appearance: alert, cooperative and appears stated age Head: Normocephalic, without obvious abnormality, atraumatic Neck: no adenopathy, supple, symmetrical, trachea midline and thyroid normal to inspection and palpation Lungs: clear to auscultation bilaterally Breasts: normal appearance, no masses or tenderness Heart: regular rate and rhythm Abdomen: soft, non-tender; bowel sounds normal; no masses,  no organomegaly Extremities: extremities normal, atraumatic, no cyanosis or edema Skin: Skin color, texture, turgor normal. No rashes or lesions Lymph nodes: Cervical, supraclavicular, and axillary nodes normal. No abnormal inguinal nodes palpated Neurologic: Grossly normal   Pelvic: External genitalia:  no lesions              Urethra:   normal appearing urethra with no masses, tenderness or lesions              Bartholins and Skenes: normal                 Vagina: normal appearing vagina with normal color and no discharge, no lesions              Cervix: no lesions              Pap taken: No. Bimanual Exam:  Uterus:  normal size, contour, position, consistency, mobility, non-tender              Adnexa: normal adnexa and no mass, fullness, tenderness               Rectovaginal: Confirms               Anus:  normal sphincter tone, no lesions  Chaperone, Ina Homes, CMA, was present for exam.  Assessment/Plan: 1. Well woman exam with routine gynecological exam - pap smear neg with neg HR HPV 2021.  Not indicated today. - MMG 06/2019.  Scheduled for 07/23/2021. - colonoscopy is planned with Dr. Loreta Ave - vaccines updated - blood work scheduled for 07/01/2021  2. Renal stone - followed by urology  3. Seasonal allergic rhinitis due to pollen  4. 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  Start with onset of symptoms.  Dispense: 30 tablet; Refill: 2

## 2021-06-19 DIAGNOSIS — B009 Herpesviral infection, unspecified: Secondary | ICD-10-CM | POA: Insufficient documentation

## 2021-07-01 ENCOUNTER — Encounter: Payer: BC Managed Care – PPO | Admitting: Physician Assistant

## 2021-07-01 ENCOUNTER — Other Ambulatory Visit: Payer: Self-pay

## 2021-07-01 ENCOUNTER — Ambulatory Visit (INDEPENDENT_AMBULATORY_CARE_PROVIDER_SITE_OTHER): Payer: BC Managed Care – PPO | Admitting: Physician Assistant

## 2021-07-01 ENCOUNTER — Encounter: Payer: Self-pay | Admitting: Physician Assistant

## 2021-07-01 VITALS — BP 110/76 | HR 84 | Temp 98.5°F | Ht 63.0 in | Wt 122.9 lb

## 2021-07-01 DIAGNOSIS — E559 Vitamin D deficiency, unspecified: Secondary | ICD-10-CM | POA: Diagnosis not present

## 2021-07-01 DIAGNOSIS — Z1329 Encounter for screening for other suspected endocrine disorder: Secondary | ICD-10-CM

## 2021-07-01 DIAGNOSIS — Z130 Encounter for screening for diseases of the blood and blood-forming organs and certain disorders involving the immune mechanism: Secondary | ICD-10-CM

## 2021-07-01 DIAGNOSIS — Z13228 Encounter for screening for other metabolic disorders: Secondary | ICD-10-CM | POA: Diagnosis not present

## 2021-07-01 DIAGNOSIS — Z0000 Encounter for general adult medical examination without abnormal findings: Secondary | ICD-10-CM

## 2021-07-01 NOTE — Patient Instructions (Signed)
Preventive Care 46-46 Years Old, Female Preventive care refers to lifestyle choices and visits with your health care provider that can promote health and wellness. This includes: A yearly physical exam. This is also called an annual wellness visit. Regular dental and eye exams. Immunizations. Screening for certain conditions. Healthy lifestyle choices, such as: Eating a healthy diet. Getting regular exercise. Not using drugs or products that contain nicotine and tobacco. Limiting alcohol use. What can I expect for my preventive care visit? Physical exam Your health care provider will check your: Height and weight. These may be used to calculate your BMI (body mass index). BMI is a measurement that tells if you are at a healthy weight. Heart rate and blood pressure. Body temperature. Skin for abnormal spots. Counseling Your health care provider may ask you questions about your: Past medical problems. Family's medical history. Alcohol, tobacco, and drug use. Emotional well-being. Home life and relationship well-being. Sexual activity. Diet, exercise, and sleep habits. Work and work environment. Access to firearms. Method of birth control. Menstrual cycle. Pregnancy history. What immunizations do I need? Vaccines are usually given at various ages, according to a schedule. Your health care provider will recommend vaccines for you based on your age, medical history, and lifestyle or other factors, such as travel or where you work. What tests do I need? Blood tests Lipid and cholesterol levels. These may be checked every 5 years, or more often if you are over 46 years old. Hepatitis C test. Hepatitis B test. Screening Lung cancer screening. You may have this screening every year starting at age 46 if you have a 30-pack-year history of smoking and currently smoke or have quit within the past 15 years. Colorectal cancer screening. All adults should have this screening starting at  age 50 and continuing until age 75. Your health care provider may recommend screening at age 45 if you are at increased risk. You will have tests every 1-10 years, depending on your results and the type of screening test. Diabetes screening. This is done by checking your blood sugar (glucose) after you have not eaten for a while (fasting). You may have this done every 1-3 years. Mammogram. This may be done every 1-2 years. Talk with your health care provider about when you should start having regular mammograms. This may depend on whether you have a family history of breast cancer. BRCA-related cancer screening. This may be done if you have a family history of breast, ovarian, tubal, or peritoneal cancers. Pelvic exam and Pap test. This may be done every 3 years starting at age 46. Starting at age 46, this may be done every 5 years if you have a Pap test in combination with an HPV test. Other tests STD (sexually transmitted disease) testing, if you are at risk. Bone density scan. This is done to screen for osteoporosis. You may have this scan if you are at high risk for osteoporosis. Talk with your health care provider about your test results, treatment options, and if necessary, the need for more tests. Follow these instructions at home: Eating and drinking  Eat a diet that includes fresh fruits and vegetables, whole grains, lean protein, and low-fat dairy products. Take vitamin and mineral supplements as recommended by your health care provider. Do not drink alcohol if: Your health care provider tells you not to drink. You are pregnant, may be pregnant, or are planning to become pregnant. If you drink alcohol: Limit how much you have to 0-1 drink a day. Be   aware of how much alcohol is in your drink. In the [place] one drink equals one 12 oz bottle of beer (355 mL), one 5 oz glass of wine (148 mL), or one 1 oz glass of hard liquor (44 mL). Lifestyle Take daily care of your teeth and  gums. Brush your teeth every morning and night with fluoride toothpaste. Floss one time each day. Stay active. Exercise for at least 30 minutes 5 or more days each week. Do not use any products that contain nicotine or tobacco, such as cigarettes, e-cigarettes, and chewing tobacco. If you need help quitting, ask your health care provider. Do not use drugs. If you are sexually active, practice safe sex. Use a condom or other form of protection to prevent STIs (sexually transmitted infections). If you do not wish to become pregnant, use a form of birth control. If you plan to become pregnant, see your health care provider for a prepregnancy visit. If told by your health care provider, take low-dose aspirin daily starting at age 46. Find healthy ways to cope with stress, such as: Meditation, yoga, or listening to music. Journaling. Talking to a trusted person. Spending time with friends and family. Safety Always wear your seat belt while driving or riding in a vehicle. Do not drive: If you have been drinking alcohol. Do not ride with someone who has been drinking. When you are tired or distracted. While texting. Wear a helmet and other protective equipment during sports activities. If you have firearms in your house, make sure you follow all gun safety procedures. What's next? Visit your health care provider once a year for an annual wellness visit. Ask your health care provider how often you should have your eyes and teeth checked. Stay up to date on all vaccines. This information is not intended to replace advice given to you by your health care provider. Make sure you discuss any questions you have with your health care provider. Document Revised: 12/26/2020 Document Reviewed: 06/29/2018 Elsevier Patient Education  2022 [employer].

## 2021-07-01 NOTE — Progress Notes (Signed)
Subjective:     Morgan Wood is a 45 y.o. female and is here for a comprehensive physical exam. The patient reports no problems.  Social History   Socioeconomic History   Marital status: Single    Spouse name: Not on file   Number of children: 0   Years of education: Not on file   Highest education level: Not on file  Occupational History   Occupation: school teacher  Tobacco Use   Smoking status: Never   Smokeless tobacco: Never  Vaping Use   Vaping Use: Never used  Substance and Sexual Activity   Alcohol use: No   Drug use: No   Sexual activity: Not Currently    Partners: Male    Birth control/protection: Abstinence  Other Topics Concern   Not on file  Social History Narrative   Not on file   Social Determinants of Health   Financial Resource Strain: Not on file  Food Insecurity: Not on file  Transportation Needs: Not on file  Physical Activity: Not on file  Stress: Not on file  Social Connections: Not on file  Intimate Partner Violence: Not on file   Health Maintenance  Topic Date Due   COLONOSCOPY (Pts 45-31yrs Insurance coverage will need to be confirmed)  Never done   INFLUENZA VACCINE  06/01/2021   PAP SMEAR-Modifier  04/22/2023   TETANUS/TDAP  11/02/2023   COVID-19 Vaccine  Completed   Hepatitis C Screening  Completed   HIV Screening  Completed   Pneumococcal Vaccine 51-63 Years old  Aged Out   HPV VACCINES  Aged Out    The following portions of the patient's history were reviewed and updated as appropriate: allergies, current medications, past family history, past medical history, past social history, past surgical history, and problem list.  Review of Systems Pertinent items noted in HPI and remainder of comprehensive ROS otherwise negative.   Objective:    BP 110/76   Pulse 84   Temp 98.5 F (36.9 C)   Ht 5\' 3"  (1.6 m)   Wt 122 lb 14.4 oz (55.7 kg)   SpO2 99%   BMI 21.77 kg/m  General appearance: alert, cooperative, and no  distress Head: Normocephalic, without obvious abnormality, atraumatic Eyes: conjunctivae/corneas clear. PERRL, EOM's intact. Fundi benign. Ears: normal TM's and external ear canals both ears Nose: Nares normal. Septum midline. Mucosa normal. No drainage or sinus tenderness. Throat: lips, mucosa, and tongue normal; teeth and gums normal Neck: no adenopathy, no carotid bruit, no JVD, supple, symmetrical, trachea midline, and thyroid: normal to inspection and palpation Back: symmetric, no curvature. ROM normal. No CVA tenderness. Lungs: clear to auscultation bilaterally Heart: regular rate and rhythm, S1, S2 normal, no murmur, click, rub or gallop Abdomen: soft, non-tender; bowel sounds normal; no masses,  no organomegaly Extremities: extremities normal, atraumatic, no cyanosis or edema Pulses: 2+ and symmetric Skin: Skin color, texture, turgor normal. No rashes or lesions Lymph nodes: Cervical adenopathy: normal and Supraclavicular adenopathy: normal Neurologic: Grossly normal    Assessment:    Healthy female exam.     Plan:  -Will obtain fasting labs. -Patient plans to contact Dr. for colonoscopy, referral placed by her OB/GYN. -Followed by OB/GYN. UTD pap smear. Scheduled for mammogram 07/23/21. -UTD tdap. -Follow a heart healthy diet and continue to stay as active as possible. -Follow up in 1 yr for CPE and FBW or sooner if needed  See After Visit Summary for Counseling Recommendations

## 2021-07-02 LAB — CBC WITH DIFFERENTIAL/PLATELET
Basophils Absolute: 0.1 10*3/uL (ref 0.0–0.2)
Basos: 1 %
EOS (ABSOLUTE): 0.1 10*3/uL (ref 0.0–0.4)
Eos: 2 %
Hematocrit: 41.4 % (ref 34.0–46.6)
Hemoglobin: 14 g/dL (ref 11.1–15.9)
Immature Grans (Abs): 0 10*3/uL (ref 0.0–0.1)
Immature Granulocytes: 0 %
Lymphocytes Absolute: 2.1 10*3/uL (ref 0.7–3.1)
Lymphs: 25 %
MCH: 29.8 pg (ref 26.6–33.0)
MCHC: 33.8 g/dL (ref 31.5–35.7)
MCV: 88 fL (ref 79–97)
Monocytes Absolute: 0.8 10*3/uL (ref 0.1–0.9)
Monocytes: 9 %
Neutrophils Absolute: 5.2 10*3/uL (ref 1.4–7.0)
Neutrophils: 63 %
Platelets: 285 10*3/uL (ref 150–450)
RBC: 4.7 x10E6/uL (ref 3.77–5.28)
RDW: 13.5 % (ref 11.7–15.4)
WBC: 8.3 10*3/uL (ref 3.4–10.8)

## 2021-07-02 LAB — COMPREHENSIVE METABOLIC PANEL
ALT: 17 IU/L (ref 0–32)
AST: 23 IU/L (ref 0–40)
Albumin/Globulin Ratio: 2.1 (ref 1.2–2.2)
Albumin: 4.9 g/dL — ABNORMAL HIGH (ref 3.8–4.8)
Alkaline Phosphatase: 55 IU/L (ref 44–121)
BUN/Creatinine Ratio: 26 — ABNORMAL HIGH (ref 9–23)
BUN: 20 mg/dL (ref 6–24)
Bilirubin Total: 0.4 mg/dL (ref 0.0–1.2)
CO2: 23 mmol/L (ref 20–29)
Calcium: 9.5 mg/dL (ref 8.7–10.2)
Chloride: 101 mmol/L (ref 96–106)
Creatinine, Ser: 0.77 mg/dL (ref 0.57–1.00)
Globulin, Total: 2.3 g/dL (ref 1.5–4.5)
Sodium: 140 mmol/L (ref 134–144)
Total Protein: 7.2 g/dL (ref 6.0–8.5)
eGFR: 96 mL/min/{1.73_m2} (ref >59)

## 2021-07-02 LAB — TSH: TSH: 1.46 u[IU]/mL (ref 0.450–4.500)

## 2021-07-02 LAB — LIPID PANEL
Chol/HDL Ratio: 3 ratio (ref 0.0–4.4)
Cholesterol, Total: 172 mg/dL (ref 100–199)
HDL: 58 mg/dL (ref >39)
LDL Chol Calc (NIH): 101 mg/dL — ABNORMAL HIGH (ref 0–99)
Triglycerides: 66 mg/dL (ref 0–149)
VLDL Cholesterol Cal: 13 mg/dL (ref 5–40)

## 2021-07-02 LAB — VITAMIN D 25 HYDROXY (VIT D DEFICIENCY, FRACTURES): Vit D, 25-Hydroxy: 43.3 ng/mL (ref 30.0–100.0)

## 2021-07-02 LAB — HEMOGLOBIN A1C
Est. average glucose Bld gHb Est-mCnc: 108 mg/dL
Hgb A1c MFr Bld: 5.4 % (ref 4.8–5.6)

## 2021-07-23 ENCOUNTER — Ambulatory Visit
Admission: RE | Admit: 2021-07-23 | Discharge: 2021-07-23 | Disposition: A | Discharge status: Home / Self Care | Payer: BC Managed Care – PPO | Source: Ambulatory Visit | Attending: Obstetrics & Gynecology | Admitting: Obstetrics & Gynecology

## 2021-07-23 ENCOUNTER — Other Ambulatory Visit: Payer: Self-pay

## 2021-07-23 DIAGNOSIS — Z1231 Encounter for screening mammogram for malignant neoplasm of breast: Secondary | ICD-10-CM

## 2021-09-07 ENCOUNTER — Other Ambulatory Visit: Payer: Self-pay

## 2021-09-07 ENCOUNTER — Ambulatory Visit: Payer: BC Managed Care – PPO | Admitting: Internal Medicine

## 2021-09-07 ENCOUNTER — Encounter: Payer: Self-pay | Admitting: Internal Medicine

## 2021-09-07 VITALS — BP 90/66 | HR 100 | Temp 98.9°F | Ht 63.0 in | Wt 120.4 lb

## 2021-09-07 DIAGNOSIS — J301 Allergic rhinitis due to pollen: Secondary | ICD-10-CM | POA: Diagnosis not present

## 2021-09-07 DIAGNOSIS — K219 Gastro-esophageal reflux disease without esophagitis: Secondary | ICD-10-CM | POA: Diagnosis not present

## 2021-09-07 DIAGNOSIS — J0101 Acute recurrent maxillary sinusitis: Secondary | ICD-10-CM | POA: Diagnosis not present

## 2021-09-07 DIAGNOSIS — J4520 Mild intermittent asthma, uncomplicated: Secondary | ICD-10-CM

## 2021-09-07 MED ORDER — CEFDINIR 300 MG PO CAPS
300.0000 mg | ORAL_CAPSULE | Freq: Two times a day (BID) | ORAL | 0 refills | Status: DC
Start: 2021-09-07 — End: 2021-11-10

## 2021-09-07 NOTE — Progress Notes (Signed)
Patient ID: Morgan Wood, female    DOB: 08-09-1976, 45 y.o.   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 Discussed incidental lung nodule noted on CT abd during w/u for kidney stone. Discussed pneumonia vaccine. 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 OTC not helping and saline rinse not helping enough. Notes she did better before stopping allergy shots around 2017 and we discussed referral to allergist.  Having some heart burn when not taking Prilosec.    ROS-see HPI + = positive Constitutional:   No-   weight loss, night sweats, +fevers, chills, fatigue, lassitude. HEENT:   + headaches, difficulty swallowing, tooth/dental problems, +sore throat,       No-  sneezing, itching, ear ache, +nasal congestion, +post nasal drip,  CV:  No-   chest pain, orthopnea, PND, swelling in lower extremities, anasarca, dizziness, palpitations Resp: No-   shortness of breath with exertion or at rest.              +productive cough,  +  non-productive cough,  No- coughing up of blood.              No-   change in color of mucus.  No- wheezing.   Skin: No-   rash or lesions. GI:  No-   heartburn, indigestion, abdominal pain, nausea, vomiting,  GU: . MS:  No-   joint pain or swelling.   Neuro-     nothing unusual Psych:  No- change in mood or affect. No depression or anxiety.  No memory loss.  Objective:   Physical Exam amb wf nad General- Alert, Oriented, Affect-appropriate, Distress- none acute, slender Skin- rash-none, lesions- none, excoriation- none Lymphadenopathy- none Head- atraumatic            Eyes- Gross vision intact, PERRLA, conjunctivae clear secretions            Ears- Hearing, canals-normal, TMs clear            Nose-  turbinate edema, no-Septal dev, polyps, erosion, perforation             Throat- Mallampati II , mucosa clear , drainage- none, tonsils- present Neck- flexible , trachea midline, no stridor , thyroid nl, carotid no bruit Chest - symmetrical excursion , unlabored           Heart/CV- RRR , no murmur , no gallop  , no rub, nl s1 s2                            JVD- trace/ fills from above , edema- none, stasis changes- none, varices- none           Lung- clear to P&A, wheeze- none, cough- none , dullness-none, rub-  none           Chest wall-  Abd-  Soft, no tenderness, with particular attention to left upper quadrant Extrem- cyanosis- none, clubbing, none, atrophy- none, strength- nl

## 2021-09-07 NOTE — Assessment & Plan Note (Signed)
Did better in bast on allergy shots Plan- Refer to allergist

## 2021-09-07 NOTE — Assessment & Plan Note (Signed)
Discussed cefdinir and saline rinse Script sent

## 2021-09-07 NOTE — Assessment & Plan Note (Signed)
Maybe more symptomatic. She will discuss with her GI.

## 2021-09-07 NOTE — Telephone Encounter (Signed)
CY please advise. Thanks  Morgan Wood [facility],  I know it is Sunday but wanted to go ahead and send my message. I am having sinus pressure in my face and ears.  My throat is a little sore mostly in the mornings when I get up. I am sneezing, headache from pressure, a cough from time to time, some drainage and elevated temperature but no fever. My Covid test was negative taken Saturday morning. This has going on for about 4 days now. I have used Tylenol sinus and taken DayQuil. cold and flu. It has helped a little but not going away completely. Please advise.   Thank you,  Morgan Wood

## 2021-09-07 NOTE — Patient Instructions (Addendum)
Cefdinir antibiotic sent  Order- referral to [facility] on [address]     dx seasonal and perennial allergic rhinitis   Ok to continue sinus saline rinse and otc support meds as needed  Ok to keep appointment in March  Please call if we can help

## 2021-09-10 DIAGNOSIS — J209 Acute bronchitis, unspecified: Secondary | ICD-10-CM

## 2021-09-10 NOTE — Telephone Encounter (Signed)
She should continue Cefdinir antibiotic, stay well- hydrated, and can add Mucinex to help loosen mucus.   Can she swing by tomorrow morning for outpatient CXR and CBC w diff- dx Acute bronchitis When I see those, I can decide about changing or adding another antibiotic.

## 2021-09-10 NOTE — Telephone Encounter (Signed)
Please advise on patient mychart message    Dr. Maple Hudson prescribed an antibiotic for me and I can tell that it is trying to break up everything because I am coughing up more green stuff. I do have a concern though still feel rough and my fever is not going away if I don't take Tylenol. Last night it got to 100.8, I am still very congested and the pressure comes and goes, is there something else I need. I still have 4 more days of meds, however with the long weekend coming up I didn't want not to ask. I have been out of work all week and I really need to be able to go back next week.

## 2021-09-11 ENCOUNTER — Telehealth: Payer: Self-pay | Admitting: Internal Medicine

## 2021-09-11 ENCOUNTER — Ambulatory Visit: Payer: BC Managed Care – PPO

## 2021-09-11 ENCOUNTER — Ambulatory Visit (INDEPENDENT_AMBULATORY_CARE_PROVIDER_SITE_OTHER): Payer: BC Managed Care – PPO

## 2021-09-11 ENCOUNTER — Other Ambulatory Visit (INDEPENDENT_AMBULATORY_CARE_PROVIDER_SITE_OTHER): Payer: BC Managed Care – PPO

## 2021-09-11 DIAGNOSIS — J209 Acute bronchitis, unspecified: Secondary | ICD-10-CM

## 2021-09-11 LAB — CBC WITH DIFFERENTIAL/PLATELET
Basophils Absolute: 0.1 10*3/uL (ref 0.0–0.1)
Basophils Relative: 0.8 % (ref 0.0–3.0)
Eosinophils Absolute: 0.2 10*3/uL (ref 0.0–0.7)
Eosinophils Relative: 1.6 % (ref 0.0–5.0)
HCT: 39.9 % (ref 36.0–46.0)
Hemoglobin: 13.3 g/dL (ref 12.0–15.0)
Lymphocytes Relative: 21.9 % (ref 12.0–46.0)
Lymphs Abs: 2.2 10*3/uL (ref 0.7–4.0)
MCHC: 33.4 g/dL (ref 30.0–36.0)
MCV: 90.2 fl (ref 78.0–100.0)
Monocytes Absolute: 1.2 10*3/uL — ABNORMAL HIGH (ref 0.1–1.0)
Monocytes Relative: 11.8 % (ref 3.0–12.0)
Neutro Abs: 6.4 10*3/uL (ref 1.4–7.7)
Neutrophils Relative %: 63.9 % (ref 43.0–77.0)
Platelets: 353 10*3/uL (ref 150.0–400.0)
RBC: 4.42 Mil/uL (ref 3.87–5.11)
RDW: 12.9 % (ref 11.5–15.5)
WBC: 10 10*3/uL (ref 4.0–10.5)

## 2021-09-11 NOTE — Telephone Encounter (Signed)
disregard

## 2021-11-03 ENCOUNTER — Other Ambulatory Visit: Payer: Self-pay

## 2021-11-03 ENCOUNTER — Ambulatory Visit (INDEPENDENT_AMBULATORY_CARE_PROVIDER_SITE_OTHER)
Admission: RE | Admit: 2021-11-03 | Discharge: 2021-11-03 | Disposition: A | Discharge status: Home / Self Care | Payer: BC Managed Care – PPO | Source: Ambulatory Visit | Attending: Internal Medicine | Admitting: Internal Medicine

## 2021-11-03 DIAGNOSIS — R911 Solitary pulmonary nodule: Secondary | ICD-10-CM | POA: Diagnosis not present

## 2021-11-04 ENCOUNTER — Other Ambulatory Visit: Payer: Self-pay

## 2021-11-04 ENCOUNTER — Telehealth: Payer: Self-pay | Admitting: Internal Medicine

## 2021-11-04 DIAGNOSIS — R918 Other nonspecific abnormal finding of lung field: Secondary | ICD-10-CM

## 2021-11-04 DIAGNOSIS — R911 Solitary pulmonary nodule: Secondary | ICD-10-CM

## 2021-11-04 NOTE — Telephone Encounter (Signed)
Waymon Budge, MD  11/04/2021 11:03 AM EST     CT chest- there are several small lung nodules which are very nonspecific. They are likely benign. [facility] advice is to look at them again in 6 months to make sure they aren't growing. Order- future CT chest no contrast 6 months   multiple lung nodules up to 7 mm.    Patient is aware of results and voiced her understanding.  CT ordered. Nothing further needed at this time.

## 2021-11-05 ENCOUNTER — Other Ambulatory Visit: Payer: BC Managed Care – PPO

## 2021-11-10 ENCOUNTER — Encounter: Payer: Self-pay | Admitting: Allergy and Immunology

## 2021-11-10 ENCOUNTER — Ambulatory Visit (INDEPENDENT_AMBULATORY_CARE_PROVIDER_SITE_OTHER): Payer: BC Managed Care – PPO | Admitting: Allergy and Immunology

## 2021-11-10 ENCOUNTER — Other Ambulatory Visit: Payer: Self-pay

## 2021-11-10 VITALS — BP 110/72 | HR 83 | Temp 98.1°F | Resp 16 | Ht 63.0 in | Wt 120.0 lb

## 2021-11-10 DIAGNOSIS — J4520 Mild intermittent asthma, uncomplicated: Secondary | ICD-10-CM | POA: Diagnosis not present

## 2021-11-10 DIAGNOSIS — J301 Allergic rhinitis due to pollen: Secondary | ICD-10-CM

## 2021-11-10 DIAGNOSIS — J3089 Other allergic rhinitis: Secondary | ICD-10-CM

## 2021-11-10 MED ORDER — ALBUTEROL SULFATE HFA 108 (90 BASE) MCG/ACT IN AERS: 2.0000 | INHALATION_SPRAY | RESPIRATORY_TRACT | 1 refills | Status: DC | PRN

## 2021-11-10 MED ORDER — RYALTRIS 665-25 MCG/ACT NA SUSP: 2.0000 | Freq: Two times a day (BID) | NASAL | 1 refills | Status: DC

## 2021-11-10 MED ORDER — OLOPATADINE HCL 0.2 % OP SOLN: 1.0000 [drp] | Freq: Once | OPHTHALMIC | 5 refills | Status: AC

## 2021-11-10 MED ORDER — CETIRIZINE HCL 10 MG PO TABS: 10.0000 mg | ORAL_TABLET | Freq: Two times a day (BID) | ORAL | 5 refills | Status: AC | PRN

## 2021-11-10 NOTE — Progress Notes (Signed)
[place] [facility] [place] - [place] - [place] - [place]   Dear Dr. Annamaria Boots,  Thank you for referring Morgan Wood to the [facility] on 11/10/2021.   Below is a summation of this patient's evaluation and recommendations.  Thank you for your referral. I will keep you informed about this patient's response to treatment.   If you have any questions please do not hesitate to contact me.   Sincerely,  Jiles Prows, MD Allergy / Immunology [place]   ______________________________________________________________________    NEW PATIENT NOTE  Referring Provider: Deneise Lever, MD Primary Provider: Lorrene Reid, PA-C Date of office visit: 11/10/2021    Subjective:   Chief Complaint:  Morgan Wood (DOB: 21-Nov-1975) is a 46 y.o. female who presents to the clinic on 11/10/2021 with a chief complaint of Asthma (Says her asthma is well. Says she has exercise induced asthma. ), Allergic Rhinitis  (Says she is having issues with seasonal allergies. Her lips were chapped and swelled up after she consumed salt and there was very cold wheatear. She is unsure if there was an allergy to foods or the cold weather. ), Sinus Problem (She has had issues and they have been tough on her. ), and Immunotherapy (She did shots for five years and has been off for the past six years. ) .     HPI: Morgan Wood presents to this clinic in evaluation of allergic disease and asthma.  She is a long-term patient of Dr. Keturah Barre and received immunotherapy up until 5 years ago.  She did very well with immunotherapy and even after discontinuing immunotherapy she did well up until the spring 2022.  At that point in time she started to develop problems with nasal congestion and sneezing and she had some issues with her lips occasionally swelling up and she felt as though she had an issue with chapping of her lips.   Even in the face of consistently using Zyrtec on a daily basis she continued to have these problems.  She knows for sure that exposure to cats and pine trees and evergreen trees and grasses precipitates this issue.  As well, when she gets around grasses or insulation she gets extremely itchy.  Occasionally she does have some issues with her eyes with itchiness.  Occasionally she will get the sensation that her ears are somewhat full and do not feel very comfortable.  This is not associated with any vertigo or tinnitus or hearing loss.  Her asthma has been relatively nonexistent.  She can exercise without any difficulty and she can have cold air exposure for the most part without any problem.  She does not use a short acting bronchodilator.  She has not required a systemic steroid in years.  Past Medical History:  Diagnosis Date   Allergic rhinitis    Allergy    Asthma    Bronchitis    1-2 episodes yearly   Environmental allergies    rash,swelling in throat,itching   Headache(784.0)    Kidney stone    Laryngitis    2-3episodes yearly    Past Surgical History:  Procedure Laterality Date   bulging disc C3 C4  2006   CYSTOSCOPY/URETEROSCOPY/HOLMIUM LASER/STENT PLACEMENT Right 03/24/2020   Procedure: CYSTOSCOPY/URETEROSCOPY/HOLMIUM LASER/STENT PLACEMENT, RETROGRADE PYLEGRAM.;  Surgeon: Robley Fries, MD;  Location: [facility];  Service: Urology;  Laterality: Right;   EUS  10/13/2012   Procedure: UPPER  ENDOSCOPIC ULTRASOUND (EUS) LINEAR;  Surgeon: Beryle Beams, MD;  Location: [facility];  Service: Endoscopy;  Laterality: N/A;   NASAL SEPTUM SURGERY  11/01/2002   UPPER GASTROINTESTINAL ENDOSCOPY  11/13; 12/13   WISDOM TOOTH EXTRACTION      Allergies as of 11/10/2021       Reactions   Albuterol Other (See Comments)   High Heart Rate Jitters   Diclofenac Other (See Comments)   Caused chest pain Other reaction(s): chest pain   Azithromycin Other (See Comments)   Stomach cramps Other  reaction(s): stomach upset        Medication List    cetirizine 10 MG tablet Commonly known as: ZYRTEC Take 10 mg by mouth daily.   cholecalciferol 25 MCG (1000 UNIT) tablet Commonly known as: VITAMIN D3 Take 1,000 Units by mouth daily.   Flowflex COVID-19 Ag Home Test Kit Generic drug: COVID-19 At Home Antigen Test FOLLOW THE test instructions FOR using THE at-home covid test.   levalbuterol 45 MCG/ACT inhaler Commonly known as: XOPENEX HFA Inhale 1-2 puffs into the lungs every 6 (six) hours as needed for wheezing or shortness of breath. prn   metroNIDAZOLE 0.75 % gel Commonly known as: METROGEL Apply 1 application topically 2 (two) times daily.   multivitamin with minerals Tabs tablet Take 1 tablet by mouth daily.   pantoprazole 40 MG tablet Commonly known as: PROTONIX Take 40 mg by mouth every morning.   valACYclovir 1000 MG tablet Commonly known as: VALTREX Take 2 tabs and repeat in 12 hours.  Start with onset of symptoms.    Review of systems negative except as noted in HPI / PMHx or noted below:  Review of Systems  Constitutional: Negative.   HENT: Negative.    Eyes: Negative.   Respiratory: Negative.    Cardiovascular: Negative.   Gastrointestinal: Negative.   Genitourinary: Negative.   Musculoskeletal: Negative.   Skin: Negative.   Neurological: Negative.   Endo/Heme/Allergies: Negative.   Psychiatric/Behavioral: Negative.     Family History  Problem Relation Age of Onset   Hypertension Father    Diabetes Other    Heart disease Other    Cancer Maternal Grandfather        skin cancer   Heart attack Maternal Grandfather    Emphysema Maternal Grandfather    Parkinsonism Paternal Grandmother    Heart attack Paternal Grandfather    Cancer Maternal Grandmother     Social History   Socioeconomic History   Marital status: Single    Spouse name: Not on file   Number of children: 0   Years of education: Not on file   Highest education level:  Not on file  Occupational History   Occupation: school teacher  Tobacco Use   Smoking status: Never   Smokeless tobacco: Never  Vaping Use   Vaping Use: Never used  Substance and Sexual Activity   Alcohol use: No   Drug use: No   Sexual activity: Not Currently    Partners: Male    Birth control/protection: Abstinence  Other Topics Concern   Not on file  Social History Narrative   Not on file   Environmental and Social history  Lives in a house with a dry environment, 2 dogs located inside the household, no carpet in the bedroom, no plastic on the bed, no plastic on the pillow, no smoking ongoing with inside the household.  She is a Pharmacist, hospital.  Objective:   Vitals:   11/10/21 1432  BP: 110/72  Pulse: 83  Resp: 16  Temp: 98.1 F (36.7 C)  SpO2: 99%   Height: '5\' 3"'  (160 cm) Weight: 120 lb (54.4 kg)  Physical Exam Constitutional:      Appearance: She is not diaphoretic.  HENT:     Head: Normocephalic.     Right Ear: Tympanic membrane, ear canal and external ear normal.     Left Ear: Tympanic membrane, ear canal and external ear normal.     Nose: Nose normal. No mucosal edema or rhinorrhea.     Mouth/Throat:     Pharynx: Uvula midline. No oropharyngeal exudate.  Eyes:     Conjunctiva/sclera: Conjunctivae normal.  Neck:     Thyroid: No thyromegaly.     Trachea: Trachea normal. No tracheal tenderness or tracheal deviation.  Cardiovascular:     Rate and Rhythm: Normal rate and regular rhythm.     Heart sounds: Normal heart sounds, S1 normal and S2 normal. No murmur heard. Pulmonary:     Effort: No respiratory distress.     Breath sounds: Normal breath sounds. No stridor. No wheezing or rales.  Lymphadenopathy:     Head:     Right side of head: No tonsillar adenopathy.     Left side of head: No tonsillar adenopathy.     Cervical: No cervical adenopathy.  Skin:    Findings: No erythema or rash.     Nails: There is no clubbing.  Neurological:     Mental Status:  She is alert.    Diagnostics: Allergy skin tests were performed.  She demonstrated hypersensitivity to house dust mite and cat and grass pollen  Spirometry was performed and demonstrated an FEV1 of 2.33 @ 84 % of predicted. FEV1/FVC = 0.84  Assessment and Plan:    1. Perennial allergic rhinitis   2. Seasonal allergic rhinitis due to pollen   3. Asthma, mild intermittent, well-controlled     1.  Allergen avoidance measures - dust mite, cat, pollen  2.  Ryaltris - 2 sprays each nostril 1-2 times per day (specialty pharmacy)  3. If needed:  A. Cetirizine 10 mg - 1-2 tablets 1-2 times per day (MAX=56m/day) B. Pataday - 1 drop each eye 1 time per day C. Albuterol HFA - 2 inhalations every 4-6 hours  4. Immunotherapy???  5. Return to clinic in 4 weeks or earlier if problem  JJaylaaappears to have an atopic immune system and she appears to have developed significant hypersensitivity directed against dust mite over the course of the past year in addition to her previous history of cat and pollen hypersensitivity.  We will attempt to have her use a combination of allergen avoidance measures and a combination nasal spray and see what happens over the course of the next several weeks.  If she fails medical therapy she would definitely be a candidate for immunotherapy.  EJiles Prows MD Allergy / Immunology [facility] [place]

## 2021-11-10 NOTE — Patient Instructions (Addendum)
°  1.  Allergen avoidance measures - dust mite, cat, pollen  2.  Ryaltris - 2 sprays each nostril 1-2 times per day (specialty pharmacy)  3. If needed:  A. Cetirizine 10 mg - 1-2 tablets 1-2 times per day (MAX=40mg /day) B. Pataday - 1 drop each eye 1 time per day C. Albuterol HFA - 2 inhalations every 4-6 hours  4. Immunotherapy???  5. Return to clinic in 4 weeks or earlier if problem

## 2021-11-11 ENCOUNTER — Encounter: Payer: Self-pay | Admitting: Allergy and Immunology

## 2021-12-29 ENCOUNTER — Encounter: Payer: Self-pay | Admitting: Allergy and Immunology

## 2021-12-29 ENCOUNTER — Ambulatory Visit: Payer: BC Managed Care – PPO | Admitting: Allergy and Immunology

## 2021-12-29 ENCOUNTER — Other Ambulatory Visit: Payer: Self-pay

## 2021-12-29 VITALS — BP 112/68 | HR 91 | Temp 98.3°F | Resp 17

## 2021-12-29 DIAGNOSIS — J4520 Mild intermittent asthma, uncomplicated: Secondary | ICD-10-CM

## 2021-12-29 DIAGNOSIS — J3089 Other allergic rhinitis: Secondary | ICD-10-CM | POA: Diagnosis not present

## 2021-12-29 DIAGNOSIS — J301 Allergic rhinitis due to pollen: Secondary | ICD-10-CM

## 2021-12-29 MED ORDER — RYALTRIS 665-25 MCG/ACT NA SUSP: 2.0000 | Freq: Two times a day (BID) | NASAL | 5 refills | Status: DC

## 2021-12-29 NOTE — Progress Notes (Signed)
[facility]   Follow-up Note  Referring Provider: Lorrene Reid, PA-C Primary Provider: Lorrene Reid, PA-C Date of Office Visit: 12/29/2021  Subjective:   Morgan Wood (DOB: 24-Oct-1976) is a 46 y.o. female who returns to the [facility] [place] on 12/29/2021 in re-evaluation of the following:  HPI: Morgan Wood returns to this clinic in evaluation of allergic rhinoconjunctivitis and asthma.  I last saw her in this clinic during her initial evaluation of 10 November 2021.  At this point time she feels as though she is doing quite well.  She has performed allergen avoidance measures against dust mite and she has been using a combination nasal steroid and nasal antihistamine about 1 time per day.  While doing so she feels as though she has very good control of her allergic rhinoconjunctivitis.  She has had no need to use a short acting bronchodilator and can exert herself without any problem.  Allergies as of 12/29/2021       Reactions   Albuterol Other (See Comments)   High Heart Rate Jitters   Diclofenac Other (See Comments)   Caused chest pain Other reaction(s): chest pain   Azithromycin Other (See Comments)   Stomach cramps Other reaction(s): stomach upset        Medication List    cetirizine 10 MG tablet Commonly known as: ZYRTEC Take 1 tablet (10 mg total) by mouth 2 (two) times daily as needed for allergies (Can take an extra dose during flare ups.).   cholecalciferol 25 MCG (1000 UNIT) tablet Commonly known as: VITAMIN D3 Take 1,000 Units by mouth daily.   Flowflex COVID-19 Ag Home Test Kit Generic drug: COVID-19 At Home Antigen Test FOLLOW THE test instructions FOR using THE at-home covid test.   levalbuterol 45 MCG/ACT inhaler Commonly known as: XOPENEX HFA Inhale 1-2 puffs into the lungs every 6 (six) hours as needed for wheezing or shortness of breath. prn   metroNIDAZOLE 0.75 % gel Commonly known  as: METROGEL Apply 1 application topically 2 (two) times daily.   multivitamin with minerals Tabs tablet Take 1 tablet by mouth daily.   pantoprazole 40 MG tablet Commonly known as: PROTONIX Take 40 mg by mouth every morning.   Ryaltris 425-95 MCG/ACT Susp Generic drug: Olopatadine-Mometasone Place 2 sprays into the nose 2 (two) times daily. 2 sprays each nostril 1-2 times per day.   valACYclovir 1000 MG tablet Commonly known as: VALTREX Take 2 tabs and repeat in 12 hours.  Start with onset of symptoms.    Past Medical History:  Diagnosis Date   Allergic rhinitis    Allergy    Asthma    Bronchitis    1-2 episodes yearly   Environmental allergies    rash,swelling in throat,itching   Headache(784.0)    Kidney stone    Laryngitis    2-3episodes yearly    Past Surgical History:  Procedure Laterality Date   bulging disc C3 C4  2006   CYSTOSCOPY/URETEROSCOPY/HOLMIUM LASER/STENT PLACEMENT Right 03/24/2020   Procedure: CYSTOSCOPY/URETEROSCOPY/HOLMIUM LASER/STENT PLACEMENT, RETROGRADE PYLEGRAM.;  Surgeon: Robley Fries, MD;  Location: [facility];  Service: Urology;  Laterality: Right;   EUS  10/13/2012   Procedure: UPPER ENDOSCOPIC ULTRASOUND (EUS) LINEAR;  Surgeon: Beryle Beams, MD;  Location: [facility];  Service: Endoscopy;  Laterality: N/A;   NASAL SEPTUM SURGERY  11/01/2002   UPPER GASTROINTESTINAL ENDOSCOPY  11/13; 12/13   WISDOM TOOTH EXTRACTION      Review  of systems negative except as noted in HPI / PMHx or noted below:  Review of Systems  Constitutional: Negative.   HENT: Negative.    Eyes: Negative.   Respiratory: Negative.    Cardiovascular: Negative.   Gastrointestinal: Negative.   Genitourinary: Negative.   Musculoskeletal: Negative.   Skin: Negative.   Neurological: Negative.   Endo/Heme/Allergies: Negative.   Psychiatric/Behavioral: Negative.      Objective:   Vitals:   12/29/21 1524  BP: 112/68  Pulse: 91  Resp: 17  Temp: 98.3 F (36.8  C)  SpO2: 99%          Physical Exam Constitutional:      Appearance: She is not diaphoretic.  HENT:     Head: Normocephalic.     Right Ear: Tympanic membrane, ear canal and external ear normal.     Left Ear: Tympanic membrane, ear canal and external ear normal.     Nose: Nose normal. No mucosal edema or rhinorrhea.     Mouth/Throat:     Pharynx: Uvula midline. No oropharyngeal exudate.  Eyes:     Conjunctiva/sclera: Conjunctivae normal.  Neck:     Thyroid: No thyromegaly.     Trachea: Trachea normal. No tracheal tenderness or tracheal deviation.  Cardiovascular:     Rate and Rhythm: Normal rate and regular rhythm.     Heart sounds: Normal heart sounds, S1 normal and S2 normal. No murmur heard. Pulmonary:     Effort: No respiratory distress.     Breath sounds: Normal breath sounds. No stridor. No wheezing or rales.  Lymphadenopathy:     Head:     Right side of head: No tonsillar adenopathy.     Left side of head: No tonsillar adenopathy.     Cervical: No cervical adenopathy.  Skin:    Findings: No erythema or rash.     Nails: There is no clubbing.  Neurological:     Mental Status: She is alert.  2  Diagnostics:    Spirometry was performed and demonstrated an FEV1 of 2.18 at 78 % of predicted.  The patient had an Asthma Control Test with the following results: ACT Total Score: 23.    Assessment and Plan:   1. Perennial allergic rhinitis   2. Seasonal allergic rhinitis due to pollen   3. Asthma, mild intermittent, well-controlled     1.  Allergen avoidance measures - dust mite, cat, pollen  2.  Ryaltris - 2 sprays each nostril 1-2 times per day (specialty pharmacy)  3. If needed:  A. Cetirizine 10 mg - 1-2 tablets 1-2 times per day (MAX=1m/day) B. Pataday - 1 drop each eye 1 time per day C. Albuterol HFA - 2 inhalations every 4-6 hours  4. Immunotherapy???  5. Return to clinic in 1 year or earlier if problem  JTaekois doing very well at this point in  time with attention to allergen avoidance measures and the use of the nasal antihistamine/steroid and she can continue to utilize the plan noted above and if she does well then I will see her back in this clinic in 1 year or earlier if there is a problem.  Should she fail medical therapy she can certainly consider starting a course of immunotherapy.  EAllena Katz MD Allergy / Immunology [place]

## 2021-12-29 NOTE — Patient Instructions (Addendum)
°  1.  Allergen avoidance measures - dust mite, cat, pollen  2.  Ryaltris - 2 sprays each nostril 1-2 times per day    3. If needed:  A. Cetirizine 10 mg - 1-2 tablets 1-2 times per day (MAX=40mg /day) B. Pataday - 1 drop each eye 1 time per day C. Albuterol HFA - 2 inhalations every 4-6 hours  4. Immunotherapy???  5. Return to clinic in 1 year or earlier if problem

## 2021-12-30 ENCOUNTER — Encounter: Payer: Self-pay | Admitting: Allergy and Immunology

## 2022-01-01 NOTE — Progress Notes (Unsigned)
Patient ID: GLEN JOW, female    DOB: 1976-09-22, 45 y.o.   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 OTC not helping and saline rinse not helping enough. Notes she did better before stopping allergy shots around 2017 and we discussed referral to allergist.  Having some heart burn when not taking Prilosec.   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 Follow-up CT recommended in 3-6 months, then consider CT at 18-24 months, per Fleischner criteria.     ROS-see HPI + = positive Constitutional:   No-   weight loss, night sweats, +fevers, chills, fatigue, lassitude. HEENT:   + headaches, difficulty swallowing, tooth/dental problems, +sore throat,       No-  sneezing, itching, ear ache, +nasal congestion, +post nasal drip,  CV:  No-   chest pain, orthopnea, PND, swelling in lower extremities, anasarca, dizziness, palpitations Resp: No-   shortness of breath with exertion or at rest.              +productive cough,  +  non-productive cough,  No- coughing up of blood.              No-   change in color of mucus.  No- wheezing.   Skin: No-   rash or lesions. GI:  No-   heartburn, indigestion, abdominal pain, nausea, vomiting,  GU: . MS:  No-   joint pain or swelling.   Neuro-     nothing unusual Psych:  No- change in mood or affect. No depression or anxiety.  No memory loss.  Objective:   Physical Exam amb wf nad General- Alert, Oriented, Affect-appropriate, Distress- none acute, slender Skin- rash-none, lesions- none, excoriation- none Lymphadenopathy- none Head- atraumatic            Eyes- Gross vision intact, PERRLA, conjunctivae clear secretions            Ears- Hearing, canals-normal, TMs clear            Nose-  turbinate edema, no-Septal dev, polyps, erosion, perforation             Throat- Mallampati II , mucosa clear , drainage- none, tonsils- present Neck- flexible , trachea midline, no stridor , thyroid nl, carotid no bruit Chest - symmetrical excursion , unlabored           Heart/CV- RRR , no murmur , no gallop  , no rub, nl s1 s2                            JVD- trace/ fills from above , edema- none, stasis changes- none, varices- none           Lung- clear  to P&A, wheeze- none, cough- none , dullness-none, rub- none           Chest wall-  Abd-  Soft, no tenderness, with particular attention to left upper quadrant Extrem- cyanosis- none, clubbing, none, atrophy- none, strength- nl

## 2022-01-04 ENCOUNTER — Ambulatory Visit: Payer: BC Managed Care – PPO | Admitting: Internal Medicine

## 2022-01-04 ENCOUNTER — Encounter: Payer: Self-pay | Admitting: Internal Medicine

## 2022-01-04 ENCOUNTER — Other Ambulatory Visit: Payer: Self-pay

## 2022-01-04 VITALS — BP 118/70 | HR 75 | Temp 97.7°F | Ht 63.0 in | Wt 121.2 lb

## 2022-01-04 DIAGNOSIS — R911 Solitary pulmonary nodule: Secondary | ICD-10-CM | POA: Diagnosis not present

## 2022-01-04 DIAGNOSIS — J4520 Mild intermittent asthma, uncomplicated: Secondary | ICD-10-CM

## 2022-01-04 NOTE — Patient Instructions (Signed)
Order- schedule CT chest no contrast  future- 6 months    dx lung nodules ? ?Please call if we can help ?

## 2022-01-22 LAB — HM COLONOSCOPY

## 2022-01-26 ENCOUNTER — Encounter: Payer: Self-pay | Admitting: Internal Medicine

## 2022-01-26 NOTE — Assessment & Plan Note (Signed)
These are probably postinflammatory and benign.  We will follow-up radiology recommendation. ?Plan-CT chest in 6 months for evaluation of nodules. ?

## 2022-01-26 NOTE — Assessment & Plan Note (Signed)
Mild intermittent uncomplicated.  Followed by her allergist and using Xopenex rescue inhaler as instructed. ?Plan-no changes needed. ?

## 2022-01-29 ENCOUNTER — Encounter: Payer: Self-pay | Admitting: Physician Assistant

## 2022-05-17 ENCOUNTER — Encounter: Payer: Self-pay | Admitting: Internal Medicine

## 2022-06-23 ENCOUNTER — Other Ambulatory Visit: Payer: Self-pay | Admitting: Obstetrics & Gynecology

## 2022-06-23 DIAGNOSIS — Z1231 Encounter for screening mammogram for malignant neoplasm of breast: Secondary | ICD-10-CM

## 2022-07-08 ENCOUNTER — Other Ambulatory Visit (HOSPITAL_BASED_OUTPATIENT_CLINIC_OR_DEPARTMENT_OTHER): Payer: BC Managed Care – PPO

## 2022-07-08 ENCOUNTER — Ambulatory Visit (INDEPENDENT_AMBULATORY_CARE_PROVIDER_SITE_OTHER): Payer: BC Managed Care – PPO | Admitting: Obstetrics & Gynecology

## 2022-07-08 ENCOUNTER — Ambulatory Visit (HOSPITAL_BASED_OUTPATIENT_CLINIC_OR_DEPARTMENT_OTHER)
Admission: RE | Admit: 2022-07-08 | Discharge: 2022-07-08 | Disposition: A | Discharge status: Home / Self Care | Payer: BC Managed Care – PPO | Source: Ambulatory Visit | Attending: Internal Medicine | Admitting: Internal Medicine

## 2022-07-08 ENCOUNTER — Encounter (HOSPITAL_BASED_OUTPATIENT_CLINIC_OR_DEPARTMENT_OTHER): Payer: Self-pay | Admitting: Obstetrics & Gynecology

## 2022-07-08 VITALS — BP 128/90 | HR 84 | Ht 63.0 in | Wt 123.4 lb

## 2022-07-08 DIAGNOSIS — Z01411 Encounter for gynecological examination (general) (routine) with abnormal findings: Secondary | ICD-10-CM

## 2022-07-08 DIAGNOSIS — K069 Disorder of gingiva and edentulous alveolar ridge, unspecified: Secondary | ICD-10-CM | POA: Diagnosis not present

## 2022-07-08 DIAGNOSIS — Z01419 Encounter for gynecological examination (general) (routine) without abnormal findings: Secondary | ICD-10-CM | POA: Diagnosis not present

## 2022-07-08 DIAGNOSIS — B009 Herpesviral infection, unspecified: Secondary | ICD-10-CM | POA: Diagnosis not present

## 2022-07-08 DIAGNOSIS — R911 Solitary pulmonary nodule: Secondary | ICD-10-CM | POA: Diagnosis not present

## 2022-07-08 DIAGNOSIS — Z0000 Encounter for general adult medical examination without abnormal findings: Secondary | ICD-10-CM | POA: Diagnosis not present

## 2022-07-08 MED ORDER — VALACYCLOVIR HCL 1 G PO TABS: ORAL_TABLET | ORAL | 5 refills | Status: DC

## 2022-07-08 NOTE — Progress Notes (Unsigned)
46 y.o. G0P0000 Single White or Caucasian female here for annual exam.  Mother was diagnosed with breast cancer.  Reports the tumor was larger but pt does not know her mother's stage.  Had lumpectomy, chemo, radiation and will be on adjuvant therapy.  Genetic testing was not recommended.  Tyrer Cusick model calculated today.  Lifetime risk is 26.6% for breast cancer.  She is still having menstrual cycles.  They are still regular.    Patient's last menstrual period was 07/01/2022.          Sexually active: No.  The current method of family planning is abstinence.    Exercising: "not as much a I probably should" Smoker:  no  Health Maintenance: Pap:  04/21/2020 Negative History of abnormal Pap:  yes in 2014 MMG:  07/23/2021 Negative Colonoscopy:  01/22/2022 Screening Labs: today   reports that she has never smoked. She has never used smokeless tobacco. She reports that she does not drink alcohol and does not use drugs.  Past Medical History:  Diagnosis Date   Allergic rhinitis    Allergy    Asthma    Bronchitis    1-2 episodes yearly   Environmental allergies    rash,swelling in throat,itching   Headache(784.0)    Kidney stone    Laryngitis    2-3episodes yearly    Past Surgical History:  Procedure Laterality Date   bulging disc C3 C4  2006   CYSTOSCOPY/URETEROSCOPY/HOLMIUM LASER/STENT PLACEMENT Right 03/24/2020   Procedure: CYSTOSCOPY/URETEROSCOPY/HOLMIUM LASER/STENT PLACEMENT, RETROGRADE PYLEGRAM.;  Surgeon: Noel Christmas, MD;  Location: [facility];  Service: Urology;  Laterality: Right;   EUS  10/13/2012   Procedure: UPPER ENDOSCOPIC ULTRASOUND (EUS) LINEAR;  Surgeon: Theda Belfast, MD;  Location: [facility];  Service: Endoscopy;  Laterality: N/A;   NASAL SEPTUM SURGERY  11/01/2002   UPPER GASTROINTESTINAL ENDOSCOPY  11/13; 12/13   WISDOM TOOTH EXTRACTION      Current Outpatient Medications  Medication Sig Dispense Refill   cetirizine (ZYRTEC) 10 MG tablet Take 1  tablet (10 mg total) by mouth 2 (two) times daily as needed for allergies (Can take an extra dose during flare ups.). 60 tablet 5   cholecalciferol (VITAMIN D3) 25 MCG (1000 UNIT) tablet Take 1,000 Units by mouth daily.     levalbuterol (XOPENEX HFA) 45 MCG/ACT inhaler Inhale 1-2 puffs into the lungs every 6 (six) hours as needed for wheezing or shortness of breath. prn 1 each 12   metroNIDAZOLE (METROGEL) 0.75 % gel Apply 1 application topically 2 (two) times daily.     Multiple Vitamin (MULTIVITAMIN WITH MINERALS) TABS tablet Take 1 tablet by mouth daily.     valACYclovir (VALTREX) 1000 MG tablet Take 2 tabs and repeat in 12 hours.  Start with onset of symptoms. 30 tablet 2   No current facility-administered medications for this visit.    Family History  Problem Relation Age of Onset   Breast cancer Mother    Hypertension Father    Cancer Maternal Grandmother    Cancer Maternal Grandfather        skin cancer   Heart attack Maternal Grandfather    Emphysema Maternal Grandfather    Parkinsonism Paternal Grandmother    Heart attack Paternal Grandfather    Diabetes Other    Heart disease Other     ROS: Constitutional: negative Genitourinary:negative  Exam:   BP (!) 128/90 (BP Location: Right Arm, Patient Position: Sitting, Cuff Size: Normal)   Pulse 84   Ht 5'  3" (1.6 m) Comment: reported  Wt 123 lb 6.4 oz (56 kg)   LMP 07/01/2022   BMI 21.86 kg/m   Height: 5\' 3"  (160 cm) (reported)  General appearance: alert, cooperative and appears stated age Head: Normocephalic, without obvious abnormality, atraumatic Neck: no adenopathy, supple, symmetrical, trachea midline and thyroid normal to inspection and palpation Lungs: clear to auscultation bilaterally Breasts: normal appearance, no masses or tenderness Heart: regular rate and rhythm Abdomen: soft, non-tender; bowel sounds normal; no masses,  no organomegaly Extremities: extremities normal, atraumatic, no cyanosis or  edema Skin: Skin color, texture, turgor normal. No rashes or lesions Lymph nodes: Cervical, supraclavicular, and axillary nodes normal. No abnormal inguinal nodes palpated Neurologic: Grossly normal   Pelvic: External genitalia:  no lesions              Urethra:  normal appearing urethra with no masses, tenderness or lesions              Bartholins and Skenes: normal                 Vagina: normal appearing vagina with normal color and no discharge, no lesions              Cervix: no lesions              Pap taken: No. Bimanual Exam:  Uterus:  normal size, contour, position, consistency, mobility, non-tender              Adnexa: normal adnexa and no mass, fullness, tenderness               Rectovaginal: Confirms               Anus:  normal sphincter tone, no lesions  Chaperone, , CMA, was present for exam.  Assessment/Plan: 1. Well woman exam with routine gynecological exam - Pap 04/21/2020.  Will repeat next year.   - Mammogram 07/23/2021 - Colonoscopy 01/22/2022 - lab work ordered today - vaccines reviewed/updated  2. Blood tests for routine general physical examination - Comprehensive metabolic panel - Lipid panel  3. 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  Start with onset of symptoms.  Dispense: 30 tablet; Refill: 5

## 2022-07-09 LAB — COMPREHENSIVE METABOLIC PANEL
ALT: 21 IU/L (ref 0–32)
AST: 22 IU/L (ref 0–40)
Albumin/Globulin Ratio: 1.8 (ref 1.2–2.2)
Albumin: 4.6 g/dL (ref 3.9–4.9)
Alkaline Phosphatase: 52 IU/L (ref 44–121)
BUN/Creatinine Ratio: 26 — ABNORMAL HIGH (ref 9–23)
BUN: 22 mg/dL (ref 6–24)
Bilirubin Total: 0.3 mg/dL (ref 0.0–1.2)
CO2: 24 mmol/L (ref 20–29)
Calcium: 9.7 mg/dL (ref 8.7–10.2)
Chloride: 100 mmol/L (ref 96–106)
Creatinine, Ser: 0.84 mg/dL (ref 0.57–1.00)
Globulin, Total: 2.6 g/dL (ref 1.5–4.5)
Glucose: 85 mg/dL (ref 70–99)
Potassium: 4.3 mmol/L (ref 3.5–5.2)
Sodium: 139 mmol/L (ref 134–144)
Total Protein: 7.2 g/dL (ref 6.0–8.5)
eGFR: 86 mL/min/{1.73_m2} (ref >59)

## 2022-07-09 LAB — LIPID PANEL
Chol/HDL Ratio: 3.1 ratio (ref 0.0–4.4)
Cholesterol, Total: 179 mg/dL (ref 100–199)
HDL: 58 mg/dL (ref >39)
LDL Chol Calc (NIH): 106 mg/dL — ABNORMAL HIGH (ref 0–99)
Triglycerides: 79 mg/dL (ref 0–149)
VLDL Cholesterol Cal: 15 mg/dL (ref 5–40)

## 2022-07-11 NOTE — Progress Notes (Unsigned)
Patient ID: Morgan Wood, female    DOB: 11/22/1975, 46 y.o.   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  Not significantly impacted by any recent infections or early spring pollen.  Using rescue inhaler 2 or 3 times per week. Asks about status of small lung nodules- reviewed CT. CT chest 11/03/21- IMPRESSION: A few pulmonary nodules identified measuring up to 7 mm in the right lower lobe. Follow-up CT recommended in 3-6 months, then consider CT at 18-24 months, per Fleischner criteria.  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 CT report reviewed and management of nodules discussed. Probably benign. She is comfortable with suggestion to f/u next time with CXR, which would alert [place] if anything is growing- to spare cost and radiation. Incidental "dry skin" on forehead and with some pruritus and excoriation on R 4th finger suggestive of eczema. She will get flu vax from PCP. CT chest 07/08/22-  IMPRESSION: Stable appearance of small bilateral  pulmonary nodules measuring up to 6 mm. If patient is high risk for malignancy, recommend an additional non-contrast Chest CT at 18-24 months (from 11/03/2021); if patient is low risk for malignancy a non-contrast Chest CT at 18-24 months is optional. These guidelines do not apply to immunocompromised patients and patients with cancer. Follow up in patients with significant comorbidities as clinically warranted. For lung cancer screening, adhere to Lung-RADS guidelines. Reference: Radiology. 2017; 284(1):228-43.    ROS-see HPI + = positive Constitutional:   No-   weight loss, night sweats, +fevers, chills, fatigue, lassitude. HEENT:   + headaches, difficulty swallowing, tooth/dental problems, +sore throat,       No-  sneezing, itching, ear ache, +nasal congestion, +post nasal drip,  CV:  No-   chest pain, orthopnea, PND, swelling in lower extremities, anasarca, dizziness, palpitations Resp: No-   shortness of breath with exertion or at rest.              productive cough,  + non-productive cough,  No- coughing up of blood.              No-   change in color of mucus.  No- wheezing.   Skin: No-   rash or lesions. GI:  No-   heartburn, indigestion, abdominal pain, nausea, vomiting,  GU: . MS:  No-   joint pain or swelling.   Neuro-     nothing unusual Psych:  No- change in mood or affect. No depression or anxiety.  No memory loss.  Objective:   Physical Exam amb wf nad General- Alert, Oriented, Affect-appropriate, Distress- none acute, slender Skin- rash-none, lesions- none, excoriation- none Lymphadenopathy- none Head- atraumatic            Eyes- Gross vision intact, PERRLA, conjunctivae clear secretions  Ears- Hearing, canals-normal, TMs clear            Nose-  turbinate edema, no-Septal dev, polyps, erosion, perforation             Throat- Mallampati II , mucosa clear , drainage- none, tonsils- present Neck- flexible , trachea midline, no stridor , thyroid nl, carotid  no bruit Chest - symmetrical excursion , unlabored           Heart/CV- RRR , no murmur , no gallop  , no rub, nl s1 s2                            JVD- trace/ fills from above , edema- none, stasis changes- none, varices- none           Lung- clear to P&A, wheeze- none, cough- none , dullness-none, rub- none           Chest wall-  Abd-  Soft, no tenderness, with particular attention to left upper quadrant Extrem- cyanosis- none, clubbing, none, atrophy- none, strength- nl

## 2022-07-12 ENCOUNTER — Ambulatory Visit: Payer: BC Managed Care – PPO | Admitting: Internal Medicine

## 2022-07-12 ENCOUNTER — Encounter: Payer: Self-pay | Admitting: Internal Medicine

## 2022-07-12 DIAGNOSIS — R911 Solitary pulmonary nodule: Secondary | ICD-10-CM | POA: Diagnosis not present

## 2022-07-12 DIAGNOSIS — J3089 Other allergic rhinitis: Secondary | ICD-10-CM | POA: Diagnosis not present

## 2022-07-12 NOTE — Assessment & Plan Note (Addendum)
Managed by her Allergist Note possible eczema.

## 2022-07-12 NOTE — Patient Instructions (Signed)
We discussed looking again at your lung nodules, probably with a chest xray, in about 18-24 months.  Please call if we can help

## 2022-07-12 NOTE — Assessment & Plan Note (Signed)
Multiple small nodules are stable or shrinking in this non-smoker, by CT. Plan- CXR at next visit in 18 months or sooner if needed.

## 2022-07-14 ENCOUNTER — Encounter (HOSPITAL_BASED_OUTPATIENT_CLINIC_OR_DEPARTMENT_OTHER): Payer: Self-pay | Admitting: Obstetrics & Gynecology

## 2022-07-26 ENCOUNTER — Ambulatory Visit
Admission: RE | Admit: 2022-07-26 | Discharge: 2022-07-26 | Disposition: A | Discharge status: Home / Self Care | Payer: BC Managed Care – PPO | Source: Ambulatory Visit | Attending: Obstetrics & Gynecology | Admitting: Obstetrics & Gynecology

## 2022-07-26 DIAGNOSIS — Z1231 Encounter for screening mammogram for malignant neoplasm of breast: Secondary | ICD-10-CM

## 2023-05-17 ENCOUNTER — Other Ambulatory Visit: Payer: Self-pay

## 2023-05-17 DIAGNOSIS — Z130 Encounter for screening for diseases of the blood and blood-forming organs and certain disorders involving the immune mechanism: Secondary | ICD-10-CM

## 2023-05-17 DIAGNOSIS — Z0000 Encounter for general adult medical examination without abnormal findings: Secondary | ICD-10-CM

## 2023-05-24 ENCOUNTER — Other Ambulatory Visit: Payer: BC Managed Care – PPO

## 2023-05-24 DIAGNOSIS — Z0000 Encounter for general adult medical examination without abnormal findings: Secondary | ICD-10-CM

## 2023-05-24 DIAGNOSIS — Z130 Encounter for screening for diseases of the blood and blood-forming organs and certain disorders involving the immune mechanism: Secondary | ICD-10-CM

## 2023-05-25 LAB — COMPREHENSIVE METABOLIC PANEL
ALT: 15 IU/L (ref 0–32)
AST: 18 IU/L (ref 0–40)
Albumin: 4.4 g/dL (ref 3.9–4.9)
Alkaline Phosphatase: 49 IU/L (ref 44–121)
BUN/Creatinine Ratio: 16 (ref 9–23)
BUN: 12 mg/dL (ref 6–24)
Bilirubin Total: 0.4 mg/dL (ref 0.0–1.2)
CO2: 22 mmol/L (ref 20–29)
Calcium: 9.4 mg/dL (ref 8.7–10.2)
Chloride: 105 mmol/L (ref 96–106)
Creatinine, Ser: 0.74 mg/dL (ref 0.57–1.00)
Globulin, Total: 2.5 g/dL (ref 1.5–4.5)
Glucose: 89 mg/dL (ref 70–99)
Potassium: 4.4 mmol/L (ref 3.5–5.2)
Sodium: 138 mmol/L (ref 134–144)
Total Protein: 6.9 g/dL (ref 6.0–8.5)
eGFR: 100 mL/min/{1.73_m2} (ref >59)

## 2023-05-25 LAB — CBC WITH DIFFERENTIAL/PLATELET
Basophils Absolute: 0.1 10*3/uL (ref 0.0–0.2)
Basos: 1 %
EOS (ABSOLUTE): 0.1 10*3/uL (ref 0.0–0.4)
Eos: 2 %
Hematocrit: 40.7 % (ref 34.0–46.6)
Hemoglobin: 13.4 g/dL (ref 11.1–15.9)
Immature Grans (Abs): 0 10*3/uL (ref 0.0–0.1)
Immature Granulocytes: 0 %
Lymphocytes Absolute: 1.8 10*3/uL (ref 0.7–3.1)
Lymphs: 27 %
MCH: 29.6 pg (ref 26.6–33.0)
MCHC: 32.9 g/dL (ref 31.5–35.7)
MCV: 90 fL (ref 79–97)
Monocytes Absolute: 0.8 10*3/uL (ref 0.1–0.9)
Monocytes: 12 %
Neutrophils Absolute: 3.9 10*3/uL (ref 1.4–7.0)
Neutrophils: 58 %
Platelets: 247 10*3/uL (ref 150–450)
RBC: 4.53 x10E6/uL (ref 3.77–5.28)
RDW: 12.8 % (ref 11.7–15.4)
WBC: 6.6 10*3/uL (ref 3.4–10.8)

## 2023-05-25 LAB — LIPID PANEL
Chol/HDL Ratio: 3 ratio (ref 0.0–4.4)
Cholesterol, Total: 163 mg/dL (ref 100–199)
HDL: 55 mg/dL (ref >39)
LDL Chol Calc (NIH): 89 mg/dL (ref 0–99)
Triglycerides: 108 mg/dL (ref 0–149)
VLDL Cholesterol Cal: 19 mg/dL (ref 5–40)

## 2023-05-25 LAB — HEMOGLOBIN A1C
Est. average glucose Bld gHb Est-mCnc: 111 mg/dL
Hgb A1c MFr Bld: 5.5 % (ref 4.8–5.6)

## 2023-05-25 LAB — TSH: TSH: 2.32 u[IU]/mL (ref 0.450–4.500)

## 2023-06-01 ENCOUNTER — Encounter: Payer: Self-pay | Admitting: Family Medicine

## 2023-06-01 ENCOUNTER — Ambulatory Visit (INDEPENDENT_AMBULATORY_CARE_PROVIDER_SITE_OTHER): Payer: BC Managed Care – PPO | Admitting: Family Medicine

## 2023-06-01 VITALS — BP 131/84 | HR 71 | Resp 18 | Ht 63.0 in | Wt 129.0 lb

## 2023-06-01 DIAGNOSIS — M25561 Pain in right knee: Secondary | ICD-10-CM | POA: Insufficient documentation

## 2023-06-01 NOTE — Progress Notes (Unsigned)
   Established Patient Office Visit  Subjective   Patient ID: Morgan Wood, female    DOB: Jul 10, 1976  Age: 47 y.o. MRN: [id number]  No chief complaint on file.   HPI  Patient's only concern currently is right knee pain that occurred while playing softball in June.  She started having right medial knee pain.  No injury occurred just as far she can remember, to started hurting while she was being softball.  Thought about going to an urgent care for the but pain eventually improved.  In the last few days she has had some aching in that knee but the aching is now above the knee.  She uses ice, rest, topical therapies.  No swelling.  Does not catch or give.  Patient states she had a Pap smear last year at [facility] with Dr. Hyacinth Meeker.  Patient is up-to-date on all her health maintenance issues.  We discussed her laboratory test which were all normal.  Patient is a Tourist information centre manager.  She is single, has no children.   The 10-year ASCVD risk score (Arnett DK, et al., 2019) is: 0.7%   {History (Optional):23778}  ROS    Objective:     There were no vitals taken for this visit. {Vitals History (Optional):23777}  Physical Exam General: Alert, oriented HEENT: PERRLA, moist mucosa CV: Regular rate and rhythm Pulmonary: Lungs clear bilaterally GI: Soft, nontender MSK: No swelling of the right knee.  Nontender to palpation.  No deformity.  Normal range of motion.  Negative anterior and posterior drawer test   No results found for any visits on 06/01/23.  {Labs (Optional):23779}      Assessment & Plan:   There are no diagnoses linked to this encounter.   No follow-ups on file.    Sandre Kitty, MD

## 2023-06-01 NOTE — Assessment & Plan Note (Signed)
Pain occurred at the medial tibial plateau.  Nontender currently.  No abnormalities on exam.  Possibly Pez anserine bursitis.  Recommended conservative management.

## 2023-06-01 NOTE — Patient Instructions (Signed)
It was nice to see you today,  We addressed the following topics today: - your labs look great.   - your knee pain may be caused by bursitis.  This will improve with time.  Continue treating with ice, rest, and topical therapy -f/u in one year   Have a great day,  Frederic Jericho, MD

## 2023-07-11 IMAGING — CT CT CHEST W/O CM
2 of 4 series · 15 of 36 positions shown, 18 images · non-contrast
Comparison: CT abdomen and pelvis 12/01/2020

CLINICAL DATA: Pulmonary nodule follow-up

EXAM:
CT CHEST WITHOUT CONTRAST
TECHNIQUE: Multidetector CT imaging of the chest was performed following the
standard protocol without IV contrast.

[Series 2: thorax · axial · 0.64mm/px · z∈[-293,-31]mm · 12 of 155 slices shown, 15 images]
[im 12/155  mediastinal]
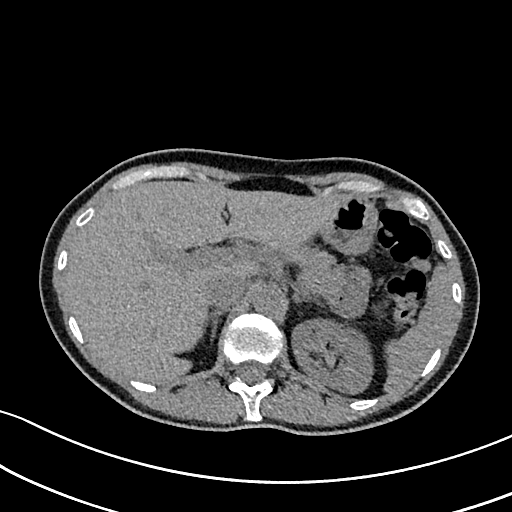
[im 12/155  lung]
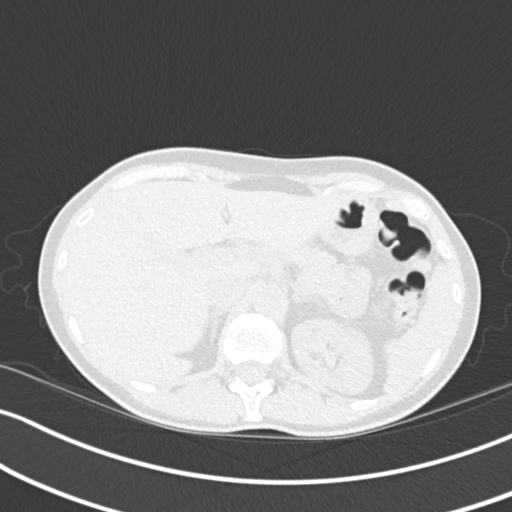
[im 24/155  lung]
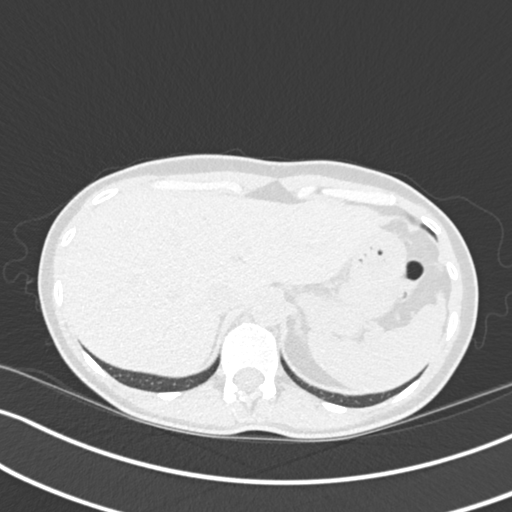
[im 36/155  lung]
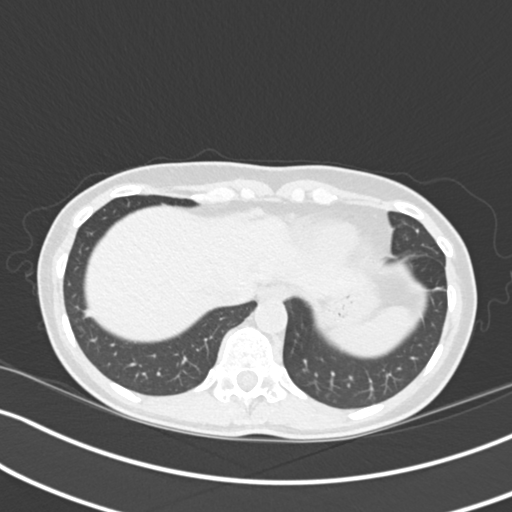
[im 48/155  lung]
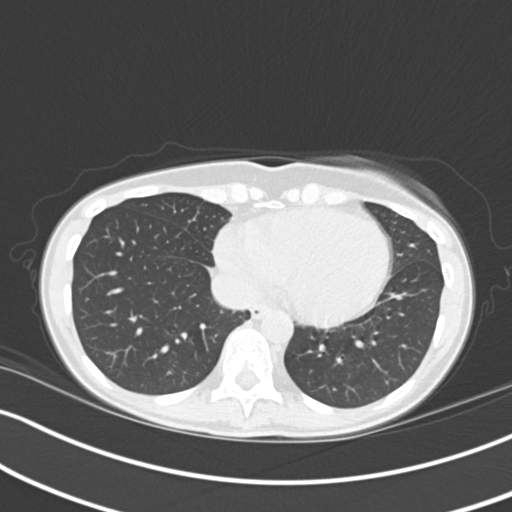
[im 60/155  mediastinal]
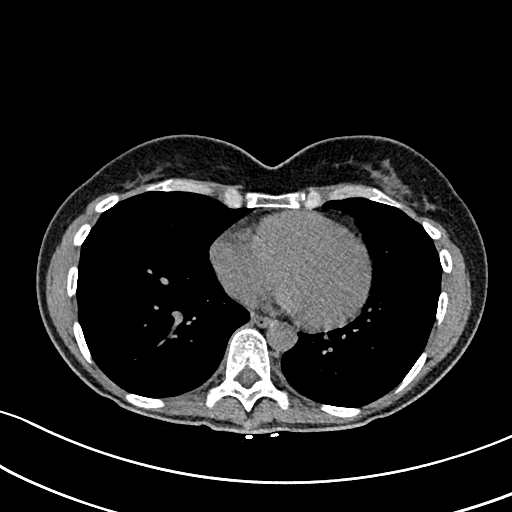
[im 60/155  lung]
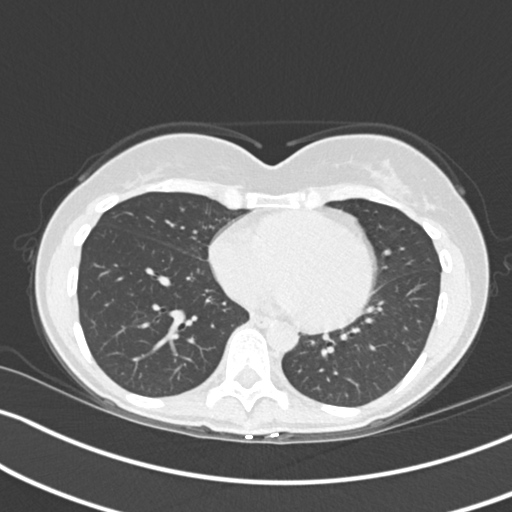
[im 72/155  lung]
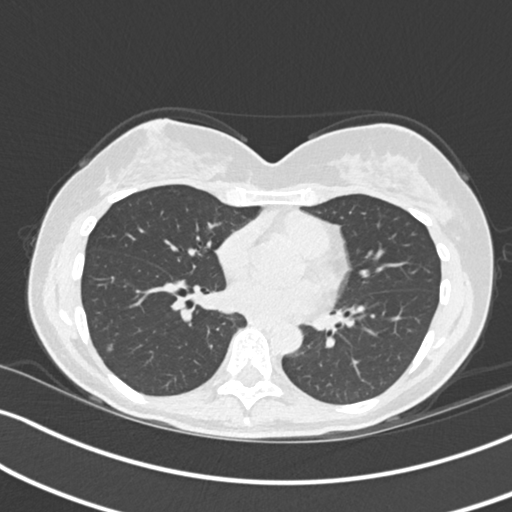
[im 83/155  lung]
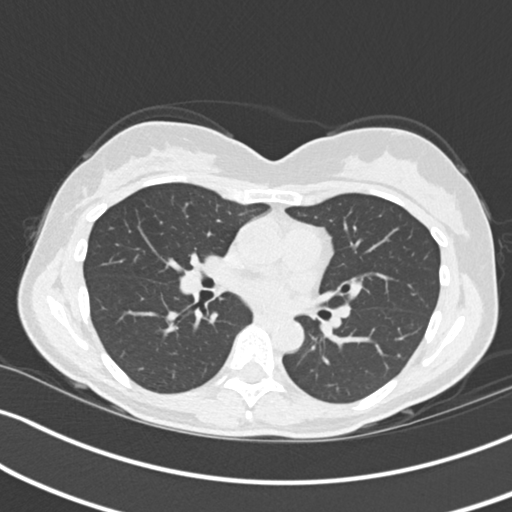
[im 95/155  lung]
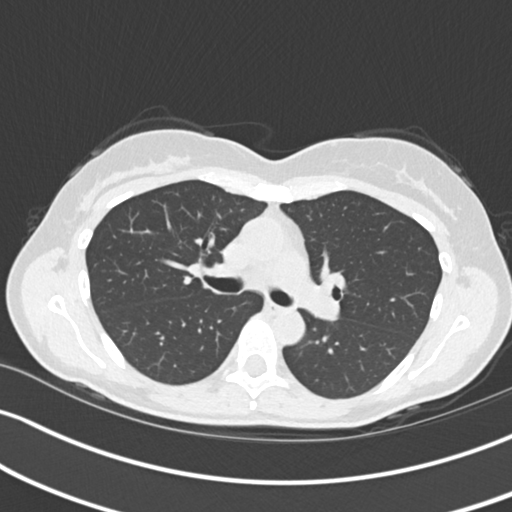
[im 107/155  mediastinal]
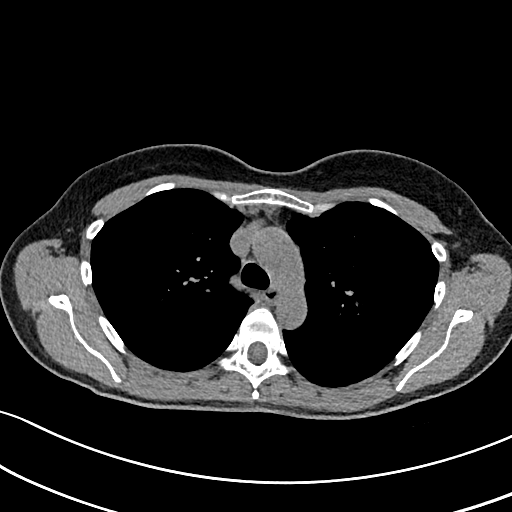
[im 107/155  lung]
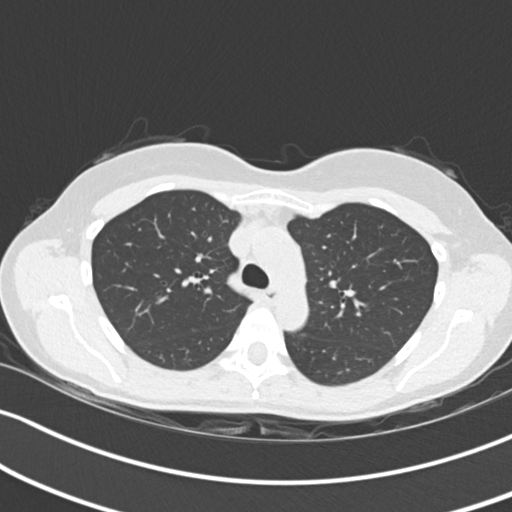
[im 119/155  lung]
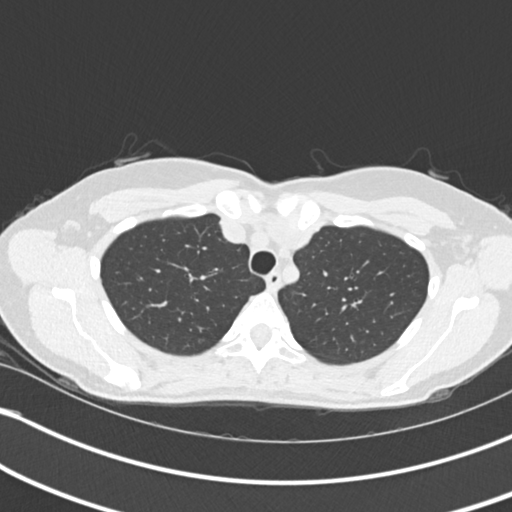
[im 131/155  lung]
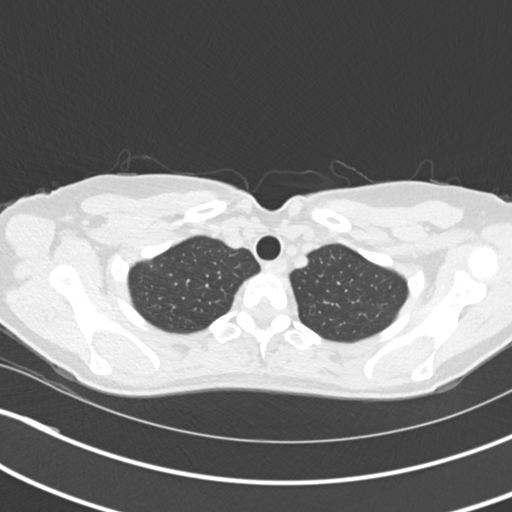
[im 143/155  lung]
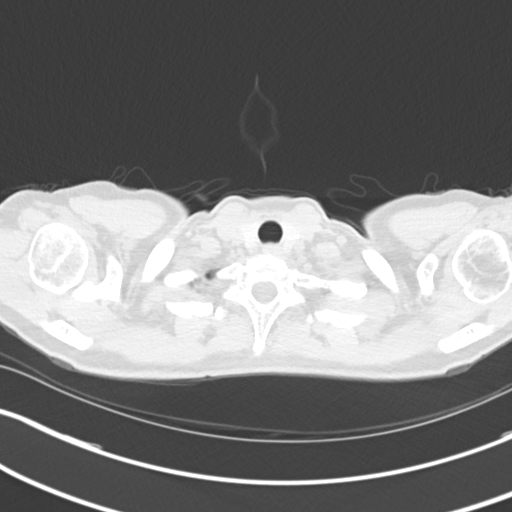

[Series 5: coronal · coronal · 0.57mm/px · 3 of 87 slices shown]
[im 18/87  lung]
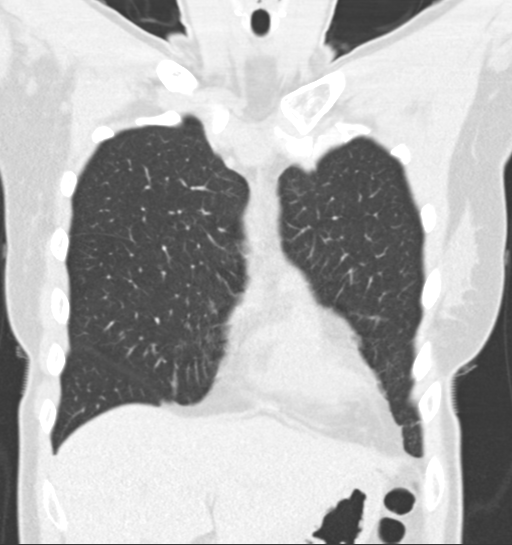
[im 35/87  lung]
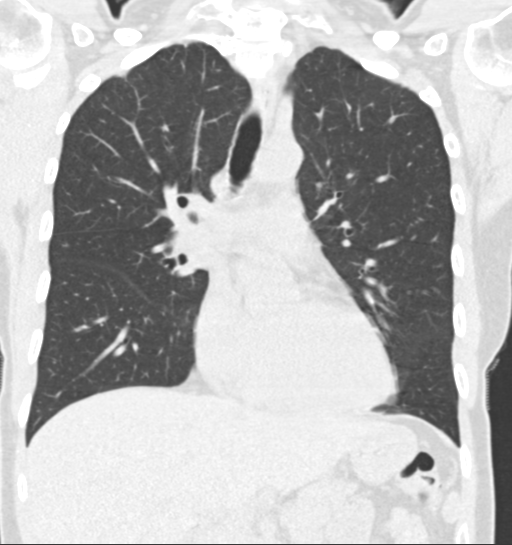
[im 52/87  lung]
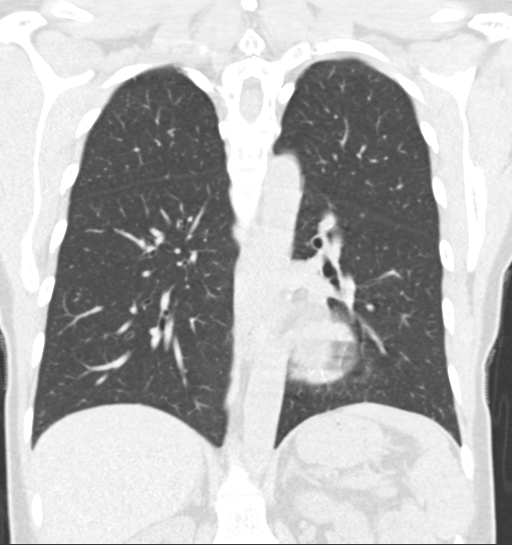

[15 of 36 positions shown; findings below may reference images not displayed]

FINDINGS: Cardiovascular: Heart size is normal. No pericardial effusion
identified. Main pulmonary artery is normal caliber. Thoracic aorta
is normal in course and caliber.

Mediastinum/Nodes: No enlarged mediastinal or axillary lymph nodes.
Thyroid gland, trachea, and esophagus demonstrate no significant
findings.

Lungs/Pleura: A few pulmonary nodules are identified including: 3 mm
nodule in the left upper lobe near the apex image 24, pleural-based
perifissural nodule in the right upper lobe measuring 3 mm image 54,
and a 7 mm pulmonary nodule in the right lower lobe image 83. The 7
mm nodule was only partially visualized on previous abdominal CT and
can not be adequately compared. No focal consolidations identified.
No pleural effusion or pneumothorax.

Upper Abdomen: No acute abnormality.

Musculoskeletal: No chest wall mass or suspicious bone lesions
identified.
IMPRESSION: A few pulmonary nodules identified measuring up to 7 mm in the right
lower lobe. Follow-up CT recommended in 3-6 months, then consider CT
at 18-24 months, per Fleischner criteria.

## 2023-07-15 ENCOUNTER — Telehealth: Payer: Self-pay | Admitting: Internal Medicine

## 2023-07-15 NOTE — Telephone Encounter (Signed)
Dr Maple Hudson, can you clarify when patient needs to follow-up with you?  She is under the impression that she'd be seeing you again in a year from her last visit on 07/12/22, but your notes dictate 18-24 month followup which put [place] into March. She is not having any issues, but thought the CT was supposed to be done in the 18-24 month time frame-not the office visit.

## 2023-07-15 NOTE — Telephone Encounter (Signed)
Patient scheduled for 11/5 with CY. Nothing further needed.

## 2023-07-15 NOTE — Telephone Encounter (Signed)
Suggest I see her for routine office follow-up about a year after her last visit- whenever we can schedule for routine. I can talk with her then about scheduling follow-up imaging we had talked about for 18-24 months after last CT.

## 2023-07-21 ENCOUNTER — Other Ambulatory Visit: Payer: Self-pay | Admitting: Obstetrics & Gynecology

## 2023-07-21 DIAGNOSIS — Z1231 Encounter for screening mammogram for malignant neoplasm of breast: Secondary | ICD-10-CM

## 2023-07-28 ENCOUNTER — Ambulatory Visit (HOSPITAL_BASED_OUTPATIENT_CLINIC_OR_DEPARTMENT_OTHER): Payer: BC Managed Care – PPO | Admitting: Obstetrics & Gynecology

## 2023-07-28 ENCOUNTER — Encounter (HOSPITAL_BASED_OUTPATIENT_CLINIC_OR_DEPARTMENT_OTHER): Payer: Self-pay | Admitting: Obstetrics & Gynecology

## 2023-07-28 ENCOUNTER — Other Ambulatory Visit (HOSPITAL_COMMUNITY)
Admission: RE | Admit: 2023-07-28 | Discharge: 2023-07-28 | Disposition: A | Discharge status: Home / Self Care | Payer: BC Managed Care – PPO | Source: Ambulatory Visit | Attending: Obstetrics & Gynecology | Admitting: Obstetrics & Gynecology

## 2023-07-28 VITALS — BP 120/95 | HR 71 | Ht 63.0 in | Wt 128.6 lb

## 2023-07-28 DIAGNOSIS — N946 Dysmenorrhea, unspecified: Secondary | ICD-10-CM

## 2023-07-28 DIAGNOSIS — Z01419 Encounter for gynecological examination (general) (routine) without abnormal findings: Secondary | ICD-10-CM | POA: Diagnosis not present

## 2023-07-28 DIAGNOSIS — Z124 Encounter for screening for malignant neoplasm of cervix: Secondary | ICD-10-CM | POA: Insufficient documentation

## 2023-07-28 DIAGNOSIS — K069 Disorder of gingiva and edentulous alveolar ridge, unspecified: Secondary | ICD-10-CM | POA: Diagnosis not present

## 2023-07-28 DIAGNOSIS — B009 Herpesviral infection, unspecified: Secondary | ICD-10-CM | POA: Diagnosis not present

## 2023-07-28 NOTE — Progress Notes (Signed)
47 y.o. G0P0000 Single White or Caucasian female here for annual exam.  Doing well.  Has pulmonary nodule that is being followed by Dr. Maple Hudson.  Has follow up scheduled.  Will have CT repeated 6 -12 months from now.  Cycles are more frequent, about every 24 days.  Flow lasts about 4-5 days.  Doesn't want to be on any hormonal therapy.    Patient's last menstrual period was 07/25/2023.          Sexually active: No.  The current method of family planning is none.    Exercising: play softball 2-3 times weekly Smoker:  no  Health Maintenance: Pap:  2021 History of abnormal Pap:  no MMG:  scheduled 08/18/2022 Colonoscopy:  07/2022, follow up 10 years Screening Labs: done with PCP   reports that she has never smoked. She has never been exposed to tobacco smoke. She has never used smokeless tobacco. She reports that she does not drink alcohol and does not use drugs.  Past Medical History:  Diagnosis Date   Allergic rhinitis    Allergy    Asthma    Bronchitis    1-2 episodes yearly   Environmental allergies    rash,swelling in throat,itching   Headache(784.0)    Kidney stone    Laryngitis    2-3episodes yearly    Past Surgical History:  Procedure Laterality Date   bulging disc C3 C4  2006   CYSTOSCOPY/URETEROSCOPY/HOLMIUM LASER/STENT PLACEMENT Right 03/24/2020   Procedure: CYSTOSCOPY/URETEROSCOPY/HOLMIUM LASER/STENT PLACEMENT, RETROGRADE PYLEGRAM.;  Surgeon: Noel Christmas, MD;  Location: [facility];  Service: Urology;  Laterality: Right;   EUS  10/13/2012   Procedure: UPPER ENDOSCOPIC ULTRASOUND (EUS) LINEAR;  Surgeon: Theda Belfast, MD;  Location: [facility];  Service: Endoscopy;  Laterality: N/A;   NASAL SEPTUM SURGERY  11/01/2002   UPPER GASTROINTESTINAL ENDOSCOPY  11/13; 12/13   WISDOM TOOTH EXTRACTION      Current Outpatient Medications  Medication Sig Dispense Refill   cetirizine (ZYRTEC) 10 MG tablet Take 1 tablet (10 mg total) by mouth 2 (two) times daily as needed for  allergies (Can take an extra dose during flare ups.). 60 tablet 5   cholecalciferol (VITAMIN D3) 25 MCG (1000 UNIT) tablet Take 1,000 Units by mouth daily.     levalbuterol (XOPENEX HFA) 45 MCG/ACT inhaler Inhale 1-2 puffs into the lungs every 6 (six) hours as needed for wheezing or shortness of breath. prn 1 each 12   metroNIDAZOLE (METROGEL) 0.75 % gel Apply 1 application topically 2 (two) times daily.     Multiple Vitamin (MULTIVITAMIN WITH MINERALS) TABS tablet Take 1 tablet by mouth daily.     valACYclovir (VALTREX) 1000 MG tablet Take 2 tabs and repeat in 12 hours.  Start with onset of symptoms. 30 tablet 5   No current facility-administered medications for this visit.    Family History  Problem Relation Age of Onset   Breast cancer Mother    Hypertension Father    Cancer Maternal Grandmother    Cancer Maternal Grandfather        skin cancer   Heart attack Maternal Grandfather    Emphysema Maternal Grandfather    Parkinsonism Paternal Grandmother    Heart attack Paternal Grandfather    Diabetes Other    Heart disease Other     ROS: Constitutional: negative Genitourinary:negative  Exam:   BP (!) 120/95 (BP Location: Left Arm, Patient Position: Sitting, Cuff Size: Normal)   Pulse 71   Ht 5\' 3"  (1.6  m)   Wt 128 lb 9.6 oz (58.3 kg)   LMP 07/25/2023   BMI 22.78 kg/m   Height: 5\' 3"  (160 cm)  General appearance: alert, cooperative and appears stated age Head: Normocephalic, without obvious abnormality, atraumatic Neck: no adenopathy, supple, symmetrical, trachea midline and thyroid normal to inspection and palpation Lungs: clear to auscultation bilaterally Breasts: normal appearance, no masses or tenderness Heart: regular rate and rhythm Abdomen: soft, non-tender; bowel sounds normal; no masses,  no organomegaly Extremities: extremities normal, atraumatic, no cyanosis or edema Skin: Skin color, texture, turgor normal. No rashes or lesions Lymph nodes: Cervical,  supraclavicular, and axillary nodes normal. No abnormal inguinal nodes palpated Neurologic: Grossly normal   Pelvic: External genitalia:  no lesions              Urethra:  normal appearing urethra with no masses, tenderness or lesions              Bartholins and Skenes: normal                 Vagina: normal appearing vagina with normal color and no discharge, no lesions              Cervix: no lesions              Pap taken: Yes.   Bimanual Exam:  Uterus:  normal size, contour, position, consistency, mobility, non-tender              Adnexa: normal adnexa and no mass, fullness, tenderness               Rectovaginal: Confirms               Anus:  normal sphincter tone, no lesions  Chaperone, Hendricks Milo, CMA, was present for exam.  Assessment/Plan: 1. Well woman exam with routine gynecological exam - Pap smear and HR HPV obtained today - Mammogram scheduled - Colonoscopy 01/22/2022 - lab work done with PCP, Dr. Corky Downs - vaccines reviewed/updated  2. Cervical cancer screening - Cytology - PAP( [facility])  3. 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 Dysmenorrhea

## 2023-08-04 LAB — CYTOLOGY - PAP
Comment: NEGATIVE
Diagnosis: NEGATIVE
High risk HPV: NEGATIVE

## 2023-08-19 ENCOUNTER — Ambulatory Visit
Admission: RE | Admit: 2023-08-19 | Discharge: 2023-08-19 | Disposition: A | Discharge status: Home / Self Care | Payer: BC Managed Care – PPO | Source: Ambulatory Visit | Attending: Obstetrics & Gynecology | Admitting: Obstetrics & Gynecology

## 2023-08-19 DIAGNOSIS — Z1231 Encounter for screening mammogram for malignant neoplasm of breast: Secondary | ICD-10-CM

## 2023-08-29 ENCOUNTER — Encounter: Payer: Self-pay | Admitting: Internal Medicine

## 2023-09-05 NOTE — Progress Notes (Unsigned)
Patient ID: Morgan Wood, female    DOB: 1976-07-06, 47 y.o.   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 CT report reviewed and management of nodules discussed. Probably benign. She is comfortable with suggestion to f/u next time with CXR, which would alert [place] if anything is growing- to spare cost and radiation. Incidental "dry skin" on forehead and with some pruritus and excoriation on R 4th finger suggestive of eczema. She will get flu vax from PCP. CT chest 07/08/22-  IMPRESSION: Stable appearance of small bilateral pulmonary nodules measuring up to 6 mm. If patient is high risk for malignancy, recommend an additional non-contrast Chest CT at 18-24 months (from 11/03/2021); if patient is low risk for malignancy a non-contrast Chest CT at 18-24 months is optional. These guidelines do not apply to immunocompromised patients and patients with cancer. Follow up in patients with significant comorbidities as clinically warranted. For lung cancer screening, adhere to Lung-RADS guidelines. Reference: Radiology. 2017; 284(1):228-43.  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 Occasional minimal wheeze, infrequent use of rescue inhaler. Has not needed maintenance. In past 2 weeks nasal congestion, some yellow green. Mild HA, no fever or sore throat. We discussed option and will refill Xopenex, send Zpak. Using nasal rinse. Flonase caused throat irritation. Lung nodule f/u dicussed- will update CT in 6 months.   ROS-see HPI + = positive Constitutional:   No-   weight loss, night sweats, +fevers, chills, fatigue, lassitude. HEENT:   + headaches, difficulty swallowing, tooth/dental problems, +sore throat,       No-  sneezing, itching, ear ache, +nasal congestion, +post nasal drip,  CV:  No-   chest pain, orthopnea, PND, swelling in lower extremities, anasarca, dizziness, palpitations Resp: No-   shortness of breath with exertion or at rest.              productive cough,  + non-productive cough,  No- coughing up of blood.              No-   change in color of mucus.  No- wheezing.   Skin: No-   rash or lesions. GI:  No-   heartburn, indigestion, abdominal pain, nausea, vomiting,  GU: . MS:  No-   joint pain or swelling.   Neuro-     nothing unusual Psych:  No- change in mood or affect. No depression or anxiety.  No memory loss.  Objective:   Physical Exam amb wf nad General- Alert, Oriented, Affect-appropriate, Distress- none acute, slender Skin- rash-none, lesions- none, excoriation- none Lymphadenopathy- none Head- atraumatic            Eyes- Gross vision intact, PERRLA, conjunctivae clear secretions  Ears- Hearing, canals-normal, TMs clear            Nose-  turbinate edema, no-Septal dev, polyps, erosion, perforation             Throat- Mallampati II , mucosa clear , drainage- none, tonsils- present Neck- flexible , trachea midline, no stridor , thyroid nl, carotid no bruit Chest -  symmetrical excursion , unlabored           Heart/CV- RRR , no murmur , no gallop  , no rub, nl s1 s2                            JVD- trace/ fills from above , edema- none, stasis changes- none, varices- none           Lung- clear to P&A, wheeze- none, cough- none , dullness-none, rub- none           Chest wall-  Abd-  Soft, no tenderness, with particular attention to left upper quadrant Extrem- cyanosis- none, clubbing, none, atrophy- none, strength- nl

## 2023-09-06 ENCOUNTER — Encounter: Payer: Self-pay | Admitting: Internal Medicine

## 2023-09-06 ENCOUNTER — Ambulatory Visit: Payer: BC Managed Care – PPO | Admitting: Internal Medicine

## 2023-09-06 VITALS — BP 103/70 | HR 93 | Ht 63.0 in | Wt 123.9 lb

## 2023-09-06 DIAGNOSIS — Z23 Encounter for immunization: Secondary | ICD-10-CM

## 2023-09-06 DIAGNOSIS — J4520 Mild intermittent asthma, uncomplicated: Secondary | ICD-10-CM | POA: Diagnosis not present

## 2023-09-06 DIAGNOSIS — J301 Allergic rhinitis due to pollen: Secondary | ICD-10-CM

## 2023-09-06 DIAGNOSIS — R911 Solitary pulmonary nodule: Secondary | ICD-10-CM

## 2023-09-06 MED ORDER — AZITHROMYCIN 250 MG PO TABS: ORAL_TABLET | ORAL | 0 refills | Status: DC

## 2023-09-06 MED ORDER — LEVALBUTEROL TARTRATE 45 MCG/ACT IN AERO: 1.0000 | INHALATION_SPRAY | Freq: Four times a day (QID) | RESPIRATORY_TRACT | 12 refills | Status: DC | PRN

## 2023-09-06 NOTE — Assessment & Plan Note (Signed)
Likely benign Plan- Update CT chest at 18 months ( May, 2025)

## 2023-09-06 NOTE — Assessment & Plan Note (Signed)
Rhinitis exacerbation vs mild sinusitis Plan- Zpak, nasal saline rinse

## 2023-09-06 NOTE — Assessment & Plan Note (Signed)
Uncomplicated Plan- refill Xopenex hfa

## 2023-09-06 NOTE — Patient Instructions (Addendum)
Order- flu vax standard  CT chest no contrast in 6 months   dx lung nodules  Script sent for Zpak and for Xopenex refill

## 2023-09-13 ENCOUNTER — Encounter: Payer: Self-pay | Admitting: Internal Medicine

## 2023-09-13 NOTE — Telephone Encounter (Signed)
Please order amoxicillin 500 mg, # 14, 1 twice daiy

## 2023-09-14 MED ORDER — AMOXICILLIN 500 MG PO TABS: 500.0000 mg | ORAL_TABLET | Freq: Two times a day (BID) | ORAL | 0 refills | Status: DC

## 2024-01-10 ENCOUNTER — Ambulatory Visit: Payer: BC Managed Care – PPO | Admitting: Internal Medicine

## 2024-03-06 ENCOUNTER — Encounter: Payer: Self-pay | Admitting: Internal Medicine

## 2024-03-13 ENCOUNTER — Ambulatory Visit
Admission: RE | Admit: 2024-03-13 | Discharge: 2024-03-13 | Disposition: A | Discharge status: Home / Self Care | Source: Ambulatory Visit | Attending: Internal Medicine | Admitting: Internal Medicine

## 2024-03-13 DIAGNOSIS — R911 Solitary pulmonary nodule: Secondary | ICD-10-CM

## 2024-03-15 ENCOUNTER — Ambulatory Visit (HOSPITAL_BASED_OUTPATIENT_CLINIC_OR_DEPARTMENT_OTHER): Payer: Self-pay

## 2024-03-27 ENCOUNTER — Ambulatory Visit: Payer: Self-pay | Admitting: Internal Medicine

## 2024-03-28 ENCOUNTER — Telehealth: Payer: Self-pay

## 2024-03-28 NOTE — Telephone Encounter (Signed)
 Copied from CRM [id number]. Topic: Clinical - Medical Advice >> Mar 28, 2024  1:30 PM Hilton Lucky wrote: Reason for CRM: Following results relay, patient would like to know when she needs to be re-checked again.   I already handled this per results encounter. Im completing this CRM as I do not need a duplicated message.

## 2024-05-18 ENCOUNTER — Telehealth: Payer: Self-pay | Admitting: *Deleted

## 2024-05-18 NOTE — Telephone Encounter (Signed)
 LVM for pt to call office to let her know that we are cancelling her appointment on 05/31/24 due to provider not being in office that week.  We need to reschedule this if she calls back. Will also send a mychart message.

## 2024-05-22 NOTE — Telephone Encounter (Signed)
 Contacted pt and appt scheduled.

## 2024-05-24 ENCOUNTER — Other Ambulatory Visit: Payer: BC Managed Care – PPO

## 2024-05-24 ENCOUNTER — Other Ambulatory Visit: Payer: Self-pay | Admitting: *Deleted

## 2024-05-24 DIAGNOSIS — E782 Mixed hyperlipidemia: Secondary | ICD-10-CM

## 2024-05-24 DIAGNOSIS — J4520 Mild intermittent asthma, uncomplicated: Secondary | ICD-10-CM

## 2024-05-25 ENCOUNTER — Ambulatory Visit: Payer: Self-pay | Admitting: Family Medicine

## 2024-05-25 LAB — COMPREHENSIVE METABOLIC PANEL WITH GFR
ALT: 17 IU/L (ref 0–32)
AST: 20 IU/L (ref 0–40)
Albumin: 4.1 g/dL (ref 3.9–4.9)
Alkaline Phosphatase: 54 IU/L (ref 44–121)
BUN/Creatinine Ratio: 19 (ref 9–23)
BUN: 15 mg/dL (ref 6–24)
Bilirubin Total: 0.3 mg/dL (ref 0.0–1.2)
CO2: 21 mmol/L (ref 20–29)
Calcium: 9.6 mg/dL (ref 8.7–10.2)
Chloride: 105 mmol/L (ref 96–106)
Creatinine, Ser: 0.81 mg/dL (ref 0.57–1.00)
Globulin, Total: 2.8 g/dL (ref 1.5–4.5)
Glucose: 83 mg/dL (ref 70–99)
Potassium: 4.7 mmol/L (ref 3.5–5.2)
Sodium: 141 mmol/L (ref 134–144)
Total Protein: 6.9 g/dL (ref 6.0–8.5)
eGFR: 89 mL/min/1.73 (ref >59)

## 2024-05-25 LAB — CBC WITH DIFFERENTIAL/PLATELET
Basophils Absolute: 0.1 x10E3/uL (ref 0.0–0.2)
Basos: 1 %
EOS (ABSOLUTE): 0.2 x10E3/uL (ref 0.0–0.4)
Eos: 3 %
Hematocrit: 41.9 % (ref 34.0–46.6)
Hemoglobin: 13.6 g/dL (ref 11.1–15.9)
Immature Grans (Abs): 0 x10E3/uL (ref 0.0–0.1)
Immature Granulocytes: 0 %
Lymphocytes Absolute: 2.4 x10E3/uL (ref 0.7–3.1)
Lymphs: 31 %
MCH: 29.2 pg (ref 26.6–33.0)
MCHC: 32.5 g/dL (ref 31.5–35.7)
MCV: 90 fL (ref 79–97)
Monocytes Absolute: 0.9 x10E3/uL (ref 0.1–0.9)
Monocytes: 12 %
Neutrophils Absolute: 4.1 x10E3/uL (ref 1.4–7.0)
Neutrophils: 53 %
Platelets: 275 x10E3/uL (ref 150–450)
RBC: 4.65 x10E6/uL (ref 3.77–5.28)
RDW: 12.9 % (ref 11.7–15.4)
WBC: 7.7 x10E3/uL (ref 3.4–10.8)

## 2024-05-25 LAB — LIPID PANEL
Chol/HDL Ratio: 3.3 ratio (ref 0.0–4.4)
Cholesterol, Total: 177 mg/dL (ref 100–199)
HDL: 53 mg/dL (ref >39)
LDL Chol Calc (NIH): 104 mg/dL — ABNORMAL HIGH (ref 0–99)
Triglycerides: 114 mg/dL (ref 0–149)
VLDL Cholesterol Cal: 20 mg/dL (ref 5–40)

## 2024-05-31 ENCOUNTER — Encounter: Payer: BC Managed Care – PPO | Admitting: Family Medicine

## 2024-06-08 ENCOUNTER — Encounter: Payer: Self-pay | Admitting: Family Medicine

## 2024-06-08 ENCOUNTER — Ambulatory Visit (INDEPENDENT_AMBULATORY_CARE_PROVIDER_SITE_OTHER): Admitting: Family Medicine

## 2024-06-08 VITALS — BP 96/65 | HR 69 | Ht 63.0 in | Wt 128.0 lb

## 2024-06-08 DIAGNOSIS — Z23 Encounter for immunization: Secondary | ICD-10-CM | POA: Diagnosis not present

## 2024-06-08 DIAGNOSIS — B009 Herpesviral infection, unspecified: Secondary | ICD-10-CM

## 2024-06-08 DIAGNOSIS — K219 Gastro-esophageal reflux disease without esophagitis: Secondary | ICD-10-CM

## 2024-06-08 DIAGNOSIS — K069 Disorder of gingiva and edentulous alveolar ridge, unspecified: Secondary | ICD-10-CM | POA: Diagnosis not present

## 2024-06-08 DIAGNOSIS — J4520 Mild intermittent asthma, uncomplicated: Secondary | ICD-10-CM

## 2024-06-08 DIAGNOSIS — Z0000 Encounter for general adult medical examination without abnormal findings: Secondary | ICD-10-CM

## 2024-06-08 MED ORDER — VALACYCLOVIR HCL 1 G PO TABS: ORAL_TABLET | ORAL | 5 refills | Status: AC

## 2024-06-08 MED ORDER — LEVALBUTEROL TARTRATE 45 MCG/ACT IN AERO: 1.0000 | INHALATION_SPRAY | Freq: Four times a day (QID) | RESPIRATORY_TRACT | 12 refills | Status: AC | PRN

## 2024-06-08 NOTE — Assessment & Plan Note (Signed)
 Presents for a routine physical exam. Reports are up to date on all age-appropriate screenings, including Pap smear, mammogram, and colonoscopy. Last labs showed borderline elevated cholesterol, which is being managed with diet and exercise. Exam today is unremarkable. - Continue current lifestyle modifications. - Labs deferred at this time. - Follow up in 1 year for annual exam, or sooner as needed.

## 2024-06-08 NOTE — Patient Instructions (Signed)
 It was nice to see you today,  We addressed the following topics today: -I have sent in your 2 prescriptions for the inhaler and valacyclovir . - Overall your labs were good.  Your cholesterol was slightly elevated but still not concerning.  Continue regular exercise and healthy diet limiting saturated fat.  Have a great day,  Rolan Slain, MD

## 2024-06-08 NOTE — Assessment & Plan Note (Signed)
 History of recurrent fever blisters, typically after sun exposure. Uses Valacyclovir  as needed for outbreaks and feels one starting. - Refill Valacyclovir . - Continue to use as needed for outbreaks.

## 2024-06-08 NOTE — Assessment & Plan Note (Signed)
 Reports intermittent heartburn/indigestion, which has occurred more this week. Associates with chocolate or work environment. Has used Tums with relief. Open to trying OTC options like Pepcid  or Prilosec if symptoms are more consistent. - Counselled on use of intermittent OTC H2 blockers (Pepcid ) or PPIs (Prilosec) if symptoms persist. - Follow up if symptoms worsen or become more frequent.

## 2024-06-08 NOTE — Progress Notes (Signed)
 Annual physical  Subjective    Patient ID: Morgan Wood, female    DOB: [birth year]  Age: 48 y.o. MRN: [id number]  Chief Complaint  Patient presents with   Annual Exam   HPI Morgan Wood is a 48 y.o. old female here  for annual exam.   Subjective - Here for annual physical. No new concerns at the start of the visit. - Reports recent onset of indigestion and heartburn this week. Associates onset with being back at school or possibly excessive chocolate intake. Used Tums for symptoms. - Reports left knee pain from a previous softball injury has resolved. Has resumed playing without pain this season. - Reports feeling a fever blister coming on after sun exposure, which is typical. Took Valacyclovir .  Medications Currently taking Zyrtec  for allergies, Albuterol  as needed for asthma, xopanex for asthma, and a multivitamin with vitamin D . Takes Valacyclovir  as needed for fever blisters. Is out of the metronidazole gel prescribed by dermatology for skin irritation. Reports using Albuterol  more frequently lately due to environmental triggers in a new school building.  PMH, PSH, FH, Social Hx PMHx: Asthma, allergies, herpes simplex (fever blisters), skin irritation, history of elevated cholesterol, and history of left knee injury. PSHx: Colonoscopy and endoscopy were normal, next due in 10 years. Screening: Pap smears and mammograms are up to date. 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 Recently returned to a new temporary building while the old one is being rebuilt.  ROS GI: Positive for recent indigestion and heartburn. Denies other GI symptoms. MSK: Denies current knee pain. RESP: Positive for intermittent wheezing, relieved with Albuterol . SKIN: Positive for recent pre-symptomatic fever blister.  The 10-year ASCVD risk score (Arnett DK, et al., 2019) is: 0.6%  Health Maintenance Due  Topic Date Due   Pneumococcal Vaccine: 19-49 Years (1 of 2 - PCV) Never  done   Hepatitis B Vaccines (1 of 3 - 19+ 3-dose series) Never done   COVID-19 Vaccine (4 - 2024-25 season) 07/03/2023   INFLUENZA VACCINE  06/01/2024      Objective:     BP 96/65   Pulse 69   Ht 5' 3 (1.6 m)   Wt 128 lb (58.1 kg)   LMP 05/25/2024   SpO2 100%   BMI 22.67 kg/m    Physical Exam Gen: alert, oriented Heent: perrla, eomi Cv: rrr Pulm: lctab Gi: soft, nbs.  Nontender Msk: strength equal b/l Skin: warm and dry Psych: pleasant affect   No results found for any visits on 06/08/24.      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 Uses Valacyclovir  as needed for outbreaks and feels one starting. - Refill Valacyclovir . - Continue to use as needed for outbreaks.  Orders: -     valACYclovir  HCl; Take 2 tabs and repeat in 12 hours.  Start with onset of symptoms.  Dispense: 30 tablet; Refill: 5  Gastroesophageal reflux disease without esophagitis Assessment & Plan: Reports intermittent heartburn/indigestion, which has occurred more this week. Associates with chocolate or work environment. Has used Tums with relief. Open to trying OTC options like Pepcid  or Prilosec if symptoms are more consistent. - Counselled on use of intermittent OTC H2 blockers (Pepcid ) or PPIs (Prilosec) if symptoms persist. - Follow up if symptoms worsen or become more frequent.   Asthma, mild intermittent,  well-controlled Assessment & Plan: History of asthma, well-controlled on xopanex prn. Reports increased use of xopanex recently due to environmental triggers at work in a new building. Lungs are clear on exam. - Refill xopanex inhaler. - Advised to continue using xopanex for wheezing as directed by pulmonologist.   Healthcare maintenance Assessment & Plan: Presents for a routine physical exam. Reports are up to  date on all age-appropriate screenings, including Pap smear, mammogram, and colonoscopy. Last labs showed borderline elevated cholesterol, which is being managed with diet and exercise. Exam today is unremarkable. - Continue current lifestyle modifications. - Labs deferred at this time. - Follow up in 1 year for annual exam, or sooner as needed.   Immunization due -     Tdap vaccine greater than or equal to 7yo IM  Other orders -     Levalbuterol  Tartrate; Inhale 1-2 puffs into the lungs every 6 (six) hours as needed for wheezing or shortness of breath. prn  Dispense: 1 each; Refill: 12     Return in about 1 year (around 06/08/2025) for physical.    Toribio MARLA Slain, MD

## 2024-06-08 NOTE — Assessment & Plan Note (Addendum)
 History of asthma, well-controlled on xopanex prn. Reports increased use of xopanex recently due to environmental triggers at work in a new building. Lungs are clear on exam. - Refill xopanex inhaler. - Advised to continue using xopanex for wheezing as directed by pulmonologist.

## 2024-07-18 ENCOUNTER — Other Ambulatory Visit: Payer: Self-pay | Admitting: Obstetrics & Gynecology

## 2024-07-18 DIAGNOSIS — Z1231 Encounter for screening mammogram for malignant neoplasm of breast: Secondary | ICD-10-CM

## 2024-07-30 ENCOUNTER — Encounter (HOSPITAL_BASED_OUTPATIENT_CLINIC_OR_DEPARTMENT_OTHER): Payer: Self-pay | Admitting: Obstetrics & Gynecology

## 2024-07-30 ENCOUNTER — Ambulatory Visit (HOSPITAL_BASED_OUTPATIENT_CLINIC_OR_DEPARTMENT_OTHER): Payer: BC Managed Care – PPO | Admitting: Obstetrics & Gynecology

## 2024-07-30 VITALS — BP 120/82 | HR 70 | Ht 63.0 in | Wt 131.0 lb

## 2024-07-30 DIAGNOSIS — B001 Herpesviral vesicular dermatitis: Secondary | ICD-10-CM

## 2024-07-30 DIAGNOSIS — Z01419 Encounter for gynecological examination (general) (routine) without abnormal findings: Secondary | ICD-10-CM

## 2024-07-30 DIAGNOSIS — N946 Dysmenorrhea, unspecified: Secondary | ICD-10-CM

## 2024-07-30 DIAGNOSIS — N920 Excessive and frequent menstruation with regular cycle: Secondary | ICD-10-CM | POA: Diagnosis not present

## 2024-07-30 NOTE — Progress Notes (Signed)
 ANNUAL EXAM Patient name: Morgan Wood MRN [id number]  Date of birth: October 09, 1976 Chief Complaint:   AEX, no complaints.    History of Present Illness:   Morgan Wood is a 48 y.o. G0P0000 Caucasian female being seen today for a routine annual exam.  Cycles are still regular and about every 21 days.  Flow lasts 3-4 days.  She has gained weight this past year.  She is walking and playing softball.    Father has a brain tumor and did have surgery.  He has not had to have any adjuvant therapy.  This affected his walking.  Language is good.  Has some mild memory issues.     Last pap 07/28/2023. Results were: NILM w/ HRHPV negative. H/O abnormal pap: yes Last mammogram: 08/19/2023. Results were: normal. Pt is scheduled for next. Mammogram on 08/21/2023. Family h/o breast cancer: yes mother. Last colonoscopy: 01/22/2022. Results were: normal. Family h/o colorectal cancer: no      07/30/2024    3:10 PM 06/08/2024   10:33 AM 07/28/2023    3:31 PM 06/01/2023    1:27 PM 07/08/2022    3:36 PM  Depression screen PHQ 2/9  Decreased Interest 0 0 0 0 0  Down, Depressed, Hopeless 0 0 0 0 0  PHQ - 2 Score 0 0 0 0 0  Altered sleeping  0     Tired, decreased energy  0     Change in appetite  0     Feeling bad or failure about yourself   0     Trouble concentrating  0     Moving slowly or fidgety/restless  0     Suicidal thoughts  0     PHQ-9 Score  0     Difficult doing work/chores  Not difficult at all           07/01/2021    3:54 PM 06/15/2021    1:31 PM 11/08/2017    3:40 PM  GAD 7 : Generalized Anxiety Score  Nervous, Anxious, on Edge 0 0 0  Control/stop worrying 0 0 0  Worry too much - different things 0 0 1  Trouble relaxing 0 0 0  Restless 0 0 0  Easily annoyed or irritable 0 0 0  Afraid - awful might happen 0 0 0  Total GAD 7 Score 0 0 1  Anxiety Difficulty   Not difficult at all     Review of Systems:   Pertinent items are noted in HPI Denies any urinary or bowel  issues.  Denies pelvic pain.   Pertinent History Reviewed:  Reviewed past medical,surgical, social and family history.  Reviewed problem list, medications and allergies. Physical Assessment:   Vitals:   07/30/24 1507  BP: 120/82  Pulse: 70  SpO2: 100%  Weight: 131 lb (59.4 kg)  Height: 5' 3 (1.6 m)  Body mass index is 23.21 kg/m.        Physical Examination:   General appearance - well appearing, and in no distress  Mental status - alert, oriented to person, place, and time  Psych:  She has a normal mood and affect  Skin - warm and dry, normal color, no suspicious lesions noted  Chest - effort normal, all lung fields clear to auscultation bilaterally  Heart - normal rate and regular rhythm  Neck:  midline trachea, no thyromegaly or nodules  Breasts - breasts appear normal, no suspicious masses, no skin or nipple changes or  axillary nodes  Abdomen - soft, nontender, nondistended, no masses or organomegaly  Pelvic - VULVA: normal appearing vulva with no masses, tenderness or lesions   VAGINA: normal appearing vagina with normal color and discharge, no lesions   CERVIX: normal appearing cervix without discharge or lesions, no CMT  Thin prep pap is not indicated today  UTERUS: uterus is felt to be normal size, shape, consistency and nontender   ADNEXA: No adnexal masses or tenderness noted.  Rectal - normal rectal, good sphincter tone, no masses felt.  Extremities:  No swelling or varicosities noted  Chaperone present for exam  No results found for this or any previous visit (from the past 24 hours).  Assessment & Plan:  1. Well woman exam with routine gynecological exam (Primary) - Pap smear neg with neg HR HPV 2024 - Mammogram scheduled 08/20/2024 - Colonoscopy 2023.  Follow up 10 years.   - Bone mineral density guidelines reviewed - lab work done with PCP, Dr. Chandra - vaccines reviewed/updated  2. Polymenorrhea - discussed possible treatment options including POPs.   Declines  3. Fever blister - has valtrex  rx  4. Dysmenorrhea   No orders of the defined types were placed in this encounter.   Meds: No orders of the defined types were placed in this encounter.   Follow-up: Return in about 1 year (around 07/30/2025).  Ronal GORMAN Pinal, MD 07/30/2024 3:40 PM

## 2024-08-20 ENCOUNTER — Ambulatory Visit
Admission: RE | Admit: 2024-08-20 | Discharge: 2024-08-20 | Disposition: A | Source: Ambulatory Visit | Attending: Obstetrics & Gynecology | Admitting: Obstetrics & Gynecology

## 2024-08-20 DIAGNOSIS — Z1231 Encounter for screening mammogram for malignant neoplasm of breast: Secondary | ICD-10-CM

## 2024-09-05 NOTE — Progress Notes (Signed)
 Patient ID: Morgan Wood, female    DOB: 01-02-1976, 48 y.o.   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 Occasional minimal wheeze, infrequent use of rescue inhaler. Has not needed maintenance. In past 2 weeks nasal congestion, some yellow green. Mild HA, no fever or sore throat. We discussed option and will refill Xopenex , send Zpak. Using nasal rinse. Flonase  caused throat irritation. Lung nodule f/u dicussed- will update CT in 6 months.   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  Taking sudafed prn Suggested trial of saline sinus rinse. Use Flonase  as daily maintenance when needed. 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 Flu vax today. Denies asthma/ chest symptoms. Discussed her CT.  CT chest 03/13/24 IMPRESSION: Scattered bilateral parenchymal nodule stable in appearance from the prior exam. Given their long-term stability, no further follow-up is recommended. Aortic Atherosclerosis (ICD10-I70.0).     ROS-see HPI + = positive Constitutional:   No-   weight loss, night sweats, +fevers, chills, fatigue, lassitude. HEENT:   + headaches, difficulty swallowing, tooth/dental problems, +sore throat,       No-  sneezing, itching, ear ache, +nasal congestion, +post nasal drip,  CV:  No-   chest pain, orthopnea, PND, swelling in lower extremities, anasarca, dizziness, palpitations Resp: No-   shortness of breath with exertion or at rest.              productive cough,  + non-productive cough,  No- coughing up of blood.              No-   change in color of mucus.  No- wheezing.   Skin: No-   rash or lesions. GI:  No-   heartburn, indigestion, abdominal pain, nausea, vomiting,  GU: . MS:  No-   joint pain or swelling.   Neuro-     nothing unusual Psych:  No- change in mood or affect. No depression or anxiety.  No memory loss.  Objective:   Physical Exam amb wf nad General- Alert, Oriented, Affect-appropriate, Distress- none acute, slender Skin- rash-none, lesions- none, excoriation- none Lymphadenopathy- none Head- atraumatic            Eyes- Gross vision intact, PERRLA, conjunctivae clear secretions            Ears- Hearing, canals-normal, TMs clear            Nose-  turbinate edema, no-Septal dev, polyps, erosion, perforation             Throat- Mallampati II , mucosa clear , drainage- none, tonsils- present Neck- flexible , trachea midline, no stridor , thyroid nl, carotid no bruit Chest - symmetrical excursion , unlabored           Heart/CV- RRR ,  no murmur , no gallop  , no rub, nl s1 s2                            JVD- trace/ fills from above , edema- none, stasis changes- none, varices- none           Lung- clear to P&A, wheeze- none, cough- none , dullness-none, rub- none           Chest wall-  Abd-  Soft, no tenderness, with particular attention to left upper quadrant Extrem- cyanosis- none, clubbing, none, atrophy- none, strength- nl

## 2024-09-06 ENCOUNTER — Ambulatory Visit: Payer: BC Managed Care – PPO | Admitting: Internal Medicine

## 2024-09-06 ENCOUNTER — Encounter: Payer: Self-pay | Admitting: Internal Medicine

## 2024-09-06 VITALS — BP 120/79 | HR 82 | Temp 98.5°F | Ht 63.0 in | Wt 129.2 lb

## 2024-09-06 DIAGNOSIS — K219 Gastro-esophageal reflux disease without esophagitis: Secondary | ICD-10-CM

## 2024-09-06 DIAGNOSIS — J4520 Mild intermittent asthma, uncomplicated: Secondary | ICD-10-CM | POA: Diagnosis not present

## 2024-09-06 DIAGNOSIS — Z23 Encounter for immunization: Secondary | ICD-10-CM

## 2024-09-06 DIAGNOSIS — R911 Solitary pulmonary nodule: Secondary | ICD-10-CM

## 2024-09-06 DIAGNOSIS — R918 Other nonspecific abnormal finding of lung field: Secondary | ICD-10-CM | POA: Diagnosis not present

## 2024-09-06 DIAGNOSIS — J301 Allergic rhinitis due to pollen: Secondary | ICD-10-CM

## 2024-09-06 NOTE — Patient Instructions (Signed)
 Order- flu vax standard  Please call if we can help

## 2024-09-10 NOTE — Assessment & Plan Note (Signed)
 Remains mild, intermittent, uncomplicated Plan- rescue inhaler if needed

## 2024-09-10 NOTE — Assessment & Plan Note (Signed)
 To minimize sinusitis problems- Flonase  for maintenance use Nasal saline navage- [facility], etc

## 2025-06-03 ENCOUNTER — Other Ambulatory Visit

## 2025-06-10 ENCOUNTER — Encounter: Admitting: Family Medicine
